# Patient Record
Sex: Female | Born: 1985 | Race: Black or African American | Hispanic: No | Marital: Single | Smoking: Former smoker
Health system: Southern US, Community
[De-identification: ages and names within clinical notes are randomized; demographics above are authoritative.]

## PROBLEM LIST (undated history)

## (undated) ENCOUNTER — Inpatient Hospital Stay: Payer: Self-pay

## (undated) DIAGNOSIS — F329 Major depressive disorder, single episode, unspecified: Secondary | ICD-10-CM

## (undated) DIAGNOSIS — F32A Depression, unspecified: Secondary | ICD-10-CM

## (undated) DIAGNOSIS — O169 Unspecified maternal hypertension, unspecified trimester: Secondary | ICD-10-CM

## (undated) DIAGNOSIS — J45909 Unspecified asthma, uncomplicated: Secondary | ICD-10-CM

## (undated) HISTORY — PX: NO PAST SURGERIES: SHX2092

## (undated) HISTORY — PX: OTHER SURGICAL HISTORY: SHX169

---
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
## 2007-08-01 ENCOUNTER — Emergency Department: Payer: Self-pay | Admitting: Emergency Medicine

## 2008-09-04 ENCOUNTER — Emergency Department: Payer: Self-pay | Admitting: Emergency Medicine

## 2009-10-09 LAB — OB RESULTS CONSOLE VARICELLA ZOSTER ANTIBODY, IGG: Varicella: IMMUNE

## 2009-10-09 LAB — OB RESULTS CONSOLE RUBELLA ANTIBODY, IGM: Rubella: IMMUNE

## 2009-10-09 LAB — SICKLE CELL SCREEN: Sickle Cell Screen: NORMAL

## 2009-10-27 ENCOUNTER — Encounter: Payer: Self-pay | Admitting: Obstetrics and Gynecology

## 2009-11-24 ENCOUNTER — Encounter: Payer: Self-pay | Admitting: Maternal and Fetal Medicine

## 2010-02-02 ENCOUNTER — Encounter: Payer: Self-pay | Admitting: Maternal and Fetal Medicine

## 2010-04-02 ENCOUNTER — Observation Stay: Payer: Self-pay

## 2010-04-20 ENCOUNTER — Inpatient Hospital Stay: Payer: Self-pay

## 2010-11-11 ENCOUNTER — Emergency Department: Payer: Self-pay | Admitting: *Deleted

## 2010-11-11 ENCOUNTER — Emergency Department: Payer: Self-pay | Admitting: Emergency Medicine

## 2010-12-08 ENCOUNTER — Emergency Department: Payer: Self-pay | Admitting: Emergency Medicine

## 2011-09-17 ENCOUNTER — Emergency Department: Payer: Self-pay | Admitting: Unknown Physician Specialty

## 2011-09-17 LAB — PREGNANCY, URINE: Pregnancy Test, Urine: NEGATIVE m[IU]/mL

## 2011-09-17 LAB — COMPREHENSIVE METABOLIC PANEL
Albumin: 4.1 g/dL (ref 3.4–5.0)
Alkaline Phosphatase: 78 U/L (ref 50–136)
Anion Gap: 7 (ref 7–16)
BUN: 15 mg/dL (ref 7–18)
Calcium, Total: 8.8 mg/dL (ref 8.5–10.1)
Glucose: 78 mg/dL (ref 65–99)
SGOT(AST): 25 U/L (ref 15–37)
SGPT (ALT): 22 U/L
Total Protein: 8.4 g/dL — ABNORMAL HIGH (ref 6.4–8.2)

## 2011-09-17 LAB — CBC
HCT: 41.7 % (ref 35.0–47.0)
HGB: 13.6 g/dL (ref 12.0–16.0)
RBC: 5.04 10*6/uL (ref 3.80–5.20)
WBC: 8.3 10*3/uL (ref 3.6–11.0)

## 2011-09-17 LAB — URINALYSIS, COMPLETE
Bacteria: NONE SEEN
Bilirubin,UR: NEGATIVE
Blood: NEGATIVE
Glucose,UR: NEGATIVE mg/dL (ref 0–75)
Ketone: NEGATIVE
Leukocyte Esterase: NEGATIVE
Nitrite: NEGATIVE
Specific Gravity: 1.008 (ref 1.003–1.030)

## 2011-09-17 LAB — ETHANOL
Ethanol %: <0.003 % (ref 0.000–0.080)
Ethanol: <3 mg/dL

## 2011-09-27 ENCOUNTER — Emergency Department: Payer: Self-pay | Admitting: Emergency Medicine

## 2011-12-31 ENCOUNTER — Emergency Department: Payer: Self-pay | Admitting: Emergency Medicine

## 2011-12-31 LAB — URINALYSIS, COMPLETE
Bacteria: NONE SEEN
Glucose,UR: NEGATIVE mg/dL (ref 0–75)
Ketone: NEGATIVE
Specific Gravity: 1.012 (ref 1.003–1.030)
Squamous Epithelial: <1
WBC UR: 1 /HPF (ref 0–5)

## 2011-12-31 LAB — COMPREHENSIVE METABOLIC PANEL
Albumin: 4.1 g/dL (ref 3.4–5.0)
Alkaline Phosphatase: 73 U/L (ref 50–136)
Anion Gap: 7 (ref 7–16)
BUN: 9 mg/dL (ref 7–18)
Chloride: 110 mmol/L — ABNORMAL HIGH (ref 98–107)
Creatinine: 0.75 mg/dL (ref 0.60–1.30)
Glucose: 84 mg/dL (ref 65–99)
Osmolality: 281 (ref 275–301)
SGOT(AST): 21 U/L (ref 15–37)
SGPT (ALT): 19 U/L (ref 12–78)
Sodium: 142 mmol/L (ref 136–145)
Total Protein: 7.6 g/dL (ref 6.4–8.2)

## 2011-12-31 LAB — CBC
HCT: 40.7 % (ref 35.0–47.0)
HGB: 13.2 g/dL (ref 12.0–16.0)
MCH: 26.6 pg (ref 26.0–34.0)
MCHC: 32.5 g/dL (ref 32.0–36.0)
MCV: 82 fL (ref 80–100)
RDW: 14.4 % (ref 11.5–14.5)
WBC: 6.2 10*3/uL (ref 3.6–11.0)

## 2012-09-08 ENCOUNTER — Observation Stay: Payer: Self-pay | Admitting: Obstetrics and Gynecology

## 2012-09-08 LAB — PIH PROFILE
Anion Gap: 8 (ref 7–16)
BUN: 3 mg/dL — ABNORMAL LOW (ref 7–18)
Calcium, Total: 8.4 mg/dL — ABNORMAL LOW (ref 8.5–10.1)
Chloride: 108 mmol/L — ABNORMAL HIGH (ref 98–107)
Co2: 24 mmol/L (ref 21–32)
EGFR (Non-African Amer.): >60
Glucose: 93 mg/dL (ref 65–99)
HCT: 31.7 % — ABNORMAL LOW (ref 35.0–47.0)
HGB: 10.3 g/dL — ABNORMAL LOW (ref 12.0–16.0)
MCH: 26.1 pg (ref 26.0–34.0)
MCHC: 32.4 g/dL (ref 32.0–36.0)
MCV: 80 fL (ref 80–100)
Platelet: 167 10*3/uL (ref 150–440)
RBC: 3.95 10*6/uL (ref 3.80–5.20)
RDW: 14.3 % (ref 11.5–14.5)
Sodium: 140 mmol/L (ref 136–145)
WBC: 16.1 10*3/uL — ABNORMAL HIGH (ref 3.6–11.0)

## 2012-09-08 LAB — PROTEIN / CREATININE RATIO, URINE
Protein, Random Urine: 11 mg/dL (ref 0–12)
Protein/Creat. Ratio: 157 mg/gCREAT (ref 0–200)

## 2012-09-11 ENCOUNTER — Inpatient Hospital Stay: Payer: Self-pay | Admitting: Obstetrics and Gynecology

## 2012-09-11 LAB — CBC WITH DIFFERENTIAL/PLATELET
Basophil #: 0.1 10*3/uL (ref 0.0–0.1)
Basophil %: 0.5 %
Eosinophil #: 0.1 10*3/uL (ref 0.0–0.7)
Eosinophil %: 0.4 %
HCT: 32.6 % — ABNORMAL LOW (ref 35.0–47.0)
HGB: 10.7 g/dL — ABNORMAL LOW (ref 12.0–16.0)
Lymphocyte #: 2.3 10*3/uL (ref 1.0–3.6)
Lymphocyte %: 15.1 %
MCH: 26.2 pg (ref 26.0–34.0)
MCHC: 32.7 g/dL (ref 32.0–36.0)
MCV: 80 fL (ref 80–100)
Monocyte #: 1.2 x10 3/mm — ABNORMAL HIGH (ref 0.2–0.9)
Monocyte %: 7.9 %
Neutrophil #: 11.5 10*3/uL — ABNORMAL HIGH (ref 1.4–6.5)
Neutrophil %: 76.1 %
Platelet: 163 10*3/uL (ref 150–440)
RBC: 4.06 10*6/uL (ref 3.80–5.20)
RDW: 14 % (ref 11.5–14.5)
WBC: 15.1 10*3/uL — ABNORMAL HIGH (ref 3.6–11.0)

## 2012-09-11 LAB — COMPREHENSIVE METABOLIC PANEL
Albumin: 2.7 g/dL — ABNORMAL LOW (ref 3.4–5.0)
Anion Gap: 8 (ref 7–16)
Bilirubin,Total: 0.2 mg/dL (ref 0.2–1.0)
Chloride: 108 mmol/L — ABNORMAL HIGH (ref 98–107)
Creatinine: 0.56 mg/dL — ABNORMAL LOW (ref 0.60–1.30)
Glucose: 79 mg/dL (ref 65–99)
Osmolality: 274 (ref 275–301)
Total Protein: 6.5 g/dL (ref 6.4–8.2)

## 2012-09-12 DIAGNOSIS — O139 Gestational [pregnancy-induced] hypertension without significant proteinuria, unspecified trimester: Secondary | ICD-10-CM

## 2012-09-13 LAB — HEMATOCRIT: HCT: 26.8 % — ABNORMAL LOW (ref 35.0–47.0)

## 2013-09-07 ENCOUNTER — Emergency Department: Payer: Self-pay | Admitting: Emergency Medicine

## 2014-02-15 NOTE — L&D Delivery Note (Signed)
Deliver Note   Date of Delivery:   10/16/2014 Primary OB:   [facility] Gestational Age/EDD: [redacted]w[redacted]d by 11/02/2014, by Ultrasound  Antepartum complications:  OB History    Gravida Para Term Preterm AB TAB SAB Ectopic Multiple Living   0 0 0 0 0 0 2      Delivered By:   Vena Austria MD  Delivery Type:   TSVD Anesthesia:     epidural  Intrapartum complications:  GBS:    Negative Laceration:    None Episiotomy:    none Placenta:    Spontaneous Estimated Blood Loss:  Baby:     Liveborn female  APGAR (1 MIN):  8 APGAR (5 MINS):  9 weight pending   Deliver Details   IOL for worsening CHTN and borderline/declining AFI of 6cm.   At 22:59 female was delivered via TSVD (Presentation: OA ).  APGAR: 8, 9; weight pending.   Placenta status: spontaneous, intact.  Cord: 3 vessel without complications: Marland Kitchen    Mom to postpartum.  Baby to Couplet care / Skin to Skin.

## 2014-03-26 LAB — HM PAP SMEAR: HM Pap smear: NEGATIVE

## 2014-03-26 LAB — OB RESULTS CONSOLE RPR: RPR: NONREACTIVE

## 2014-03-26 LAB — OB RESULTS CONSOLE VARICELLA ZOSTER ANTIBODY, IGG: Varicella: IMMUNE

## 2014-03-26 LAB — OB RESULTS CONSOLE ABO/RH: RH Type: POSITIVE

## 2014-03-26 LAB — OB RESULTS CONSOLE RUBELLA ANTIBODY, IGM: RUBELLA: IMMUNE

## 2014-03-26 LAB — OB RESULTS CONSOLE GC/CHLAMYDIA
Chlamydia: NEGATIVE
GC PROBE AMP, GENITAL: NEGATIVE

## 2014-03-26 LAB — OB RESULTS CONSOLE HIV ANTIBODY (ROUTINE TESTING): HIV: NONREACTIVE

## 2014-03-26 LAB — OB RESULTS CONSOLE HEPATITIS B SURFACE ANTIGEN: Hepatitis B Surface Ag: NEGATIVE

## 2014-03-27 ENCOUNTER — Ambulatory Visit: Payer: Self-pay | Admitting: Family Medicine

## 2014-04-12 DIAGNOSIS — Z6831 Body mass index (BMI) 31.0-31.9, adult: Secondary | ICD-10-CM | POA: Insufficient documentation

## 2014-04-12 DIAGNOSIS — E669 Obesity, unspecified: Secondary | ICD-10-CM | POA: Insufficient documentation

## 2014-04-22 ENCOUNTER — Encounter: Payer: Self-pay | Admitting: Obstetrics and Gynecology

## 2014-04-30 ENCOUNTER — Emergency Department: Payer: Self-pay | Admitting: Emergency Medicine

## 2014-05-27 ENCOUNTER — Encounter
Admit: 2014-05-27 | Disposition: A | Discharge status: Home / Self Care | Payer: Self-pay | Attending: Maternal & Fetal Medicine | Admitting: Maternal & Fetal Medicine

## 2014-06-16 NOTE — Consult Note (Signed)
Referral Information:  Reason for Referral pregnant with h/o chronic hypertension, asthma, smoker, anxiety, elevated BMI, h/o pp depression   Referring Physician [facility]   Prenatal Hx 29 yo G3 P2002 [facility] in a committed 10 year relationship pregnant with their third baby now   Past Obstetrical Hx 2012 - spontaneous vaginal delivery 7 lbs - postpartum anxiety and depression required ER visit and meds  2014 spontaneous vaginal delivery 7 lbs induced for hypertension required daily medicaiton for about 3 months then stopped , no postpartum depression   Home Medications: Medication Instructions Status  albuterol CFC free 90 mcg/inh inhalation aerosol  inhaled , As Needed - for Wheezing Active  Pulmicort Flexhaler 90 mcg/inh inhalation powder 1 puff(s) inhaled 2 times a day Active  multivitamin, prenatal 1   once a day Active  Zantac 150 150 mg oral tablet 1  orally once a day (at bedtime) Active   Allergies:   No Known Allergies:   Vital Signs/Notes:  Nursing Vital Signs: **Vital Signs.:   07-Mar-16 09:01  Vital Signs Type Routine  Temperature Temperature (F) 98.4  Celsius 36.8  Pulse Pulse 109  Respirations Respirations 18  Systolic BP Systolic BP 129  Diastolic BP (mmHg) Diastolic BP (mmHg) 76  Mean BP 93  Pulse Ox % Pulse Ox % 99  Pulse Ox Activity Level  At rest  Oxygen Delivery Room Air/ 21 %   Perinatal Consult:  LMP 14-Jan-2014   Past Medical History cont'd 1) Hypertension first occurred during late in her second pregnancy required pp meds (unsure which one took once daily) noted BP bounces around 2) GERD bad in pregnancy - using Zantac now with relief unsure if it aggravates her asthma 3) Asthma since she was little - using inhaler since she was 8 , worse in pregnancy , QVAR has helped her , rarely using albuterol , got flu shot- unsure about pneumovax  4) Anxiety - got bad after last delivery - unable to sleep , frightened to leave her house - she recognized she couldn't  go on like tha and went to ER - Started Klonopin and PAxil switched to Xanax since too sleepy and was eventually able to stop 5 ) Smoker  3/day only buys packs no cartons , partner smokes , considering quitting  6) obesity elevated BMI 35, watching her intake   PSurg Hx negative   FHx metabolic disorders either side   Occupation Mother works at [employer] [address]   Occupation Father together x 10 years , he smokes   Soc Hx single, tobacco, in committed relaitonship   Review Of Systems:  Subjective c/o stuffy nose and dry mouth - worse while pregnant, headaches - occ related to sinus issues   SOB/DOE No    Tolerating Diet Yes    Medications/Allergies Reviewed Medications/Allergies reviewed     Additional Lab/Radiology Notes brief u/s confirmed FHR   Impression/Recommendations:  Impression IUP at 13 5/7 1- Chronic HTN off meds now started after gestational HTN last delivery 2-Asthma better on QVAR, occ albuterol, c/o nasal congestion unable to clear nose occ related to HA , allergic component ,  3-GERD  better on Zantac , unsure if reflux aggravates her asthma 4 -Smoker - light - encouraged her to quit  5 significant h/o postpartum depression and anxiety after first child, managed with second - pt reports recognizing when "things are getting  bad' and knows when she shoudl get help  6 obesity - BMI 35 - pt limiting intake  Recommendations 1-recommended daily baby aspirin 81 mg for her h/o htn to prevent preeclampsia - if aggravates asthma or GERd then d/c  2 - Continue QVAR and Albuterol . I suggested seeing ENT given nasal sxs and HAs to r/o polyp - I suggested use of a Netti pot and flonase if ENT eval negative  3- continue Zantac  4 - pp f/u for depression /anxiety given significant episode after first delivery 5 limit weight gain to 10-15 pounds  6 monthly u/s in third trimester , induction at 39 weeks for h/o HTN , no meds now normotensive - if persistent  elevation above 140/90 and remote form term consider initiation of medicaiton - theoretically some individuals may have asthma exacerbation with labetalol - consider use of procardia if occurs   Plan:  Genetic Counseling no   Prenatal Diagnosis Options Level II US, ordered in 5 weeks   Ultrasound at what gestational ages Monthly > 28 weeks   Antepartum Testing Weekly, Starting at 3034 to 36 weeks   Delivery Mode Vaginal   Delivery at what gestational age [redacted] weeks    Total Time Spent with Patient 30 minutes   >50% of visit spent in couseling/coordination of care yes   Office Use Only 99242  Level 2 (30min) NEW office consult exp prob focused   Coding Description: MATERNAL CONDITIONS/HISTORY INDICATION(S).   Chronic HTN.   HTN - Chronic.   Tobacco use complicating pregnancy.  Electronic Signatures: Rondall AllegraLivingston, Raizy Auzenne Gresham (MD)  (Signed 07-Mar-16 12:16)  Authored: Referral, Home Medications, Allergies, Vital Signs/Notes, Consult, Exam, Lab/Radiology Notes, Impression, Plan, Billing, Coding Description   Last Updated: 07-Mar-16 12:16 by Rondall AllegraLivingston, Mckinnley Cottier Gresham (MD)

## 2014-06-16 NOTE — Consult Note (Signed)
Referral Information:  Reason for Referral 29 yo G3 P2002 at 4530w2d gestation with h/o chronic hypertension, asthma, smoker, anxiety, elevated BMI, h/o pp depression here for anatomy US today and follow-up regarding her BP and nasal congestion.  The patient continues to have unrelenting nasal congestion despite treatment.   Referring Physician [facility]   Past Obstetrical Hx 2012 - spontaneous vaginal delivery 7 lbs - postpartum anxiety and depression required ER visit and meds  2014 spontaneous vaginal delivery 7 lbs induced for hypertension required daily medicaiton for about 3 months then stopped , no postpartum depression   Home Medications: Medication Instructions Status  albuterol CFC free 90 mcg/inh inhalation aerosol  inhaled , As Needed - for Wheezing Active  Pulmicort Flexhaler 90 mcg/inh inhalation powder 1 puff(s) inhaled 2 times a day Active  multivitamin, prenatal 1   once a day Active  aspirin 81 mg oral tablet, chewable 1 tab(s) orally once a day Active   Allergies:   No Known Allergies:   Vital Signs/Notes:  Nursing Vital Signs: **Vital Signs.:   11-Apr-16 10:10  Vital Signs Type Routine  Temperature Temperature (F) 98.8  Celsius 37.1  Pulse Pulse 100  Respirations Respirations 18  Systolic BP Systolic BP 124  Diastolic BP (mmHg) Diastolic BP (mmHg) 70  Mean BP 88  Pulse Ox % Pulse Ox % 99  Pulse Ox Activity Level  At rest  Oxygen Delivery Room Air/ 21 %   Perinatal Consult:  LMP 14-Jan-2014   Past Medical History cont'd 1) Hypertension first occurred during late in her second pregnancy required pp meds (unsure which one took once daily) noted BP bounces around 2) GERD bad in pregnancy - using Zantac now with relief unsure if it aggravates her asthma 3) Asthma since she was little - using inhaler since she was 8 , worse in pregnancy , QVAR has helped her , rarely using albuterol , got flu shot- unsure about pneumovax  4) Anxiety - got bad after last delivery -  unable to sleep , frightened to leave her house - she recognized she couldn't go on like tha and went to ER - Started Klonopin and PAxil switched to Xanax since too sleepy and was eventually able to stop 5 ) Smoker  3/day only buys packs no cartons , partner smokes , considering quitting  6) obesity elevated BMI 35, watching her intake   PSurg Hx negative   FHx metabolic disorders either side   Occupation Mother Works at [employer] [address]   Occupation Father Together x 10 years, he smokes   Soc Hx single, tobacco, in committed relationship   Review Of Systems:  Subjective c/o stuffy nose and dry mouth - worse while pregnant, headaches - occ related to sinus issues   Fever/Chills No   Cough No   Abdominal Pain No   Diarrhea No   Constipation No   Nausea/Vomiting No   SOB/DOE No   Chest Pain No   Dysuria No   Tolerating Diet Yes   Medications/Allergies Reviewed Medications/Allergies reviewed   Exam:  Today's Weight 176lbs;BMI=33    Additional Lab/Radiology Notes US today - normal fetal anatomy, no previa, cx 4.0 cm   Impression/Recommendations:  Impression 29 yo G3 P2002 at 3530w2d gestation with:  1- Chronic HTN - BP stable off meds  2- Asthma stable, still c/o nasal congestion unable to clear nose  3-GERD  4 -Smoker - light  5- History of postpartum depression and anxiety 6- Obesity   Recommendations 1-  Chronic HTN - BP stable off meds  --  On ASA for prevention of preeclampsia --  For close monitoring of BPs --  Fetal surveillance as below --  Next [place] scheduled for growth at 28 weeks here at [facility] --  If the patient develops a persistent elevation above 140/90 and remote form term consider initiation of medication. Theoretically, some individuals may have asthma exacerbation with labetalol - consider use of Procardia. 2- Asthma stable, still c/o nasal congestion unable to clear nose  -- Dr. Leatha Gilding recommended the patient  be referred to ENT.  She can be seen at [facility] with referral from [facility] which we will secure today  3- GERD - continue Zantac  4 - Smoker - previously encouraged to quit 5- History of postpartum depression and anxiety - for postpartum referral and follow-up 6- Obesity - previously counseled to limit weight gain to 10-15 lbs; Fetal surveillance as outlined below.   Plan:  Genetic Counseling no   Prenatal Diagnosis Options Level II [place], today; see report   Ultrasound at what gestational ages Monthly > 28 weeks   Antepartum Testing Weekly, Starting at 37 to 65 weeks   Delivery Mode Vaginal   Delivery at what gestational age [redacted] weeks    Total Time Spent with Patient 30 minutes   >50% of visit spent in couseling/coordination of care yes   Office Use Only 99214  Office Visit Level 4 ( ) EST detailed office/outpt   Coding Description: MATERNAL CONDITIONS/HISTORY INDICATION(S).   Chronic HTN.   HTN - Chronic.   Tobacco use complicating pregnancy.  Electronic Signatures: Lady Deutscher (MD)  (Signed 11-Apr-16 11:36)  Authored: Referral, Home Medications, Allergies, Vital Signs/Notes, Consult, Exam, Lab/Radiology Notes, Impression, Plan, Billing, Coding Description   Last Updated: 11-Apr-16 11:36 by Lady Deutscher (MD)

## 2014-06-25 NOTE — H&P (Signed)
L&D Evaluation:  History Expanded:  HPI 29 yo G2P1001at 603w2d gestational age by 7 week ultrasound. She presents from [facility] today for elevated BPs 138/102 and 130/100.  She was also sent over from clinic on 7/25 to L&D for clinic BPs of 136/100 and 148/100.  On L&D 3 days ago her BPs were in the normal range and her preeclampsia labs were all normal.  She presents today with a 5-day history of headaches relieved by Tylenol.  She notes no visual disturbances and no RUQ pain. She notes positive fetal movement, denies leakage of fluid and vaginal bleeding. She notes occasional contractions, but is quite vague about the frequency of her contractions. Her pregnancy has been complicated by EtOH use, exercise-induced asthma.   Gravida 2   Term 1   PreTerm 0   Abortion 0   Living 1   Blood Type (Maternal) O positive   Group B Strep Results Maternal (Result >5wks must be treated as unknown) negative  08/31/12   Maternal HIV Negative   Maternal Syphilis Ab Nonreactive   Maternal Varicella Immune   Rubella Results (Maternal) immune   Maternal [facility]Dap Immune   [facility]   Patient's Medical History 1) Exercise induced asthma, 2) EtOH abuse   Patient's Surgical History none   Medications Pre Natal Vitamins   Allergies NKDA   Social History tobacco  EtOH   Family History Non-Contributory   ROS:  ROS All systems were reviewed.  HEENT, CNS, GI, GU, Respiratory, CV, Renal and Musculoskeletal systems were found to be normal., x10 reviewed unless noted in HPI   Exam:  Vital Signs BP 117-155/68-97, T98.41F (37C), P86, RR18   Urine Protein negative dipstick   General no apparent distress   Mental Status clear   Chest clear   Heart normal sinus rhythm   Abdomen gravid, non-tender   Estimated Fetal Weight appropriate for gestational age, 7.5lbs   Back no CVAT   Edema no edema   Reflexes 2+   Pelvic 3cm per [facility]   Mebranes Intact   FHT normal rate with no decels    FHT Description 135/mod var/+accels/no decels   Ucx none   Impression:  Impression Gestational hypertension at 8238 weeks gestation age   Plan:  Comments -admit for induction given gestational hypertension (see "Timing of Indicated late-Preterm and Early-Term Birth" Obstetrics and Gynecology, August 2011, Vol 118, No 2, part 1) - Fetal well being reassuring overall - repeat preeclampsia labs on admission   Electronic Signatures: Conard NovakJackson, Brogen Duell D (MD)  (Signed 28-Jul-14 17:56)  Authored: L&D Evaluation   Last Updated: 28-Jul-14 17:56 by Conard NovakJackson, Stana Bayon D (MD)

## 2014-09-09 ENCOUNTER — Ambulatory Visit
Admission: RE | Admit: 2014-09-09 | Discharge: 2014-09-09 | Disposition: A | Discharge status: Home / Self Care | Payer: Medicaid Other | Source: Ambulatory Visit | Attending: Obstetrics and Gynecology | Admitting: Obstetrics and Gynecology

## 2014-09-09 VITALS — BP 128/81 | HR 121 | Temp 98.4°F | Ht 62.0 in | Wt 195.0 lb

## 2014-09-09 DIAGNOSIS — O163 Unspecified maternal hypertension, third trimester: Secondary | ICD-10-CM | POA: Diagnosis not present

## 2014-09-09 DIAGNOSIS — Z36 Encounter for antenatal screening of mother: Secondary | ICD-10-CM | POA: Diagnosis present

## 2014-09-09 DIAGNOSIS — Z3A34 34 weeks gestation of pregnancy: Secondary | ICD-10-CM | POA: Insufficient documentation

## 2014-09-09 DIAGNOSIS — O99213 Obesity complicating pregnancy, third trimester: Secondary | ICD-10-CM | POA: Diagnosis present

## 2014-09-09 HISTORY — DX: Depression, unspecified: F32.A

## 2014-09-09 HISTORY — DX: Major depressive disorder, single episode, unspecified: F32.9

## 2014-09-09 HISTORY — DX: Unspecified asthma, uncomplicated: J45.909

## 2014-09-09 LAB — US OB FOLLOW UP

## 2014-09-23 ENCOUNTER — Encounter: Payer: Self-pay | Admitting: Certified Nurse Midwife

## 2014-09-23 ENCOUNTER — Observation Stay
Admission: RE | Admit: 2014-09-23 | Discharge: 2014-09-23 | Disposition: A | Discharge status: Home / Self Care | Payer: Medicaid Other | Attending: Obstetrics and Gynecology | Admitting: Obstetrics and Gynecology

## 2014-09-23 DIAGNOSIS — O10913 Unspecified pre-existing hypertension complicating pregnancy, third trimester: Principal | ICD-10-CM | POA: Insufficient documentation

## 2014-09-23 DIAGNOSIS — Z3A34 34 weeks gestation of pregnancy: Secondary | ICD-10-CM | POA: Diagnosis not present

## 2014-09-23 DIAGNOSIS — I10 Essential (primary) hypertension: Secondary | ICD-10-CM | POA: Diagnosis present

## 2014-09-23 HISTORY — DX: Unspecified maternal hypertension, unspecified trimester: O16.9

## 2014-09-23 NOTE — Progress Notes (Signed)
L&D Triage Note   29 year old G3 2002 with EDC=11/02/2014 by a 8wk4d ultrasound presents to L&D at 34 2/7 weeks for a NST due to chronic hypertension. First trimester blood pressures=120-134/90-96. Prenatal care at [facility] remarkable for history of GHTN with G2 , asthma, an early elevated hemoglobin A1C with a normal 3hr GTT x 2, a lapse in care from April to July due to transportation issues, smoking, anxiety, and obesity (35 starting BMI, has gained 21#). Also hx of pp anxiety and depression after G1. [place] for growth 09/09/2014 at [place] [facility] with EFWat 58th% and normal AFI. LABS: O POS/ VI/ MMR x2  ROS: intermittent left temporal/ frontal headaches sometimes relieved with Tylenol Denies CP, SOB, VB, visual changes, abdominal pain  Meds: QVAR 2 puffs qd, Zantac 150 mgm BID prn, PNV daily, Claritin 10 mgm prn, Tylenol prn  NKDA  Exam: BP 135/86 mmHg  Pulse 107  Temp(Src) 98.9 F (37.2 C) (Oral)  Resp 16  LMP 01/14/2014  General : No acute distress Abdomen: NT, soft Toco: occasional ctx FHR: 145- 150 with accelerations to 160s to 170s, moderate variability  A: IUP at 34 2/7 weeks with CHTN, not on meds Reactive NST  P: FU as scheduled at [facility] this week Repeat NST weekly here in L&D Repeat US for growth at [facility] in 2 weeks Needs IOL at 39 weeks if blood pressures, labs, and antepartum testing is reassuring  Ryker Pherigo, CNM

## 2014-09-23 NOTE — OB Triage Note (Signed)
Patient here for scheduled NST for elevated BP at [facility].

## 2014-09-30 ENCOUNTER — Observation Stay
Admission: RE | Admit: 2014-09-30 | Discharge: 2014-09-30 | Disposition: A | Discharge status: Home / Self Care | Payer: Medicaid Other | Attending: Advanced Practice Midwife | Admitting: Advanced Practice Midwife

## 2014-09-30 DIAGNOSIS — O26893 Other specified pregnancy related conditions, third trimester: Secondary | ICD-10-CM | POA: Diagnosis not present

## 2014-09-30 DIAGNOSIS — Z3A35 35 weeks gestation of pregnancy: Secondary | ICD-10-CM | POA: Diagnosis not present

## 2014-09-30 NOTE — Discharge Summary (Signed)
NST note:   29 yo G3 P2002 with EDD of 11/02/14, presents for NST for HTN  FHR: category 1 tracing, baseline 140 with moderate variability, + accels, no decels  Toco: no contractions noted  FHR strip was reviewed by myself after pt's discharge.   Continue routine testing.

## 2014-09-30 NOTE — OB Triage Note (Signed)
Tina Parrish here for weekly NST. No complaints since last visit. +FM.

## 2014-10-07 ENCOUNTER — Ambulatory Visit
Admission: RE | Admit: 2014-10-07 | Discharge: 2014-10-07 | Disposition: A | Discharge status: Home / Self Care | Payer: Medicaid Other | Source: Ambulatory Visit | Attending: Maternal & Fetal Medicine | Admitting: Maternal & Fetal Medicine

## 2014-10-07 ENCOUNTER — Observation Stay
Admission: RE | Admit: 2014-10-07 | Discharge: 2014-10-07 | Disposition: A | Discharge status: Home / Self Care | Payer: Medicaid Other | Attending: Obstetrics & Gynecology | Admitting: Obstetrics & Gynecology

## 2014-10-07 DIAGNOSIS — Z3A36 36 weeks gestation of pregnancy: Secondary | ICD-10-CM | POA: Diagnosis not present

## 2014-10-07 DIAGNOSIS — O163 Unspecified maternal hypertension, third trimester: Secondary | ICD-10-CM | POA: Diagnosis not present

## 2014-10-07 NOTE — Progress Notes (Signed)
             Expand All Collapse All   L&D Triage Note   29 year old G3 2002 with EDC=11/02/2014 by a 8wk4d ultrasound presents to L&D at 36 2/7 weeks for a NST due to chronic hypertension. First trimester blood pressures=120-134/90-96. Prenatal care at [facility] remarkable for history of GHTN with G2 , asthma, an early elevated hemoglobin A1C with a normal 3hr GTT x 2, a lapse in care from April to July due to transportation issues, smoking, anxiety, and obesity (35 starting BMI, has gained 21#). Also hx of pp anxiety and depression after G1. [place] for growth 09/09/2014 at [place] [facility] with EFWat 58th% and normal AFI. Has another [facility] scan scheduled [facility].  LABS: O POS/ VI/ MMR x2  ROS: intermittent left temporal/ frontal headaches sometimes relieved with Tylenol Denies CP, SOB, VB, visual changes, abdominal pain  Meds: QVAR 2 puffs qd, Zantac 150 mgm BID prn, PNV daily, Claritin 10 mgm prn, Tylenol prn  NKDA  Exam: BP 127/78 mmHg  Pulse 98  LMP 01/14/2014 General : No acute distress Abdomen: NT, soft Toco: occasional ctx FHR: 135 with accelerations to 150s to 180s, moderate variability, no decelerations Toco: some uterine irritability  A: IUP at 36 2/7 weeks with CHTN, not on meds Reactive NST  P: FU as scheduled at [facility] this week Repeat NST weekly here in L&D Repeat US for growth at [facility] today Needs IOL at 39 weeks if blood pressures, labs, and antepartum testing is reassuring  Ciani Rutten, CNM

## 2014-10-07 NOTE — Progress Notes (Deleted)
L&D Progress Note  S: Feels flushed from the magnesium sulfate. Rates contraction pain 8/10 Desires pain medication  O:BP 127/78 mmHg  Pulse 98  LMP 01/14/2014  Smiling and talking through some contractions, breathing thru others FHR 140s with accelerations, moderate variability Toco: contractions q 4-6 min apart Cervix: unchanged [place]: difficulty getting EFW due to low station of fetal head. EFW3#15 oz (1775 gms)  A: IUP at 33 weeks with preterm labor No cervical change since admission  P: Continue magnesium sulfate I&O Fentanyl for pain  Karina Lenderman, CNM

## 2014-10-07 NOTE — OB Triage Note (Signed)
REcvd for scheduled NST.  EFM applied and teaching done concerning the NST procedure.  Verbalized and demonstrated understanding.

## 2014-10-11 ENCOUNTER — Observation Stay
Admission: EM | Admit: 2014-10-11 | Discharge: 2014-10-11 | Disposition: A | Discharge status: Home / Self Care | Payer: Medicaid Other | Attending: Obstetrics & Gynecology | Admitting: Obstetrics & Gynecology

## 2014-10-11 DIAGNOSIS — O99333 Smoking (tobacco) complicating pregnancy, third trimester: Secondary | ICD-10-CM | POA: Insufficient documentation

## 2014-10-11 DIAGNOSIS — O1493 Unspecified pre-eclampsia, third trimester: Secondary | ICD-10-CM | POA: Diagnosis present

## 2014-10-11 DIAGNOSIS — Z3A37 37 weeks gestation of pregnancy: Secondary | ICD-10-CM | POA: Insufficient documentation

## 2014-10-11 LAB — COMPREHENSIVE METABOLIC PANEL
ALBUMIN: 3 g/dL — AB (ref 3.5–5.0)
ALK PHOS: 110 U/L (ref 38–126)
ALT: 15 U/L (ref 14–54)
AST: 27 U/L (ref 15–41)
Anion gap: 10 (ref 5–15)
BUN: <5 mg/dL — ABNORMAL LOW (ref 6–20)
CHLORIDE: 109 mmol/L (ref 101–111)
CO2: 20 mmol/L — AB (ref 22–32)
Calcium: 8.9 mg/dL (ref 8.9–10.3)
Creatinine, Ser: 0.56 mg/dL (ref 0.44–1.00)
GFR calc non Af Amer: >60 mL/min (ref >60)
GLUCOSE: 92 mg/dL (ref 65–99)
POTASSIUM: 3.5 mmol/L (ref 3.5–5.1)
SODIUM: 139 mmol/L (ref 135–145)
Total Bilirubin: 0.4 mg/dL (ref 0.3–1.2)
Total Protein: 6.5 g/dL (ref 6.5–8.1)

## 2014-10-11 LAB — CBC
HCT: 32.8 % — ABNORMAL LOW (ref 35.0–47.0)
HEMOGLOBIN: 10.5 g/dL — AB (ref 12.0–16.0)
MCH: 25.4 pg — ABNORMAL LOW (ref 26.0–34.0)
MCHC: 31.9 g/dL — AB (ref 32.0–36.0)
MCV: 79.5 fL — ABNORMAL LOW (ref 80.0–100.0)
PLATELETS: 175 10*3/uL (ref 150–440)
RBC: 4.13 MIL/uL (ref 3.80–5.20)
RDW: 15.1 % — AB (ref 11.5–14.5)
WBC: 14.1 10*3/uL — ABNORMAL HIGH (ref 3.6–11.0)

## 2014-10-11 LAB — PROTEIN / CREATININE RATIO, URINE
Creatinine, Urine: 64 mg/dL
PROTEIN CREATININE RATIO: 0.11 mg/mg{creat} (ref 0.00–0.15)
Total Protein, Urine: 7 mg/dL

## 2014-10-11 NOTE — H&P (Signed)
Obstetric History and Physical  Tina Parrish is a 29 y.o. [id number] with Estimated Date of Delivery: 11/02/14 per 8 wk [place] who presents at [redacted]w[redacted]d presenting for elevated BP at [facility] today. [facility] Growth [place] 8[place]22: EFW 6# 12 oz (70%) AFI 7.2 cm.  Patient states she has been having no contractions, no vaginal bleeding, intact membranes, with active fetal movement.  Reports headache off and on for several weeks and occasional spots in her vision x 1-2 weeks.   Prenatal Course Source of Care: [facility] with onset of care at 10 weeks Pregnancy complications or risks: CHTN, asthma, tobacco use and lapse in care x 3 months d/t transportation difficulty.   She plans to bottle feed She desires Nexplanon for postpartum contraception.   Prenatal labs and studies: ABO, Rh: O+  Antibody: neg Rubella: MMR x 2 Varicella: Immune RPR:  NR HBsAg:  neg HIV: NR GC/CT: neg/neg GBS: neg 1 hr Glucola: 3 hour WNL      Prenatal Transfer Tool   Past Medical History  Diagnosis Date  . Asthma   . Chronic hypertension - per elevated diastolic BP prior to 20 weeks   . Depression     Postpartum  . Hypertension affecting pregnancy     Past Surgical History  Procedure Laterality Date  . No past surgeries      OB History  Gravida Para Term Preterm AB SAB TAB Ectopic Multiple Living  '3 2 2 ' 0 0 0 0 0 0 2    # Outcome Date GA Lbr Len/2nd Weight Sex Delivery Anes PTL Lv  3 Current           2 Term 09/12/12 32w3d7 lb 8 oz (3.402 kg) F Vag-Spont  N Y     Complications: Gestational hypertension  1 Term 04/21/10 408w0d lb 7.9 oz (3.4 kg) M Vag-Spont  N Y      Social History   Social History  . Marital Status: Single    Spouse Name: N/A  . Number of Children: N/A  . Years of Education: N/A   Social History Main Topics  . Smoking status: Light Tobacco Smoker -- 0.25 packs/day  . Smokeless tobacco: Never Used  . Alcohol Use: No  . Drug Use: No  .  Sexual Activity: Yes   Other Topics Concern  . Not on file   Social History Narrative    No family history on file.  Prescriptions prior to admission  Medication Sig Dispense Refill Last Dose  . albuterol (PROVENTIL HFA;VENTOLIN HFA) 108 (90 BASE) MCG/ACT inhaler Inhale 1-2 puffs into the lungs every 6 (six) hours as needed for wheezing or shortness of breath.   Taking  . aspirin 81 MG tablet Take 81 mg by mouth daily.   Taking  . budesonide (PULMICORT) 180 MCG/ACT inhaler Inhale 1 puff into the lungs 2 (two) times daily.   Taking  . Prenatal Vit-Fe Fumarate-FA (PRENATAL MULTIVITAMIN) TABS tablet Take 1 tablet by mouth daily at 12 noon.   Taking  Zantac 150 mg BID Claritin prn   No Known Allergies  Review of Systems: Negative except for what is mentioned in HPI.  Physical Exam: BP 130/85, 134/95, 136/93, 136/84, 129/91, 136/86 mmHg  Pulse 104  LMP 01/14/2014 GENERAL: Well-developed, well-nourished female in no acute distress.  LUNGS: Clear to auscultation bilaterally.  HEART: Regular rate and rhythm. ABDOMEN: Soft, nontender, nondistended, gravid. EXTREMITIES: Nontender, no  edema Cervical Exam: deferred, per pt she was closed on Wednesday at office visit FHT: Category: 1 Baseline rate 140 bpm   Variability moderate  Accelerations present   Decelerations none Contractions: rare   Pertinent Labs/Studies:   Results for orders placed or performed during the hospital encounter of 10/11/14 (from the past 24 hour(s))  CBC     Status: Abnormal   Collection Time: 10/11/14  2:31 PM  Result Value Ref Range   WBC 14.1 (H) 3.6 - 11.0 K/uL   RBC 4.13 3.80 - 5.20 MIL/uL   Hemoglobin 10.5 (L) 12.0 - 16.0 g/dL   HCT 32.8 (L) 35.0 - 47.0 %   MCV 79.5 (L) 80.0 - 100.0 fL   MCH 25.4 (L) 26.0 - 34.0 pg   MCHC 31.9 (L) 32.0 - 36.0 g/dL   RDW 15.1 (H) 11.5 - 14.5 %   Platelets 175 150 - 440 K/uL  Comprehensive metabolic panel     Status: Abnormal   Collection Time: 10/11/14  2:31 PM   Result Value Ref Range   Sodium 139 135 - 145 mmol/L   Potassium 3.5 3.5 - 5.1 mmol/L   Chloride 109 101 - 111 mmol/L   CO2 20 (L) 22 - 32 mmol/L   Glucose, Bld 92 65 - 99 mg/dL   BUN <5 (L) 6 - 20 mg/dL   Creatinine, Ser 0.56 0.44 - 1.00 mg/dL   Calcium 8.9 8.9 - 10.3 mg/dL   Total Protein 6.5 6.5 - 8.1 g/dL   Albumin 3.0 (L) 3.5 - 5.0 g/dL   AST 27 15 - 41 U/L   ALT 15 14 - 54 U/L   Alkaline Phosphatase 110 38 - 126 U/L   Total Bilirubin 0.4 0.3 - 1.2 mg/dL   GFR calc non Af Amer >60 >60 mL/min   GFR calc Af Amer >60 >60 mL/min   Anion gap 10 5 - 15   PC Ratio pending  Assessment : IUP at 79w6dwith CHTN vs pre-eclampsia  Plan: Awaiting PC Ratio, if WNL will have pt return for NST/AFI on 8/22. IOL at 38 weeks per ACOG guidelines on management of CHTN.

## 2014-10-11 NOTE — Discharge Summary (Signed)
PC Ratio 110. Pt to be discharged home.  Return for NST/AFI in [facility] on 8[facility]29 Return to [facility] on 8/30 IOL at 8 pm on 9/3 at 38 wks, for CHTN, no meds

## 2014-10-14 ENCOUNTER — Encounter: Payer: Self-pay | Admitting: *Deleted

## 2014-10-14 ENCOUNTER — Inpatient Hospital Stay
Admission: RE | Admit: 2014-10-14 | Discharge: 2014-10-18 | DRG: 774 | Disposition: A | Discharge status: Home / Self Care | Payer: Medicaid Other | Source: Ambulatory Visit | Attending: Obstetrics and Gynecology | Admitting: Obstetrics and Gynecology

## 2014-10-14 ENCOUNTER — Inpatient Hospital Stay: Payer: Medicaid Other

## 2014-10-14 DIAGNOSIS — R030 Elevated blood-pressure reading, without diagnosis of hypertension: Secondary | ICD-10-CM

## 2014-10-14 DIAGNOSIS — I10 Essential (primary) hypertension: Secondary | ICD-10-CM | POA: Diagnosis present

## 2014-10-14 DIAGNOSIS — O99214 Obesity complicating childbirth: Secondary | ICD-10-CM | POA: Diagnosis present

## 2014-10-14 DIAGNOSIS — Z79899 Other long term (current) drug therapy: Secondary | ICD-10-CM

## 2014-10-14 DIAGNOSIS — Z6837 Body mass index (BMI) 37.0-37.9, adult: Secondary | ICD-10-CM

## 2014-10-14 DIAGNOSIS — IMO0001 Reserved for inherently not codable concepts without codable children: Secondary | ICD-10-CM

## 2014-10-14 DIAGNOSIS — O9952 Diseases of the respiratory system complicating childbirth: Secondary | ICD-10-CM | POA: Diagnosis present

## 2014-10-14 DIAGNOSIS — O10919 Unspecified pre-existing hypertension complicating pregnancy, unspecified trimester: Secondary | ICD-10-CM | POA: Diagnosis present

## 2014-10-14 DIAGNOSIS — J45909 Unspecified asthma, uncomplicated: Secondary | ICD-10-CM | POA: Diagnosis present

## 2014-10-14 DIAGNOSIS — O1092 Unspecified pre-existing hypertension complicating childbirth: Principal | ICD-10-CM | POA: Diagnosis present

## 2014-10-14 DIAGNOSIS — O0933 Supervision of pregnancy with insufficient antenatal care, third trimester: Secondary | ICD-10-CM | POA: Diagnosis not present

## 2014-10-14 DIAGNOSIS — F17210 Nicotine dependence, cigarettes, uncomplicated: Secondary | ICD-10-CM | POA: Diagnosis present

## 2014-10-14 DIAGNOSIS — Z3A37 37 weeks gestation of pregnancy: Secondary | ICD-10-CM | POA: Diagnosis present

## 2014-10-14 DIAGNOSIS — Z7982 Long term (current) use of aspirin: Secondary | ICD-10-CM

## 2014-10-14 DIAGNOSIS — O99334 Smoking (tobacco) complicating childbirth: Secondary | ICD-10-CM | POA: Diagnosis present

## 2014-10-14 DIAGNOSIS — Z7951 Long term (current) use of inhaled steroids: Secondary | ICD-10-CM | POA: Diagnosis not present

## 2014-10-14 LAB — COMPREHENSIVE METABOLIC PANEL
ALBUMIN: 3 g/dL — AB (ref 3.5–5.0)
ALK PHOS: 122 U/L (ref 38–126)
ALT: 14 U/L (ref 14–54)
ANION GAP: 6 (ref 5–15)
AST: 27 U/L (ref 15–41)
BUN: <5 mg/dL — ABNORMAL LOW (ref 6–20)
CALCIUM: 9.1 mg/dL (ref 8.9–10.3)
CO2: 22 mmol/L (ref 22–32)
Chloride: 108 mmol/L (ref 101–111)
Creatinine, Ser: 0.55 mg/dL (ref 0.44–1.00)
GFR calc non Af Amer: >60 mL/min (ref >60)
Glucose, Bld: 67 mg/dL (ref 65–99)
POTASSIUM: 3.6 mmol/L (ref 3.5–5.1)
SODIUM: 136 mmol/L (ref 135–145)
TOTAL PROTEIN: 6.3 g/dL — AB (ref 6.5–8.1)
Total Bilirubin: 0.3 mg/dL (ref 0.3–1.2)

## 2014-10-14 LAB — CBC
HCT: 34.2 % — ABNORMAL LOW (ref 35.0–47.0)
HEMOGLOBIN: 10.7 g/dL — AB (ref 12.0–16.0)
MCH: 25 pg — AB (ref 26.0–34.0)
MCHC: 31.4 g/dL — AB (ref 32.0–36.0)
MCV: 79.5 fL — ABNORMAL LOW (ref 80.0–100.0)
Platelets: 186 10*3/uL (ref 150–440)
RBC: 4.3 MIL/uL (ref 3.80–5.20)
RDW: 14.9 % — AB (ref 11.5–14.5)
WBC: 12.6 10*3/uL — ABNORMAL HIGH (ref 3.6–11.0)

## 2014-10-14 LAB — TYPE AND SCREEN
ABO/RH(D): O POS
ANTIBODY SCREEN: NEGATIVE

## 2014-10-14 LAB — URIC ACID: URIC ACID, SERUM: 5.8 mg/dL (ref 2.3–6.6)

## 2014-10-14 MED ORDER — MISOPROSTOL 25 MCG QUARTER TABLET
25.0000 ug | ORAL_TABLET | ORAL | Status: DC
  Administered 2014-10-14 – 2014-10-15 (×5): 25 ug via ORAL
  Filled 2014-10-14 (×5): qty 1

## 2014-10-14 MED ORDER — ACETAMINOPHEN 325 MG PO TABS
650.0000 mg | ORAL_TABLET | Freq: Four times a day (QID) | ORAL | Status: DC | PRN
  Administered 2014-10-14: 650 mg via ORAL

## 2014-10-14 MED ORDER — CALCIUM CARBONATE ANTACID 500 MG PO CHEW
2.0000 | CHEWABLE_TABLET | Freq: Four times a day (QID) | ORAL | Status: DC | PRN
  Administered 2014-10-14 – 2014-10-16 (×2): 400 mg via ORAL
  Filled 2014-10-14 (×2): qty 2

## 2014-10-14 MED ORDER — OXYTOCIN BOLUS FROM INFUSION: 500.0000 mL | INTRAVENOUS | Status: DC

## 2014-10-14 MED ORDER — TERBUTALINE SULFATE 1 MG/ML IJ SOLN: 0.2500 mg | Freq: Once | INTRAMUSCULAR | Status: DC | PRN

## 2014-10-14 MED ORDER — ACETAMINOPHEN 325 MG PO TABS
ORAL_TABLET | ORAL | Status: AC
  Administered 2014-10-14: 650 mg via ORAL
  Filled 2014-10-14: qty 2

## 2014-10-14 MED ORDER — ZOLPIDEM TARTRATE 5 MG PO TABS
5.0000 mg | ORAL_TABLET | Freq: Every evening | ORAL | Status: DC | PRN
  Administered 2014-10-14 – 2014-10-15 (×2): 5 mg via ORAL
  Filled 2014-10-14 (×2): qty 1

## 2014-10-14 MED ORDER — LACTATED RINGERS IV SOLN
INTRAVENOUS | Status: DC
  Administered 2014-10-15 – 2014-10-16 (×4): via INTRAVENOUS

## 2014-10-14 MED ORDER — LACTATED RINGERS IV SOLN: 500.0000 mL | INTRAVENOUS | Status: DC | PRN

## 2014-10-14 MED ORDER — OXYTOCIN 40 UNITS IN LACTATED RINGERS INFUSION - SIMPLE MED: 62.5000 mL/h | INTRAVENOUS | Status: DC

## 2014-10-14 MED ORDER — DEXTROSE 5 % IV SOLN
1.0000 g | Freq: Once | INTRAVENOUS | Status: AC
  Administered 2014-10-14: 1 g via INTRAVENOUS
  Filled 2014-10-14: qty 10

## 2014-10-14 MED ORDER — FAMOTIDINE 20 MG PO TABS
20.0000 mg | ORAL_TABLET | Freq: Every day | ORAL | Status: DC
  Administered 2014-10-16: 20 mg via ORAL
  Filled 2014-10-14: qty 1

## 2014-10-14 NOTE — H&P (Signed)
Obstetric History and Physical  Tina Parrish is a 29 y.o. T6O0600 with Estimated Date of Delivery: 11/02/14 per 8 wk [place] who presents at [redacted]w[redacted]d presenting for scheduled NST and AFI  For CHTN, no meds. Growth [place] 8/22: EFW 6# 12 oz (70%) AFI 7.2 cm.  Patient states she has been having no contractions, no vaginal bleeding, intact membranes, with active fetal movement.  Reports headache off and on for several weeks and occasional spots in her vision x 1-2 weeks.   Prenatal Course Source of Care: [facility] with onset of care at 10 weeks Pregnancy complications or risks: CHTN, asthma, tobacco use and lapse in care x 3 months d/t transportation difficulty.   She plans to bottle feed She desires Nexplanon for postpartum contraception.   Prenatal labs and studies: ABO, Rh: O+  Antibody: neg Rubella: MMR x 2 Varicella: Immune RPR:  NR HBsAg:  neg HIV: NR GC/CT: neg/neg GBS: neg 1 hr Glucola: 3 hour WNL      Prenatal Transfer Tool   Past Medical History  Diagnosis Date  . Asthma   . Chronic hypertension - per elevated diastolic BP prior to 20 weeks   . Depression     Postpartum  . Hypertension affecting pregnancy     Past Surgical History  Procedure Laterality Date  . No past surgeries      OB History  Gravida Para Term Preterm AB SAB TAB Ectopic Multiple Living  _0 0 0 0 0 0 0 2    # Outcome Date GA Lbr Len/2nd Weight Sex Delivery Anes PTL Lv  3 Current           2 Term 09/12/12 [id number] lb 8 oz (3.402 kg) F Vag-Spont  N Y     Complications: Gestational hypertension  1 Term 04/21/10 4071w0d lb 7.9 oz (3.4 kg) M Vag-Spont  N Y      Social History   Social History  . Marital Status: Single    Spouse Name: N/A  . Number of Children: N/A  . Years of Education: N/A   Social History Main Topics  . Smoking status: Light Tobacco Smoker -- 0.25 packs/day  . Smokeless tobacco: Never Used  . Alcohol Use: No  . Drug Use: No  . Sexual Activity: Yes   Other Topics  Concern  . None   Social History Narrative    History reviewed. No pertinent family history.  Prescriptions prior to admission  Medication Sig Dispense Refill Last Dose  . albuterol (PROVENTIL HFA;VENTOLIN HFA) 108 (90 BASE) MCG/ACT inhaler Inhale 1-2 puffs into the lungs every 6 (six) hours as needed for wheezing or shortness of breath.   Taking  . aspirin 81 MG tablet Take 81 mg by mouth daily.   Taking  . budesonide (PULMICORT) 180 MCG/ACT inhaler Inhale 1 puff into the lungs 2 (two) times daily.   Taking  . Prenatal Vit-Fe Fumarate-FA (PRENATAL MULTIVITAMIN) TABS tablet Take 1 tablet by mouth daily at 12 noon.   Taking  Zantac 150 mg BID Claritin prn   No Known Allergies  Review of Systems: Negative except for what is mentioned in HPI.  Physical Exam: BP 144/100, 144/97, 145/92, 136/97, 145/88, 131/89, 129/90, 139/88, 139/78 mmHg  Pulse 97  LMP 01/14/2014 GENERAL: Well-developed, well-nourished female in no acute distress.  LUNGS: Clear to auscultation bilaterally.  HEART: Regular rate and rhythm. ABDOMEN: Soft, nontender, nondistended, gravid. EXTREMITIES: Nontender, no edema Cervical Exam: 1/50/-2 FHT:  Category: 1 Baseline rate 140 bpm   Variability moderate  Accelerations present   Decelerations none Contractions: rare  AFI:  6.1  Assessment : IUP at [redacted]w[redacted]d category 1 fetal tracing  Plan: Spoke with Dr JJeneen Rinkswith [facility]ast[facility]who reviewed [place]ages from last week and this week. Given drop in AFI and slightly more elevated BPs, she recommends proceeding with IOL today. Pt in agreement with plan of management.   Will admit for IOL With Cytotec

## 2014-10-14 NOTE — OB Triage Note (Signed)
Scheduled NST for elevated blood pressure with an AFI.

## 2014-10-15 ENCOUNTER — Encounter: Payer: Self-pay | Admitting: Obstetrics and Gynecology

## 2014-10-15 LAB — URINE DRUG SCREEN, QUALITATIVE (ARMC ONLY)
AMPHETAMINES, UR SCREEN: NOT DETECTED
Barbiturates, Ur Screen: NOT DETECTED
Benzodiazepine, Ur Scrn: NOT DETECTED
COCAINE METABOLITE, UR ~~LOC~~: NOT DETECTED
Cannabinoid 50 Ng, Ur ~~LOC~~: NOT DETECTED
MDMA (Ecstasy)Ur Screen: NOT DETECTED
METHADONE SCREEN, URINE: NOT DETECTED
OPIATE, UR SCREEN: NOT DETECTED
Phencyclidine (PCP) Ur S: NOT DETECTED
Tricyclic, Ur Screen: NOT DETECTED

## 2014-10-15 LAB — PROTEIN / CREATININE RATIO, URINE
Creatinine, Urine: 101 mg/dL
Protein Creatinine Ratio: 0.08 mg/mg{Cre} (ref 0.00–0.15)
Total Protein, Urine: 8 mg/dL

## 2014-10-15 LAB — ABO/RH: ABO/RH(D): O POS

## 2014-10-15 LAB — RPR: RPR: NONREACTIVE

## 2014-10-15 MED ORDER — SODIUM CHLORIDE 0.9 % IJ SOLN
INTRAMUSCULAR | Status: AC
  Filled 2014-10-15: qty 50

## 2014-10-15 NOTE — Progress Notes (Signed)
L&D Note  10/15/2014 - 7:53 AM  28 y.o. Z6X0960 [redacted]w[redacted]d   Ms. Tina Parrish is admitted for IOL for CHTN/GHTN   Subjective:  Rested some overnight, feeling some contractions  Objective:   Filed Vitals:   10/15/14 0140 10/15/14 0540 10/15/14 0543 10/15/14 0710  BP: 130/90 136/99 119/88 117/74  Pulse: 89 98 93 84  Temp:    98.2 F (36.8 C)  TempSrc:    Oral  Resp:        Current Vital Signs 24h Vital Sign Ranges  T 98.2 F (36.8 C) Temp  Avg: 98.2 F (36.8 C)  Min: 98.1 F (36.7 C)  Max: 98.2 F (36.8 C)  BP 117/74 mmHg BP  Min: 106/59  Max: 162/138  HR 84 Pulse  Avg: 94.5  Min: 84  Max: 110  RR 18 Resp  Avg: 18  Min: 18  Max: 18  SaO2     No Data Recorded       24 Hour I/O Current Shift I/O  Time Ins Outs        FHR: category 1 tracing Toco: 7-8 min SVE: 1.5/60/-2 to -3   Assessment :  IUP at [redacted]w[redacted]d, IOL for CHTN/GHTN     Plan:  Next dose of cytotec due about 9:40, may consider foley bulb +/- cytotec   Marta Antu, [place]

## 2014-10-15 NOTE — Progress Notes (Signed)
L&D Note  S: Feeling some contractions. Rates pain of contraction 5/10. Received last Cytotec at 0540   0: 124/65/ 133/75/139/94 General: breathing thru some contractions FHR 130s with accelerations to 150s to 160s, moderate variability, no decelerations Toco: Contractions q2 to 9 minutes apart Cervix: 1.5/40%/-3  A: IUP at 37wk3 days undergoing induction for CHTN  Mild blood pressure elevations  Cat 1 FHR  Little cervical change  P: Will allow to eat light breakfast  25 mcg Cytotec after breakfast  Meia Emley, CNM

## 2014-10-15 NOTE — Progress Notes (Signed)
L&D Note  10/15/2014 - 4:02 PM  28 y.o. Z6X0960 [redacted]w[redacted]d (dated by 8wk u/s). Pregnancy complicated by cHTN, asthma, tobacco abuse, insufficient PNC, normal 3hr glucose testing, h/o PP depression, BMI 37  Ms. Tina Parrish is admitted for decreasing AFIs   Subjective:  No s/s of pre-eclampsia and feels occasional UCs, not painful   Objective:   Filed Vitals:   10/15/14 1104 10/15/14 1305 10/15/14 1412 10/15/14 1522  BP: 129/83 143/78 141/89 135/80  Pulse: 98 101 93 91  Temp: 98.3 F (36.8 C)  98.3 F (36.8 C)   TempSrc: Oral     Resp:        Current Vital Signs 24h Vital Sign Ranges  T 98.3 F (36.8 C) Temp  Avg: 98.2 F (36.8 C)  Min: 98.1 F (36.7 C)  Max: 98.3 F (36.8 C)  BP 135/80 mmHg BP  Min: 106/59  Max: 162/138  HR 91 Pulse  Avg: 91.6  Min: 81  Max: 101  RR 18 Resp  Avg: 18  Min: 18  Max: 18  SaO2    98/RA No Data Recorded       24 Hour I/O Current Shift I/O  Time Ins Outs       FHR: 135 baseline, +accels, no decels, mod variability Toco: irregular, rare UCs Gen: NAD SVE: 1/20/high SSE: normal. Foley bulb placed and inflated with 40mL of NS, via sterile technique   Recent Labs Lab 10/11/14 1431 10/14/14 1614  WBC 14.1* 12.6*  HGB 10.5* 10.7*  HCT 32.8* 34.2*  PLT 175 186    Recent Labs Lab 10/11/14 1431 10/14/14 1614  NA 139 136  K 3.5 3.6  CL 109 108  CO2 20* 22  BUN <5* <5*  CREATININE 0.56 0.55  CALCIUM 8.9 9.1  PROT 6.5 6.3*  BILITOT 0.4 0.3  ALKPHOS 110 122  ALT 15 14  AST 27 27  GLUCOSE 92 67    Medications Current Facility-Administered Medications  Medication Dose Route Frequency Provider Last Rate Last Dose  . acetaminophen (TYLENOL) tablet 650 mg  650 mg Oral Q6H PRN Marta Antu, CNM   650 mg at 10/14/14 1158  . calcium carbonate (TUMS - dosed in mg elemental calcium) chewable tablet 400 mg of elemental calcium  2 tablet Oral QID PRN Marta Antu, CNM   400 mg of elemental calcium at 10/14/14 2058  . famotidine  (PEPCID) tablet 20 mg  20 mg Oral Daily Marta Antu, CNM      . lactated ringers infusion 500-1,000 mL  500-1,000 mL Intravenous PRN Marta Antu, CNM      . lactated ringers infusion   Intravenous Continuous Marta Antu, CNM 125 mL/hr at 10/15/14 1430    . misoprostol (CYTOTEC) tablet 25 mcg  25 mcg Oral 6 times per day Marta Antu, CNM   25 mcg at 10/15/14 1102  . oxytocin (PITOCIN) IV BOLUS FROM BAG  500 mL Intravenous Continuous Marta Antu, CNM      . oxytocin (PITOCIN) IV infusion 40 units in LR 1000 mL  62.5 mL/hr Intravenous Continuous Marta Antu, CNM      . sodium chloride 0.9 % injection           . terbutaline (BRETHINE) injection 0.25 mg  0.25 mg Subcutaneous Once PRN Marta Antu, CNM      . zolpidem (AMBIEN) tablet 5 mg  5 mg Oral QHS PRN Marta Antu, CNM   5 mg at 10/14/14 2222    Assessment & Plan:  Pt stable *IUP: category I tracing with accels *IOL: will tape FB to leg and tug on it every few hours; remove it in 12-24hrs and PRN. -s/p 5 doses of PO miso -leopolds: 3300gm, cephalic, pelvis feels adequate *cHTN: s/p negative lab work up on admission *h/o PP depression: recommend 1wk delivery follow up *s/p normal 3hr glucose testing: no e/o macrosomia or elevated AC on u/s. Pelvis tested to 3400gm *Scant PNC: UDS neg *Asthma: no needs; was on qvar and prn albuterol at some point during pregnancy *GBS: neg *Analgesia: no needs  [place] Bing, Montez Hageman MD [facility] Pager [phone]

## 2014-10-16 ENCOUNTER — Inpatient Hospital Stay: Payer: Medicaid Other | Admitting: Anesthesiology

## 2014-10-16 LAB — CBC
HCT: 33.7 % — ABNORMAL LOW (ref 35.0–47.0)
HEMOGLOBIN: 10.6 g/dL — AB (ref 12.0–16.0)
MCH: 24.5 pg — AB (ref 26.0–34.0)
MCHC: 31.4 g/dL — ABNORMAL LOW (ref 32.0–36.0)
MCV: 78.2 fL — ABNORMAL LOW (ref 80.0–100.0)
PLATELETS: 159 10*3/uL (ref 150–440)
RBC: 4.32 MIL/uL (ref 3.80–5.20)
RDW: 14.7 % — ABNORMAL HIGH (ref 11.5–14.5)
WBC: 13.3 10*3/uL — AB (ref 3.6–11.0)

## 2014-10-16 MED ORDER — OXYTOCIN 40 UNITS IN LACTATED RINGERS INFUSION - SIMPLE MED
1.0000 m[IU]/min | INTRAVENOUS | Status: DC
  Administered 2014-10-16: 1 m[IU]/min via INTRAVENOUS
  Filled 2014-10-16: qty 1000

## 2014-10-16 MED ORDER — LIDOCAINE HCL (PF) 1 % IJ SOLN
INTRAMUSCULAR | Status: DC | PRN
  Administered 2014-10-16: 2 mL

## 2014-10-16 MED ORDER — STERILE WATER FOR INJECTION IJ SOLN
INTRAMUSCULAR | Status: AC
Start: 2014-10-16 — End: 2014-10-17
  Filled 2014-10-16: qty 50

## 2014-10-16 MED ORDER — AMMONIA AROMATIC IN INHA
RESPIRATORY_TRACT | Status: AC
  Filled 2014-10-16: qty 10

## 2014-10-16 MED ORDER — TERBUTALINE SULFATE 1 MG/ML IJ SOLN: 0.2500 mg | Freq: Once | INTRAMUSCULAR | Status: DC | PRN

## 2014-10-16 MED ORDER — OXYTOCIN 10 UNIT/ML IJ SOLN
INTRAMUSCULAR | Status: AC
  Filled 2014-10-16: qty 2

## 2014-10-16 MED ORDER — BUPIVACAINE HCL (PF) 0.25 % IJ SOLN
INTRAMUSCULAR | Status: DC | PRN
  Administered 2014-10-16: 5 mL

## 2014-10-16 MED ORDER — MISOPROSTOL 200 MCG PO TABS
ORAL_TABLET | ORAL | Status: AC
  Filled 2014-10-16: qty 4

## 2014-10-16 MED ORDER — LIDOCAINE HCL (PF) 1 % IJ SOLN
INTRAMUSCULAR | Status: AC
  Filled 2014-10-16: qty 30

## 2014-10-16 MED ORDER — FENTANYL 2.5 MCG/ML W/ROPIVACAINE 0.2% IN NS 100 ML EPIDURAL INFUSION (ARMC-ANES)
EPIDURAL | Status: AC
  Administered 2014-10-16: 9 mL/h via EPIDURAL
  Filled 2014-10-16: qty 100

## 2014-10-16 NOTE — Anesthesia Procedure Notes (Signed)
Epidural Patient location during procedure: OB Start time: 10/16/2014 10:05 PM End time: 10/16/2014 10:23 PM  Staffing Anesthesiologist: Elijio Miles F Performed by: anesthesiologist   Preanesthetic Checklist Completed: patient identified, site marked, surgical consent, pre-op evaluation, timeout performed, IV checked, risks and benefits discussed and monitors and equipment checked  Epidural Patient position: sitting Prep: Betadine Patient monitoring: heart rate and blood pressure Approach: midline Location: L3-L4 Injection technique: LOR air and LOR saline  Needle:  Needle type: Tuohy  Needle gauge: 18 G Needle length: 9 cm Needle insertion depth: 5 cm Catheter type: closed end flexible Catheter size: 20 Guage Catheter at skin depth: 9 cm Test dose: negative and 2% lidocaine with Epi 1:200 K  Assessment Sensory level: T8

## 2014-10-16 NOTE — Progress Notes (Signed)
Subjective:  Doing well, some increasing pain with contractions  Objective:   Filed Vitals:   10/16/14 1539 10/16/14 1635 10/16/14 1808 10/16/14 1836  BP: 148/89 145/86 147/107 143/86  Pulse: 80 66 85 86  Temp:  97.7 F (36.5 C) 98 F (36.7 C) 98.3 F (36.8 C)  TempSrc:  Oral Oral Oral  Resp:  General: NAD Abdomen: Gravid, non-tender Cervical Exam:  Dilation: 3 Effacement (%): 50 Cervical Position: Posterior Station: -3 Presentation: Vertex Exam by:: Kierrah Kilbride AROM clear  FHT: 130, moderate, +accels, no decels Toco: q62min  No results found for this or any previous visit (from the past 24 hour(s)).  Assessment:   29 y.o. [id number] [redacted]w[redacted]d IOL for worsening CHTN and borderline low AFI  Plan:   1) Labor - continue active management - con't pitocin - AROM clear  2) Fetus - cat I tracing  3) CHTN - BP remain mild range

## 2014-10-16 NOTE — Progress Notes (Signed)
Subjective:  Doing well.  Not feeling much in the way contractions.  Foley came out this morning but still 1cm  Objective:   Filed Vitals:   10/15/14 2212 10/16/14 0708 10/16/14 0934 10/16/14 1010  BP: 117/82 139/84 150/84 133/88  Pulse: 73 76 80 73  Temp:  97.9 F (36.6 C) 98.3 F (36.8 C)   TempSrc:  Oral Oral   Resp:  18      General:  NAD Abdomen: gravid, non-tender Cervical Exam:  Dilation: 1 Effacement (%): Thick Cervical Position: Posterior Station: -2 Presentation: Vertex Exam by:: gck  FHT: 130, moderate, +accels, no decels Toco:  No results found for this or any previous visit (from the past 24 hour(s)).  Assessment:   29 y.o. Z6X0960 [redacted]w[redacted]d with worsening CHTN and low normal AFI, recommended IOL by MFM  Plan:   1) Labor - pitocin, will re-evaluate for placement of foley  2) Fetus - category I tracing  3) CHTN - BP's remain mild range

## 2014-10-16 NOTE — Anesthesia Preprocedure Evaluation (Signed)
Anesthesia Evaluation  Patient identified by MRN, date of birth, ID band Patient awake    Reviewed: Allergy & Precautions, NPO status , Patient's Chart, lab work & pertinent test results  Airway Mallampati: III       Dental no notable dental hx.    Pulmonary asthma , Current Smoker,    Pulmonary exam normal       Cardiovascular hypertension, Normal cardiovascular exam    Neuro/Psych Depression    GI/Hepatic negative GI ROS, Neg liver ROS,   Endo/Other  negative endocrine ROSMorbid obesity  Renal/GU negative Renal ROS     Musculoskeletal   Abdominal (+) + obese,   Peds  Hematology   Anesthesia Other Findings   Reproductive/Obstetrics                             Anesthesia Physical Anesthesia Plan  ASA: III  Anesthesia Plan: Epidural   Post-op Pain Management:    Induction:   Airway Management Planned: Natural Airway  Additional Equipment:   Intra-op Plan:   Post-operative Plan:   Informed Consent: I have reviewed the patients History and Physical, chart, labs and discussed the procedure including the risks, benefits and alternatives for the proposed anesthesia with the patient or authorized representative who has indicated his/her understanding and acceptance.     Plan Discussed with:   Anesthesia Plan Comments:         Anesthesia Quick Evaluation

## 2014-10-16 NOTE — Progress Notes (Signed)
Subjective:  Increasing intensity to contractions  Objective:  Vitals: Blood pressure 144/96, pulse 84, temperature 98.3 F (36.8 C), temperature source Oral, resp. rate 18, last menstrual period 01/14/2014. General: NAD Abdomen: gravid, soft, non-tender Cervical Exam:  Dilation: 1.5 Effacement (%): 50 Cervical Position: Posterior Station: -3 Presentation: Vertex Exam by:: Kazi Montoro Foley bulb placed  FHT: 140, moderate, +accels, no decels Toco: q54min  No results found for this or any previous visit (from the past 24 hour(s)).  Assessment:   29 y.o. Z6X0960 [redacted]w[redacted]d IOL worsening CHTN and AFI of 6c,  Plan:   1) Labor  - continue pitocin - foley placed  2) Fetus - cat I tracing

## 2014-10-16 NOTE — Discharge Summary (Signed)
Obstetric Discharge Summary Reason for Admission: induction of labor Prenatal Procedures: NST Intrapartum Procedures: spontaneous vaginal delivery Postpartum Procedures: none Complications-Operative and Postpartum: none HEMOGLOBIN  Date Value Ref Range Status  10/17/2014 9.8* 12.0 - 16.0 g/dL Final   HGB  Date Value Ref Range Status  09/11/2012 10.7* 12.0-16.0 g/dL Final   HCT  Date Value Ref Range Status  10/17/2014 30.3* 35.0 - 47.0 % Final  09/13/2012 26.8* 35.0-47.0 % Final    Physical Exam:  BP 136/88 mmHg  Pulse 92  Temp(Src) 98 F (36.7 C) (Oral)  Resp 18  Ht  (1.575 m)  Wt 204 lb (92.534 kg)  BMI 37.30 kg/m2  SpO2 99%  LMP 01/14/2014  Breastfeeding? Unknown  General: alert and no distress Lochia: appropriate Uterine Fundus: firm DVT Evaluation: No evidence of DVT seen on physical exam.  Discharge Diagnoses: Term Pregnancy-delivered  Discharge Information: Date: 10/18/2014 Diet: routine Medications:    Medication List    STOP taking these medications        aspirin 81 MG tablet      TAKE these medications        albuterol 108 (90 BASE) MCG/ACT inhaler  Commonly known as:  PROVENTIL HFA;VENTOLIN HFA  Inhale 1-2 puffs into the lungs every 6 (six) hours as needed for wheezing or shortness of breath.     budesonide 180 MCG/ACT inhaler  Commonly known as:  PULMICORT  Inhale 1 puff into the lungs 2 (two) times daily.     ibuprofen 600 MG tablet  Commonly known as:  ADVIL,MOTRIN  Take 1 tablet (600 mg total) by mouth every 6 (six) hours.     loratadine 10 MG tablet  Commonly known as:  CLARITIN  Take 10 mg by mouth daily.     prenatal multivitamin Tabs tablet  Take 1 tablet by mouth daily at 12 noon.     ranitidine 150 MG tablet  Commonly known as:  ZANTAC  Take 150 mg by mouth 2 (two) times daily.        Condition: stable Discharge to: home Follow-up Information    Follow up with [facility] In 2 weeks.   Why:  bp check   Contact information:   [address] [place] [address] [phone]       Newborn Data: Live born female  APGAR: 8, [birth year]   Home with mother.  Conard Novak, MD 10/18/2014, 9:56 AM

## 2014-10-17 ENCOUNTER — Encounter: Payer: Self-pay | Admitting: *Deleted

## 2014-10-17 LAB — CBC
HEMATOCRIT: 30.3 % — AB (ref 35.0–47.0)
Hemoglobin: 9.8 g/dL — ABNORMAL LOW (ref 12.0–16.0)
MCH: 25.3 pg — ABNORMAL LOW (ref 26.0–34.0)
MCHC: 32.2 g/dL (ref 32.0–36.0)
MCV: 78.7 fL — ABNORMAL LOW (ref 80.0–100.0)
PLATELETS: 141 10*3/uL — AB (ref 150–440)
RBC: 3.85 MIL/uL (ref 3.80–5.20)
RDW: 14.8 % — AB (ref 11.5–14.5)
WBC: 16.3 10*3/uL — ABNORMAL HIGH (ref 3.6–11.0)

## 2014-10-17 MED ORDER — DIPHENHYDRAMINE HCL 50 MG/ML IJ SOLN: 12.5000 mg | INTRAMUSCULAR | Status: DC | PRN

## 2014-10-17 MED ORDER — OXYCODONE-ACETAMINOPHEN 5-325 MG PO TABS: 2.0000 | ORAL_TABLET | ORAL | Status: DC | PRN

## 2014-10-17 MED ORDER — OXYTOCIN 40 UNITS IN LACTATED RINGERS INFUSION - SIMPLE MED
62.5000 mL/h | INTRAVENOUS | Status: DC
  Administered 2014-10-17: 62.5 mL/h via INTRAVENOUS
  Filled 2014-10-17: qty 1000

## 2014-10-17 MED ORDER — ACETAMINOPHEN 325 MG PO TABS: 650.0000 mg | ORAL_TABLET | ORAL | Status: DC | PRN

## 2014-10-17 MED ORDER — PHENYLEPHRINE 40 MCG/ML (10ML) SYRINGE FOR IV PUSH (FOR BLOOD PRESSURE SUPPORT): 80.0000 ug | PREFILLED_SYRINGE | INTRAVENOUS | Status: DC | PRN

## 2014-10-17 MED ORDER — ONDANSETRON HCL 4 MG PO TABS: 4.0000 mg | ORAL_TABLET | ORAL | Status: DC | PRN

## 2014-10-17 MED ORDER — SIMETHICONE 80 MG PO CHEW: 80.0000 mg | CHEWABLE_TABLET | ORAL | Status: DC | PRN

## 2014-10-17 MED ORDER — EPHEDRINE 5 MG/ML INJ: 10.0000 mg | INTRAVENOUS | Status: DC | PRN

## 2014-10-17 MED ORDER — PRENATAL MULTIVITAMIN CH
1.0000 | ORAL_TABLET | Freq: Every day | ORAL | Status: DC
  Administered 2014-10-17: 1 via ORAL
  Filled 2014-10-17: qty 1

## 2014-10-17 MED ORDER — ALBUTEROL SULFATE (2.5 MG/3ML) 0.083% IN NEBU: 3.0000 mL | INHALATION_SOLUTION | Freq: Four times a day (QID) | RESPIRATORY_TRACT | Status: DC | PRN

## 2014-10-17 MED ORDER — LANOLIN HYDROUS EX OINT: TOPICAL_OINTMENT | CUTANEOUS | Status: DC | PRN

## 2014-10-17 MED ORDER — BENZOCAINE-MENTHOL 20-0.5 % EX AERO: 1.0000 "application " | INHALATION_SPRAY | CUTANEOUS | Status: DC | PRN

## 2014-10-17 MED ORDER — BUDESONIDE 0.5 MG/2ML IN SUSP
0.5000 mg | Freq: Two times a day (BID) | RESPIRATORY_TRACT | Status: DC
  Filled 2014-10-17 (×2): qty 2

## 2014-10-17 MED ORDER — DIBUCAINE 1 % RE OINT: 1.0000 "application " | TOPICAL_OINTMENT | RECTAL | Status: DC | PRN

## 2014-10-17 MED ORDER — MEDROXYPROGESTERONE ACETATE 150 MG/ML IM SUSP
150.0000 mg | Freq: Once | INTRAMUSCULAR | Status: AC
  Administered 2014-10-18: 150 mg via INTRAMUSCULAR
  Filled 2014-10-17: qty 1

## 2014-10-17 MED ORDER — OXYCODONE-ACETAMINOPHEN 5-325 MG PO TABS
1.0000 | ORAL_TABLET | ORAL | Status: DC | PRN
  Administered 2014-10-17 (×2): 1 via ORAL
  Filled 2014-10-17 (×2): qty 1

## 2014-10-17 MED ORDER — SENNOSIDES-DOCUSATE SODIUM 8.6-50 MG PO TABS
2.0000 | ORAL_TABLET | ORAL | Status: DC
  Administered 2014-10-17: 2 via ORAL
  Filled 2014-10-17: qty 2

## 2014-10-17 MED ORDER — DIPHENHYDRAMINE HCL 25 MG PO CAPS: 25.0000 mg | ORAL_CAPSULE | Freq: Four times a day (QID) | ORAL | Status: DC | PRN

## 2014-10-17 MED ORDER — WITCH HAZEL-GLYCERIN EX PADS: 1.0000 "application " | MEDICATED_PAD | CUTANEOUS | Status: DC | PRN

## 2014-10-17 MED ORDER — IBUPROFEN 600 MG PO TABS
600.0000 mg | ORAL_TABLET | Freq: Four times a day (QID) | ORAL | Status: DC
  Administered 2014-10-17 – 2014-10-18 (×4): 600 mg via ORAL
  Filled 2014-10-17 (×4): qty 1

## 2014-10-17 MED ORDER — FENTANYL 2.5 MCG/ML W/ROPIVACAINE 0.2% IN NS 100 ML EPIDURAL INFUSION (ARMC-ANES): 9.0000 mL/h | EPIDURAL | Status: DC

## 2014-10-17 MED ORDER — ONDANSETRON HCL 4 MG/2ML IJ SOLN: 4.0000 mg | INTRAMUSCULAR | Status: DC | PRN

## 2014-10-17 NOTE — Anesthesia Postprocedure Evaluation (Signed)
  Anesthesia Post-op Note  Patient: Tina Parrish  Procedure(s) Performed:  Anesthesia type:Epidural  Patient location: Floor  Post pain: Pain level controlled  Post assessment: Post-op Vital signs reviewed, Patient's Cardiovascular Status Stable, Respiratory Function Stable, Patent Airway and No signs of Nausea or vomiting  Post vital signs: Reviewed and stable  Last Vitals:  Filed Vitals:   10/17/14 0205  BP: 119/81  Pulse: 97  Temp: 36.9 C  Resp: 20    Level of consciousness: awake, alert  and patient cooperative  Complications: No apparent anesthesia complications

## 2014-10-17 NOTE — Anesthesia Post-op Follow-up Note (Cosign Needed)
  Anesthesia Pain Follow-up Note  Patient: Tina Parrish  Day #: 1  Date of Follow-up: 10/17/2014 Time: 7:23 AM  Last Vitals:  Filed Vitals:   10/17/14 0205  BP: 119/81  Pulse: 97  Temp: 36.9 C  Resp: 20    Level of Consciousness: alert  Pain: mild   Side Effects:None  Catheter Site Exam: not evaluated   Plan: D/C from anesthesia care  Clydene Pugh

## 2014-10-17 NOTE — Progress Notes (Signed)
Admit Date: 10/14/2014 Today's Date: 10/17/2014  Post Partum Day 1/2  Subjective:  no complaints, up ad lib and tolerating PO  Objective: Temp:  [97.7 F (36.5 C)-98.6 F (37 C)] 97.7 F (36.5 C) (09/01 0738) Pulse Rate:  [65-97] 82 (09/01 0738) Resp:  [18-20] 20 (09/01 0738) BP: (119-158)/(62-107) 135/88 mmHg (09/01 0738) SpO2:  [98 %-100 %] 99 % (09/01 0738) Weight:  [92.534 kg (204 lb)] 92.534 kg (204 lb) (09/01 0205)  Physical Exam:  General: alert and cooperative Lochia: appropriate Uterine Fundus: firm Incision: none DVT Evaluation: No evidence of DVT seen on physical exam.   Recent Labs  10/16/14 2127 10/17/14 0553  HGB 10.6* 9.8*  HCT 33.7* 30.3*    Assessment/Plan: Plan for discharge tomorrow, Contraception --- plans Depo and Infant doing well   LOS: 3 days   Gordon Vandunk [facility] 10/17/2014, 9:05 AM

## 2014-10-18 MED ORDER — IBUPROFEN 600 MG PO TABS: 600.0000 mg | ORAL_TABLET | Freq: Four times a day (QID) | ORAL | Status: DC

## 2015-08-31 ENCOUNTER — Emergency Department
Admission: EM | Admit: 2015-08-31 | Discharge: 2015-08-31 | Disposition: A | Discharge status: Home / Self Care | Payer: Medicaid Other | Attending: Emergency Medicine | Admitting: Emergency Medicine

## 2015-08-31 ENCOUNTER — Encounter: Payer: Self-pay | Admitting: Emergency Medicine

## 2015-08-31 ENCOUNTER — Emergency Department: Payer: Medicaid Other

## 2015-08-31 DIAGNOSIS — Y999 Unspecified external cause status: Secondary | ICD-10-CM | POA: Diagnosis not present

## 2015-08-31 DIAGNOSIS — Y9389 Activity, other specified: Secondary | ICD-10-CM | POA: Insufficient documentation

## 2015-08-31 DIAGNOSIS — F17200 Nicotine dependence, unspecified, uncomplicated: Secondary | ICD-10-CM | POA: Insufficient documentation

## 2015-08-31 DIAGNOSIS — S60041A Contusion of right ring finger without damage to nail, initial encounter: Secondary | ICD-10-CM | POA: Diagnosis not present

## 2015-08-31 DIAGNOSIS — Z79899 Other long term (current) drug therapy: Secondary | ICD-10-CM | POA: Insufficient documentation

## 2015-08-31 DIAGNOSIS — Y929 Unspecified place or not applicable: Secondary | ICD-10-CM | POA: Insufficient documentation

## 2015-08-31 DIAGNOSIS — W208XXA Other cause of strike by thrown, projected or falling object, initial encounter: Secondary | ICD-10-CM | POA: Diagnosis not present

## 2015-08-31 DIAGNOSIS — S60051A Contusion of right little finger without damage to nail, initial encounter: Secondary | ICD-10-CM | POA: Insufficient documentation

## 2015-08-31 DIAGNOSIS — J45909 Unspecified asthma, uncomplicated: Secondary | ICD-10-CM | POA: Insufficient documentation

## 2015-08-31 DIAGNOSIS — S60221A Contusion of right hand, initial encounter: Secondary | ICD-10-CM | POA: Diagnosis not present

## 2015-08-31 DIAGNOSIS — F329 Major depressive disorder, single episode, unspecified: Secondary | ICD-10-CM | POA: Diagnosis not present

## 2015-08-31 DIAGNOSIS — M79641 Pain in right hand: Secondary | ICD-10-CM | POA: Diagnosis present

## 2015-08-31 DIAGNOSIS — S6000XA Contusion of unspecified finger without damage to nail, initial encounter: Secondary | ICD-10-CM

## 2015-08-31 MED ORDER — IBUPROFEN 600 MG PO TABS
600.0000 mg | ORAL_TABLET | Freq: Once | ORAL | Status: AC
  Administered 2015-08-31: 600 mg via ORAL
  Filled 2015-08-31: qty 1

## 2015-08-31 MED ORDER — IBUPROFEN 600 MG PO TABS: 600.0000 mg | ORAL_TABLET | Freq: Three times a day (TID) | ORAL | Status: DC | PRN

## 2015-08-31 NOTE — Discharge Instructions (Signed)
Contusion A contusion is a deep bruise. Contusions happen when an injury causes bleeding under the skin. Symptoms of bruising include pain, swelling, and discolored skin. The skin may turn blue, purple, or yellow. HOME CARE   Rest the injured area.  If told, put ice on the injured area.  Put ice in a plastic bag.  Place a towel between your skin and the bag.  Leave the ice on for 20 minutes, 2-3 times per day.  If told, put light pressure (compression) on the injured area using an elastic bandage. Make sure the bandage is not too tight. Remove it and put it back on as told by your doctor.  If possible, raise (elevate) the injured area above the level of your heart while you are sitting or lying down.  Take over-the-counter and prescription medicines only as told by your doctor. GET HELP IF:  Your symptoms do not get better after several days of treatment.  Your symptoms get worse.  You have trouble moving the injured area. GET HELP RIGHT AWAY IF:   You have very bad pain.  You have a loss of feeling (numbness) in a hand or foot.  Your hand or foot turns pale or cold.   This information is not intended to replace advice given to you by your health care provider. Make sure you discuss any questions you have with your health care provider.   Document Released: 07/21/2007 Document Revised: 10/23/2014 Document Reviewed: 06/19/2014 Elsevier Interactive Patient Education 2016 [employer]  Cryotherapy Cryotherapy is when you put ice on your injury. Ice helps lessen pain and puffiness (swelling) after an injury. Ice works the best when you start using it in the first 24 to 48 hours after an injury. HOME CARE  Put a dry or damp towel between the ice pack and your skin.  You may press gently on the ice pack.  Leave the ice on for no more than 10 to 20 minutes at a time.  Check your skin after 5 minutes to make sure your skin is okay.  Rest at least 20 minutes between ice  pack uses.  Stop using ice when your skin loses feeling (numbness).  Do not use ice on someone who cannot tell you when it hurts. This includes small children and people with memory problems (dementia). GET HELP RIGHT AWAY IF:  You have white spots on your skin.  Your skin turns blue or pale.  Your skin feels waxy or hard.  Your puffiness gets worse. MAKE SURE YOU:   Understand these instructions.  Will watch your condition.  Will get help right away if you are not doing well or get worse.   This information is not intended to replace advice given to you by your health care provider. Make sure you discuss any questions you have with your health care provider.   Document Released: 07/21/2007 Document Revised: 04/26/2011 Document Reviewed: 09/24/2010 Elsevier Interactive Patient Education 2016 [employer]   Begin taking ibuprofen 600 mg 3 times a day with food. Continue ice to your hand as needed for swelling and pain. [facility] if any continued problems.

## 2015-08-31 NOTE — ED Provider Notes (Signed)
[facility] Emergency Department Provider Note  ____________________________________________  Time seen: Approximately 9:09 AM  I have reviewed the triage vital signs and the nursing notes.   HISTORY  Chief Complaint Hand Injury   HPI Tina Parrish is a 30 y.o. female is here complaining of right hand pain. Patient states she dropped a couch on her right hand while moving furniture this morning. Patient complains of pain also in the fourth and fifth fingers. She is not taking any over-the-counter medication prior to arrival in the emergency room. She denies any previous injury to her hand. Currently she rates her pain is 7 out of 10.     Past Medical History  Diagnosis Date  . Asthma   . Depression     Postpartum  . Hypertension affecting pregnancy     chronic    Patient Active Problem List   Diagnosis Date Noted  . Chronic hypertension with exacerbation during pregnancy 10/14/2014  . Indication for care in labor and delivery, antepartum 10/07/2014  . Labor and delivery, indication for care 09/23/2014    Past Surgical History  Procedure Laterality Date  . No past surgeries      Current Outpatient Rx  Name  Route  Sig  Dispense  Refill  . albuterol (PROVENTIL HFA;VENTOLIN HFA) 108 (90 BASE) MCG/ACT inhaler   Inhalation   Inhale 1-2 puffs into the lungs every 6 (six) hours as needed for wheezing or shortness of breath.         . budesonide (PULMICORT) 180 MCG/ACT inhaler   Inhalation   Inhale 1 puff into the lungs 2 (two) times daily.         Marland Kitchen. ibuprofen (ADVIL,MOTRIN) 600 MG tablet   Oral   Take 1 tablet (600 mg total) by mouth every 8 (eight) hours as needed.   30 tablet   0   . loratadine (CLARITIN) 10 MG tablet   Oral   Take 10 mg by mouth daily.         . Prenatal Vit-Fe Fumarate-FA (PRENATAL MULTIVITAMIN) TABS tablet   Oral   Take 1 tablet by mouth daily at 12 noon.         . ranitidine (ZANTAC) 150 MG tablet   Oral   Take 150 mg by mouth 2 (two) times daily.           Allergies Review of patient's allergies indicates no known allergies.  No family history on file.  Social History Social History  Substance Use Topics  . Smoking status: Light Tobacco Smoker -- 0.25 packs/day  . Smokeless tobacco: Never Used  . Alcohol Use: Yes     Comment: occas.    Review of Systems Constitutional: No fever/chills Eyes: No visual changes. Cardiovascular: Denies chest pain. Respiratory: Denies shortness of breath. Gastrointestinal: .  No nausea, no vomiting.  Musculoskeletal:Positive for right hand pain. Skin: Positive for soft tissue swelling. Neurological: Negative for headaches, focal weakness or numbness.  10-point ROS otherwise negative.  ____________________________________________   PHYSICAL EXAM:  VITAL SIGNS: ED Triage Vitals  Enc Vitals Group     BP 08/31/15 0852 134/93 mmHg     Pulse Rate 08/31/15 0852 112     Resp 08/31/15 0852 20     Temp 08/31/15 0852 98.4 F (36.9 C)     Temp Source 08/31/15 0852 Oral     SpO2 08/31/15 0852 99 %     Weight 08/31/15 0852 179 lb (81.194 kg)  Height 08/31/15 0852  (1.549 m)     Head Cir --      Peak Flow --      Pain Score 08/31/15 0854 7     Pain Loc --      Pain Edu? --      Excl. in GC? --     Constitutional: Alert and oriented. Well appearing and in no acute distress. Eyes: Conjunctivae are normal. PERRL. EOMI. Head: Atraumatic. Nose: No congestion/rhinnorhea. Neck: No stridor.   Cardiovascular: Normal rate, regular rhythm. Grossly normal heart sounds.  Good peripheral circulation. Respiratory: Normal respiratory effort.  No retractions. Lungs CTAB. Musculoskeletal: Examination of the right hand there is no gross deformity. There is tenderness on palpation of the dorsal aspect of the fourth and fifth metacarpal area. Minimal soft tissue swelling is present. Patient is able to make a fist and fully extend fingers.  Pulses present motor sensory function intact. Neurologic:  Normal speech and language. No gross focal neurologic deficits are appreciated. No gait instability. Skin:  Skin is warm, dry and intact. No rash noted. Psychiatric: Mood and affect are normal. Speech and behavior are normal.  ____________________________________________   LABS (all labs ordered are listed, but only abnormal results are displayed)  Labs Reviewed - No data to display  RADIOLOGY  Right hand x-ray per radiologist shows no evidence of fracture dislocation. I, Tommi Rumps, personally viewed and evaluated these images (plain radiographs) as part of my medical decision making, as well as reviewing the written report by the radiologist. ____________________________________________   PROCEDURES  Procedure(s) performed: None  Procedures  Critical Care performed: No  ____________________________________________   INITIAL IMPRESSION / ASSESSMENT AND PLAN / ED COURSE  Pertinent labs & imaging results that were available during my care of the patient were reviewed by me and considered in my medical decision making (see chart for details).  Patient was given ibuprofen while in the emergency room and told to continue 3 times a day with food. She is also encouraged to use ice to her hand as needed for swelling and follow-up with [facility] clinic if any continued problems. ____________________________________________   FINAL CLINICAL IMPRESSION(S) / ED DIAGNOSES  Final diagnoses:  Contusion of right hand including fingers, initial encounter      NEW MEDICATIONS STARTED DURING THIS VISIT:  Discharge Medication List as of 08/31/2015 10:04 AM       Note:  This document was prepared using Dragon voice recognition software and may include unintentional dictation errors.    Tommi Rumps, PA-C 08/31/15 1022  Nita Sickle, MD 08/31/15 2059

## 2015-08-31 NOTE — ED Notes (Signed)
See triage note  States she dropped a couch on to right hand .Tina Parrish. Min swelling noted to lateral right hand and 4th and 5th fingers

## 2015-08-31 NOTE — ED Notes (Signed)
States she was moving furniture and couch fell on right hand. Pain and swelling to right hand.

## 2016-12-26 ENCOUNTER — Emergency Department
Admission: EM | Admit: 2016-12-26 | Discharge: 2016-12-26 | Disposition: A | Discharge status: Home / Self Care | Payer: Medicaid Other | Attending: Emergency Medicine | Admitting: Emergency Medicine

## 2016-12-26 ENCOUNTER — Other Ambulatory Visit: Payer: Self-pay

## 2016-12-26 ENCOUNTER — Encounter: Payer: Self-pay | Admitting: Emergency Medicine

## 2016-12-26 DIAGNOSIS — N938 Other specified abnormal uterine and vaginal bleeding: Secondary | ICD-10-CM | POA: Diagnosis present

## 2016-12-26 DIAGNOSIS — R1084 Generalized abdominal pain: Secondary | ICD-10-CM | POA: Insufficient documentation

## 2016-12-26 DIAGNOSIS — F17200 Nicotine dependence, unspecified, uncomplicated: Secondary | ICD-10-CM | POA: Insufficient documentation

## 2016-12-26 DIAGNOSIS — Z79899 Other long term (current) drug therapy: Secondary | ICD-10-CM | POA: Insufficient documentation

## 2016-12-26 DIAGNOSIS — N946 Dysmenorrhea, unspecified: Secondary | ICD-10-CM | POA: Insufficient documentation

## 2016-12-26 DIAGNOSIS — N9489 Other specified conditions associated with female genital organs and menstrual cycle: Secondary | ICD-10-CM | POA: Diagnosis not present

## 2016-12-26 DIAGNOSIS — J45909 Unspecified asthma, uncomplicated: Secondary | ICD-10-CM | POA: Insufficient documentation

## 2016-12-26 LAB — HCG, QUANTITATIVE, PREGNANCY

## 2016-12-26 LAB — ABO/RH: ABO/RH(D): O POS

## 2016-12-26 NOTE — ED Provider Notes (Signed)
[facility] Emergency Department Provider Note  ____________________________________________  Time seen: Approximately 2:19 PM  I have reviewed the triage vital signs and the nursing notes.   HISTORY  Chief Complaint Vaginal Bleeding; Back Pain; and Threatened Miscarriage   HPI Tina Parrish is a 31 y.o. female who presents for evaluation of vaginal bleeding. Patient reports that her periods are very irregular. Her last one was 2 months ago. On Friday she took a home pregnancy test which was positive. Yesterday she started to have vaginal bleeding which she says is similar to her periods. She is been having mild intermittent lower abdominal cramping radiating to her back which is similar to her menstrual cramps. No vaginal discharge, no dysuria or hematuria, no chest pain or shortness of breath, no fever or chills. Currently she has no abdominal pain.  Past Medical History:  Diagnosis Date  . Asthma   . Depression    Postpartum  . Hypertension affecting pregnancy    chronic    Patient Active Problem List   Diagnosis Date Noted  . Chronic hypertension with exacerbation during pregnancy 10/14/2014  . Indication for care in labor and delivery, antepartum 10/07/2014  . Labor and delivery, indication for care 09/23/2014    Past Surgical History:  Procedure Laterality Date  . NO PAST SURGERIES      Prior to Admission medications   Medication Sig Start Date End Date Taking? Authorizing Provider  albuterol (PROVENTIL HFA;VENTOLIN HFA) 108 (90 BASE) MCG/ACT inhaler Inhale 1-2 puffs into the lungs every 6 (six) hours as needed for wheezing or shortness of breath.    [provider]  budesonide (PULMICORT) 180 MCG/ACT inhaler Inhale 1 puff into the lungs 2 (two) times daily.    [provider]  ibuprofen (ADVIL,MOTRIN) 600 MG tablet Take 1 tablet (600 mg total) by mouth every 8 (eight) hours as needed. 08/31/15   Tommi RumpsSummers, Rhonda L, PA-C    loratadine (CLARITIN) 10 MG tablet Take 10 mg by mouth daily.    [provider]  Prenatal Vit-Fe Fumarate-FA (PRENATAL MULTIVITAMIN) TABS tablet Take 1 tablet by mouth daily at 12 noon.    [provider]  ranitidine (ZANTAC) 150 MG tablet Take 150 mg by mouth 2 (two) times daily.    [provider]    Allergies Patient has no known allergies.  History reviewed. No pertinent family history.  Social History Social History   Tobacco Use  . Smoking status: Light Tobacco Smoker    Packs/day: 0.25  . Smokeless tobacco: Never Used  Substance Use Topics  . Alcohol use: Yes    Comment: occas.  . Drug use: No    Review of Systems  Constitutional: Negative for fever. Eyes: Negative for visual changes. ENT: Negative for sore throat. Neck: No neck pain  Cardiovascular: Negative for chest pain. Respiratory: Negative for shortness of breath. Gastrointestinal: + lower abdominal cramping. No vomiting or diarrhea. Genitourinary: Negative for dysuria. + vaginal bleeding Musculoskeletal: Negative for back pain. Skin: Negative for rash. Neurological: Negative for headaches, weakness or numbness. Psych: No SI or HI  ____________________________________________   PHYSICAL EXAM:  VITAL SIGNS: ED Triage Vitals [12/26/16 1237]  Enc Vitals Group     BP 130/88     Pulse Rate (!) 102     Resp 19     Temp 98.3 F (36.8 C)     Temp Source Oral     SpO2 100 %     Weight 165 lb (74.8  kg)     Height 5\' 1"  (1.549 m)     Head Circumference      Peak Flow      Pain Score      Pain Loc      Pain Edu?      Excl. in GC?     Constitutional: Alert and oriented. Well appearing and in no apparent distress. HEENT:      Head: Normocephalic and atraumatic.         Eyes: Conjunctivae are normal. Sclera is non-icteric.       Mouth/Throat: Mucous membranes are moist.       Neck: Supple with no signs of meningismus. Cardiovascular: Regular rate and rhythm. No murmurs,  gallops, or rubs. 2+ symmetrical distal pulses are present in all extremities. No JVD. Respiratory: Normal respiratory effort. Lungs are clear to auscultation bilaterally. No wheezes, crackles, or rhonchi.  Gastrointestinal: Soft, non tender, and non distended with positive bowel sounds. No rebound or guarding. Genitourinary: No CVA tenderness. Musculoskeletal: Nontender with normal range of motion in all extremities. No edema, cyanosis, or erythema of extremities. Neurologic: Normal speech and language. Face is symmetric. Moving all extremities. No gross focal neurologic deficits are appreciated. Skin: Skin is warm, dry and intact. No rash noted. Psychiatric: Mood and affect are normal. Speech and behavior are normal.  ____________________________________________   LABS (all labs ordered are listed, but only abnormal results are displayed)  Labs Reviewed  HCG, QUANTITATIVE, PREGNANCY  POC URINE PREG, ED  ABO/RH   ____________________________________________  EKG  none  ____________________________________________  RADIOLOGY  none  ____________________________________________   PROCEDURES  Procedure(s) performed: None Procedures Critical Care performed:  None ____________________________________________   INITIAL IMPRESSION / ASSESSMENT AND PLAN / ED COURSE  31 y.o. female who presents for evaluation of vaginal bleeding after a positive home pregnancy test taken 2 days ago. Patient is well-appearing, in no distress, her abdomen is soft with no tenderness throughout. Upreg and Beta Quant here are both negative for pregnancy. Patient's last menstrual period was 2 months ago which is normal for her since she has a history of irregular menstrual periods. Explained to her this is most likely her period. No indication for labs or imaging at this time. Will dc home with f/u with PCP and supportive care.      As part of my medical decision making, I reviewed the following data  within the electronic MEDICAL RECORD NUMBER Nursing notes reviewed and incorporated, Labs reviewed , Notes from prior ED visits and [place] Controlled Substance Database    Pertinent labs & imaging results that were available during my care of the patient were reviewed by me and considered in my medical decision making (see chart for details).    ____________________________________________   FINAL CLINICAL IMPRESSION(S) / ED DIAGNOSES  Final diagnoses:  Menstrual cramps      NEW MEDICATIONS STARTED DURING THIS VISIT:  This SmartLink is deprecated. Use AVSMEDLIST instead to display the medication list for a patient.   Note:  This document was prepared using Dragon voice recognition software and may include unintentional dictation errors.    Nita SickleVeronese, [place], MD 12/26/16 [id number]

## 2016-12-26 NOTE — ED Notes (Addendum)
Pt reports vaginal bleeding and back pain. Pt also states she is pregnant but is unsure how far along she is.   EDP at bedside.

## 2016-12-26 NOTE — ED Triage Notes (Signed)
Pt presents to ED c/o vaginal bleeding and back pain . Pt found on Friday that she was pregnant then last night noticed vaginal bleeding with small blood clots. She noticed more blood when pt wipes and urinates. Also with s/s back pain

## 2018-06-14 DIAGNOSIS — A5139 Other secondary syphilis of skin: Secondary | ICD-10-CM | POA: Insufficient documentation

## 2018-06-14 DIAGNOSIS — F1410 Cocaine abuse, uncomplicated: Secondary | ICD-10-CM | POA: Insufficient documentation

## 2018-06-14 DIAGNOSIS — A5149 Other secondary syphilitic conditions: Secondary | ICD-10-CM | POA: Insufficient documentation

## 2018-06-14 DIAGNOSIS — F1411 Cocaine abuse, in remission: Secondary | ICD-10-CM | POA: Insufficient documentation

## 2018-06-14 LAB — HM HIV SCREENING LAB: HM HIV Screening: NEGATIVE

## 2018-06-14 LAB — HM HEPATITIS C SCREENING LAB: HM Hepatitis Screen: NEGATIVE

## 2018-08-15 ENCOUNTER — Emergency Department: Payer: Medicaid Other

## 2018-08-15 ENCOUNTER — Other Ambulatory Visit: Payer: Self-pay

## 2018-08-15 ENCOUNTER — Emergency Department
Admission: EM | Admit: 2018-08-15 | Discharge: 2018-08-15 | Disposition: A | Discharge status: Home / Self Care | Payer: Medicaid Other | Attending: Emergency Medicine | Admitting: Emergency Medicine

## 2018-08-15 DIAGNOSIS — R531 Weakness: Secondary | ICD-10-CM | POA: Insufficient documentation

## 2018-08-15 DIAGNOSIS — J45909 Unspecified asthma, uncomplicated: Secondary | ICD-10-CM | POA: Diagnosis not present

## 2018-08-15 DIAGNOSIS — F17210 Nicotine dependence, cigarettes, uncomplicated: Secondary | ICD-10-CM | POA: Diagnosis not present

## 2018-08-15 DIAGNOSIS — R0602 Shortness of breath: Secondary | ICD-10-CM | POA: Diagnosis not present

## 2018-08-15 DIAGNOSIS — Z20828 Contact with and (suspected) exposure to other viral communicable diseases: Secondary | ICD-10-CM | POA: Diagnosis not present

## 2018-08-15 DIAGNOSIS — F1410 Cocaine abuse, uncomplicated: Secondary | ICD-10-CM | POA: Diagnosis not present

## 2018-08-15 DIAGNOSIS — R200 Anesthesia of skin: Secondary | ICD-10-CM | POA: Insufficient documentation

## 2018-08-15 DIAGNOSIS — I739 Peripheral vascular disease, unspecified: Secondary | ICD-10-CM | POA: Diagnosis not present

## 2018-08-15 DIAGNOSIS — I10 Essential (primary) hypertension: Secondary | ICD-10-CM | POA: Insufficient documentation

## 2018-08-15 DIAGNOSIS — R202 Paresthesia of skin: Secondary | ICD-10-CM

## 2018-08-15 LAB — CBC
HCT: 38.7 % (ref 36.0–46.0)
Hemoglobin: 12.5 g/dL (ref 12.0–15.0)
MCH: 25.7 pg — ABNORMAL LOW (ref 26.0–34.0)
MCHC: 32.3 g/dL (ref 30.0–36.0)
MCV: 79.5 fL — ABNORMAL LOW (ref 80.0–100.0)
Platelets: 242 10*3/uL (ref 150–400)
RBC: 4.87 MIL/uL (ref 3.87–5.11)
RDW: 15.9 % — ABNORMAL HIGH (ref 11.5–15.5)
WBC: 11.6 10*3/uL — ABNORMAL HIGH (ref 4.0–10.5)
nRBC: 0 % (ref 0.0–0.2)

## 2018-08-15 LAB — URINALYSIS, ROUTINE W REFLEX MICROSCOPIC
Bilirubin Urine: NEGATIVE
Glucose, UA: NEGATIVE mg/dL
Hgb urine dipstick: NEGATIVE
Ketones, ur: NEGATIVE mg/dL
Leukocytes,Ua: NEGATIVE
Nitrite: NEGATIVE
Protein, ur: NEGATIVE mg/dL
Specific Gravity, Urine: 1.001 — ABNORMAL LOW (ref 1.005–1.030)
pH: 6 (ref 5.0–8.0)

## 2018-08-15 LAB — COMPREHENSIVE METABOLIC PANEL
ALT: 13 U/L (ref 0–44)
AST: 15 U/L (ref 15–41)
Albumin: 4.5 g/dL (ref 3.5–5.0)
Alkaline Phosphatase: 61 U/L (ref 38–126)
Anion gap: 9 (ref 5–15)
BUN: 8 mg/dL (ref 6–20)
CO2: 24 mmol/L (ref 22–32)
Calcium: 9 mg/dL (ref 8.9–10.3)
Chloride: 104 mmol/L (ref 98–111)
Creatinine, Ser: 0.76 mg/dL (ref 0.44–1.00)
GFR calc Af Amer: >60 mL/min (ref >60)
GFR calc non Af Amer: >60 mL/min (ref >60)
Glucose, Bld: 102 mg/dL — ABNORMAL HIGH (ref 70–99)
Potassium: 3.8 mmol/L (ref 3.5–5.1)
Sodium: 137 mmol/L (ref 135–145)
Total Bilirubin: 0.6 mg/dL (ref 0.3–1.2)
Total Protein: 8.3 g/dL — ABNORMAL HIGH (ref 6.5–8.1)

## 2018-08-15 LAB — PROTIME-INR
INR: 1 (ref 0.8–1.2)
Prothrombin Time: 13.4 seconds (ref 11.4–15.2)

## 2018-08-15 LAB — GLUCOSE, CAPILLARY: Glucose-Capillary: 84 mg/dL (ref 70–99)

## 2018-08-15 LAB — DIFFERENTIAL
Abs Immature Granulocytes: 0.03 10*3/uL (ref 0.00–0.07)
Basophils Absolute: 0.1 10*3/uL (ref 0.0–0.1)
Basophils Relative: 1 %
Eosinophils Absolute: 0 10*3/uL (ref 0.0–0.5)
Eosinophils Relative: 0 %
Immature Granulocytes: 0 %
Lymphocytes Relative: 25 %
Lymphs Abs: 2.9 10*3/uL (ref 0.7–4.0)
Monocytes Absolute: 0.6 10*3/uL (ref 0.1–1.0)
Monocytes Relative: 5 %
Neutro Abs: 7.9 10*3/uL — ABNORMAL HIGH (ref 1.7–7.7)
Neutrophils Relative %: 69 %

## 2018-08-15 LAB — URINE DRUG SCREEN, QUALITATIVE (ARMC ONLY)
Amphetamines, Ur Screen: NOT DETECTED
Barbiturates, Ur Screen: NOT DETECTED
Benzodiazepine, Ur Scrn: NOT DETECTED
Cannabinoid 50 Ng, Ur ~~LOC~~: NOT DETECTED
Cocaine Metabolite,Ur ~~LOC~~: POSITIVE — AB
MDMA (Ecstasy)Ur Screen: NOT DETECTED
Methadone Scn, Ur: NOT DETECTED
Opiate, Ur Screen: NOT DETECTED
Phencyclidine (PCP) Ur S: NOT DETECTED
Tricyclic, Ur Screen: NOT DETECTED

## 2018-08-15 LAB — HCG, QUANTITATIVE, PREGNANCY: hCG, Beta Chain, Quant, S: <1 m[IU]/mL (ref <5)

## 2018-08-15 LAB — ETHANOL: Alcohol, Ethyl (B): <10 mg/dL (ref <10)

## 2018-08-15 LAB — SARS CORONAVIRUS 2 BY RT PCR (HOSPITAL ORDER, PERFORMED IN ~~LOC~~ HOSPITAL LAB): SARS Coronavirus 2: NEGATIVE

## 2018-08-15 LAB — APTT: aPTT: 28 seconds (ref 24–36)

## 2018-08-15 MED ORDER — ASPIRIN 81 MG PO CHEW
324.0000 mg | CHEWABLE_TABLET | Freq: Once | ORAL | Status: AC
  Administered 2018-08-15: 324 mg via ORAL
  Filled 2018-08-15: qty 4

## 2018-08-15 MED ORDER — SODIUM CHLORIDE 0.9 % IV BOLUS
500.0000 mL | Freq: Once | INTRAVENOUS | Status: AC
  Administered 2018-08-15: 10:00:00 500 mL via INTRAVENOUS

## 2018-08-15 NOTE — ED Notes (Signed)
Report given to Laura RN

## 2018-08-15 NOTE — ED Notes (Signed)
Patient transported to MRI 

## 2018-08-15 NOTE — Progress Notes (Signed)
[facility] Tele[facility]urology Consult Services   Date of Service:   08/15/2018 07:52:00  Impression:     .  Rule Out Acute Ischemic Stroke     .  Small Vessel Infarct     .  Left Hemispheric Infarct     .  complex migraine     .  panic attack     .  demyelinating disease  Comments/Sign-Out: Patient with hx of cocaine use, tobacco, asthma who presented after asthma attack, panic attack and subsequent right leg numbness. She is able to ambulate on exam without much difficulty, NIHSS 1 for sensory deficit. Discussed tpa however given mild and nondisabling symptoms patient herself declined therapy. I did explain to her that she is at higher risk for CVA given drug use and smoking. Will obtain MRI brain/MRA head and neck. Asthma and anxiety treatment at discretion of the ED primary team. Patient should follow up with neurology if abnormal MRI scan. Advised to seek psychiatry fu for management of anxiety as outpatient.  Mechanism of Stroke:  cocaine use  Metrics: Last Known Well: 08/15/2018 07:00:00 [facility] Notification Time: 08/15/2018 07:52:00 Arrival Time: 08/15/2018 07:28:00 Symptom Onset During ED Stay: 08/15/2018 08:12:03 Stamp Time: 08/15/2018 07:52:00 Telephone Response Time: 08/15/2018 07:55:00 Time First Login Attempt: 08/15/2018 07:55:00 Video Start Time: 08/15/2018 08:09:47  Symptoms: right LE numbness NIHSS Start Assessment Time: 08/15/2018 08:11:30 Patient is not a candidate for tPA. Patient was not deemed candidate for tPA thrombolytics because of discussed risks and benefits, patient declined. Video End Time: 08/15/2018 08:37:00  CT head showed no acute hemorrhage or acute core infarct.  Clinical Presentation is not Suggestive of Large Vessel Occlusive Disease  ED Physician notified of diagnostic impression and management plan on 08/15/2018 08:30:19  Our recommendations are outlined below.  Recommendations:        .  Neuro Checks     .   DVT Prophylaxis     .  IV Fluids, Normal Saline     .  Head of Bed 30 Degrees     .  Euglycemia and Avoid Hyperthermia (PRN Acetaminophen)     .  Initiate Aspirin  Routine Consultation with Inhouse Neurology for Follow up Care if abnormal MRI brain imaging. (see above)  Sign Out:     .  Discussed with Emergency Department Provider    ------------------------------------------------------------------------------  History of Present Illness: Patient is a 33 year old Female.  Patient was brought by EMS for symptoms of right LE numbness  Patient with hx of anxiety and asthma who presents with right leg numbness after an intense panic attack. She had asthma attack at home and was wheezing and trouble breathing. She could not fall asleep and began having a panic attack. She describes it as the following: she had hot/cold sensation, headache and had numbness on her shin of the right leg. She is having dyspnea and headache. Her HA is localized to the left temporal region 6/10 with nausea and lightheadedness. She does report leg weakness on the right but was ambulating to the restroom in the ED. +cocaine use prior to arrival, smoke.  Last seen normal was within 4.5 hours. There is no history of hemorrhagic complications or intracranial hemorrhage. There is no history of Recent Anticoagulants. There is no history of recent major surgery. There is no history of recent stroke.  Examination: BP(134/93), Blood Glucose(84) 1A: Level of Consciousness - Alert; keenly responsive + 0 1B: Ask Month and Age - Both Questions Right + 0 1C:  Blink Eyes & Squeeze Hands - Performs Both Tasks + 0 2: Test Horizontal Extraocular Movements - Normal + 0 3: Test Visual Fields - No Visual Loss + 0 4: Test Facial Palsy (Use Grimace if Obtunded) - Normal symmetry + 0 5A: Test Left Arm Motor Drift - No Drift for 10 Seconds + 0 5B: Test Right Arm Motor Drift - No Drift for 10 Seconds + 0 6A: Test Left Leg Motor  Drift - No Drift for 5 Seconds + 0 6B: Test Right Leg Motor Drift - No Drift for 5 Seconds + 0 7: Test Limb Ataxia (FNF/Heel-Shin) - No Ataxia + 0 8: Test Sensation - Mild-Moderate Loss: Less Sharp/More Dull + 1 9: Test Language/Aphasia - Normal; No aphasia + 0 10: Test Dysarthria - Normal + 0 11: Test Extinction/Inattention - No abnormality + 0  NIHSS Score: 1  Patient/Family was informed the Neurology Consult would happen via TeleHealth consult by way of interactive audio and video telecommunications and consented to receiving care in this manner.  Due to the immediate potential for life-threatening deterioration due to underlying acute neurologic illness, I spent 35 minutes providing critical care. This time includes time for face to face visit via telemedicine, review of medical records, imaging studies and discussion of findings with providers, the patient and/or family.   Dr Jacqulynn Cadet Dmya Long   [facility] [phone]   Case [id number]

## 2018-08-15 NOTE — ED Notes (Signed)
Patient back from MRI.

## 2018-08-15 NOTE — ED Notes (Signed)
Pt reports that she is having hot/cold flashes with ha and leg numbness at this time - she states that she "partied last night"

## 2018-08-15 NOTE — ED Notes (Signed)
Pt was taken to CT and was able to move bilat ext without difficulty - she transferred herself on and off the table  Pt reported to this nurse and Kathlee Nations RN that she started having the numbness after having a panic attack this am approx 30-40 minutes ago  Pt reports that she did not go to bed last night and has been under a lot of stress

## 2018-08-15 NOTE — ED Provider Notes (Signed)
[facility] Emergency Department Provider Note   ____________________________________________   First MD Initiated Contact with Patient 08/15/18 [phone]     (approximate)  I have reviewed the triage vital signs and the nursing notes.   HISTORY  Chief Complaint Tachycardia and Numbness    HPI Tina Parrish is a 33 y.o. female reports this morning she suddenly started to feel very anxious, and then began to feel like her right leg was tingly and numb and a little heavy.  She also started to feel little tingling in her left arm as well.  No chest pain.  No shortness of breath.  Does report recent dry cough and nasal congestion.  She does not know of any exposure to coronavirus   No history of stroke.  Denies pregnancy.  No pain or distress, reports it feels like a cool feeling in her left hand and right leg  Past Medical History:  Diagnosis Date   Asthma    Depression    Postpartum   Hypertension affecting pregnancy    chronic    Patient Active Problem List   Diagnosis Date Noted   Chronic hypertension with exacerbation during pregnancy 10/14/2014   Indication for care in labor and delivery, antepartum 10/07/2014   Labor and delivery, indication for care 09/23/2014    Past Surgical History:  Procedure Laterality Date   NO PAST SURGERIES      Prior to Admission medications   Not on File    Allergies Patient has no known allergies.  No family history on file.  Social History Social History   Tobacco Use   Smoking status: Heavy Tobacco Smoker    Packs/day: 0.50    Types: Cigarettes   Smokeless tobacco: Never Used  Substance Use Topics   Alcohol use: Yes    Comment: occas.   Drug use: No    Review of Systems Constitutional: No fever/chills Eyes: No visual changes. ENT: No sore throat. Cardiovascular: Denies chest pain. Respiratory: Denies shortness of breath.  Recent dry cough. Gastrointestinal: No abdominal  pain.   Genitourinary: Negative for dysuria. Musculoskeletal: Negative for back pain.  No neck pain. Skin: Negative for rash. Neurological: Negative for headaches.    ____________________________________________   PHYSICAL EXAM:  VITAL SIGNS: ED Triage Vitals  Enc Vitals Group     BP 08/15/18 0733 (!) 134/93     Pulse Rate 08/15/18 0733 (!) 117     Resp 08/15/18 0733 17     Temp 08/15/18 0733 98 F (36.7 C)     Temp Source 08/15/18 0733 Oral     SpO2 08/15/18 0733 100 %     Weight 08/15/18 0731 140 lb (63.5 kg)     Height 08/15/18 0731  (1.651 m)     Head Circumference --      Peak Flow --      Pain Score 08/15/18 0731 0     Pain Loc --      Pain Edu? --      Excl. in GC? --     Constitutional: Alert and oriented. Well appearing and in no acute distress.  She is pleasant.  She does appear to slightly anxious. Eyes: Conjunctivae are normal. Head: Atraumatic. Nose: No congestion/rhinnorhea. Mouth/Throat: Mucous membranes are moist. Neck: No stridor.  Cardiovascular: Minimally tachycardic rate, regular rhythm. Grossly normal heart sounds.  Good peripheral circulation. Respiratory: Normal respiratory effort.  No retractions. Lungs CTAB. Gastrointestinal: Soft and nontender. No distention. Musculoskeletal:   RIGHT  Right upper extremity demonstrates normal strength, good use of all muscles. No edema bruising or contusions of the right shoulder/upper arm, right elbow, right forearm / hand. Full range of motion of the right right upper extremity without pain. No evidence of trauma. Strong radial pulse. Intact median/ulnar/radial neuro-muscular exam.  LEFT Left upper extremity demonstrates normal strength, good use of all muscles. No edema bruising or contusions of the left shoulder/upper arm, left elbow, left forearm / hand. Full range of motion of the left  upper extremity without pain. No evidence of trauma. Strong radial pulse. Intact median/ulnar/radial neuro-muscular  exam.  Lower Extremities  No edema. Normal DP/PT pulses bilateral with good cap refill.  Normal neuro-motor function lower extremities bilateral.  RIGHT Right lower extremity demonstrates limitation in strength with about 4-5 strength in the right lower extremity and some very slight drift. No edema bruising or contusions of the right hip, right knee, right ankle. Full range of motion of the right lower extremity without pain. No pain on axial loading. No evidence of trauma.  LEFT Left lower extremity demonstrates normal strength, good use of all muscles. No edema bruising or contusions of the hip,  knee, ankle. Full range of motion of the left lower extremity without pain. No pain on axial loading. No evidence of trauma.  There are no noted sensory deficits.  Neurologic:  Normal speech and language. No gross focal neurologic deficits are appreciated except NIH score 1.  Skin:  Skin is warm, dry and intact. No rash noted. Psychiatric: Mood and affect are normal. Speech and behavior are normal.  ____________________________________________   LABS (all labs ordered are listed, but only abnormal results are displayed)  Labs Reviewed  CBC - Abnormal; Notable for the following components:      Result Value   WBC 11.6 (*)    MCV 79.5 (*)    MCH 25.7 (*)    RDW 15.9 (*)    All other components within normal limits  DIFFERENTIAL - Abnormal; Notable for the following components:   Neutro Abs 7.9 (*)    All other components within normal limits  COMPREHENSIVE METABOLIC PANEL - Abnormal; Notable for the following components:   Glucose, Bld 102 (*)    Total Protein 8.3 (*)    All other components within normal limits  URINE DRUG SCREEN, QUALITATIVE ([facility] ONLY) - Abnormal; Notable for the following components:   Cocaine Metabolite,Ur [place] POSITIVE (*)    All other components within normal limits  URINALYSIS, ROUTINE W REFLEX MICROSCOPIC - Abnormal; Notable for the following components:    Color, Urine STRAW (*)    APPearance CLEAR (*)    Specific Gravity, Urine 1.001 (*)    All other components within normal limits  SARS CORONAVIRUS 2 (HOSPITAL ORDER, PERFORMED IN [facility])  HCG, QUANTITATIVE, PREGNANCY  ETHANOL  PROTIME-INR  APTT  GLUCOSE, CAPILLARY   ____________________________________________  EKG  Reviewed entered by me at 7:40 AM Heart rate 120 QRS 90 QTc 460 Sinus tachycardia without ischemia ____________________________________________  RADIOLOGY  Mr Angio Head Wo Contrast  Result Date: 08/15/2018 CLINICAL DATA:  Numbness and tingling.  Paresthesia right leg EXAM: MRI HEAD WITHOUT CONTRAST MRA HEAD WITHOUT CONTRAST TECHNIQUE: Multiplanar, multiecho pulse sequences of the brain and surrounding structures were obtained without intravenous contrast. Angiographic images of the head were obtained using MRA technique without contrast. COMPARISON:  CT head 08/15/2018 FINDINGS: MRI HEAD FINDINGS Brain: No acute infarction, hemorrhage, hydrocephalus, extra-axial collection or mass lesion. Normal white  matter. Negative for demyelinating disease. Vascular: Normal arterial flow voids Skull and upper cervical spine: Negative Sinuses/Orbits: Negative Other: None MRA HEAD FINDINGS Both vertebral arteries widely patent to the basilar. Right PICA patent. Left PICA not patent but likely supplied by a prominent left AICA. Basilar widely patent. Superior cerebellar and posterior cerebral arteries patent bilaterally. Right posterior communicating artery patent. Internal carotid artery patent bilaterally without stenosis. Anterior and middle cerebral arteries patent bilaterally. Early bifurcation of left MCA. Negative for cerebral aneurysm. IMPRESSION: Negative MRI head Negative MRA head Electronically Signed   By: Franchot Gallo M.D.   On: 08/15/2018 11:09   Mr Brain Wo Contrast  Result Date: 08/15/2018 CLINICAL DATA:  Numbness and tingling.  Paresthesia right leg  EXAM: MRI HEAD WITHOUT CONTRAST MRA HEAD WITHOUT CONTRAST TECHNIQUE: Multiplanar, multiecho pulse sequences of the brain and surrounding structures were obtained without intravenous contrast. Angiographic images of the head were obtained using MRA technique without contrast. COMPARISON:  CT head 08/15/2018 FINDINGS: MRI HEAD FINDINGS Brain: No acute infarction, hemorrhage, hydrocephalus, extra-axial collection or mass lesion. Normal white matter. Negative for demyelinating disease. Vascular: Normal arterial flow voids Skull and upper cervical spine: Negative Sinuses/Orbits: Negative Other: None MRA HEAD FINDINGS Both vertebral arteries widely patent to the basilar. Right PICA patent. Left PICA not patent but likely supplied by a prominent left AICA. Basilar widely patent. Superior cerebellar and posterior cerebral arteries patent bilaterally. Right posterior communicating artery patent. Internal carotid artery patent bilaterally without stenosis. Anterior and middle cerebral arteries patent bilaterally. Early bifurcation of left MCA. Negative for cerebral aneurysm. IMPRESSION: Negative MRI head Negative MRA head Electronically Signed   By: Franchot Gallo M.D.   On: 08/15/2018 11:09   Dg Chest Portable 1 View  Result Date: 08/15/2018 CLINICAL DATA:  Shortness of breath. EXAM: PORTABLE CHEST 1 VIEW COMPARISON:  None. FINDINGS: The heart size and mediastinal contours are within normal limits. Both lungs are clear. No pneumothorax or pleural effusion is noted. The visualized skeletal structures are unremarkable. IMPRESSION: No active disease. Electronically Signed   By: Marijo Conception M.D.   On: 08/15/2018 09:29   Ct Head Code Stroke Wo Contrast  Result Date: 08/15/2018 CLINICAL DATA:  Code stroke. Right leg weakness since waking up this morning EXAM: CT HEAD WITHOUT CONTRAST TECHNIQUE: Contiguous axial images were obtained from the base of the skull through the vertex without intravenous contrast. COMPARISON:   None. FINDINGS: Brain: No evidence of acute infarction, hemorrhage, hydrocephalus, extra-axial collection or mass lesion/mass effect. Vascular: No hyperdense vessel or unexpected calcification. Skull: Normal. Negative for fracture or focal lesion. Sinuses/Orbits: Negative Other: These results were called by telephone at the time of interpretation on 08/15/2018 at 7:53 am to Dr. Delman Kitten , who verbally acknowledged these results. ASPECTS [facility] Stroke Program Early CT Score) - Ganglionic level infarction (caudate, lentiform nuclei, internal capsule, insula, M1-M3 cortex): 7 - Supraganglionic infarction (M4-M6 cortex): 3 Total score (0-10 with 10 being normal): 10 IMPRESSION: Negative head CT. Electronically Signed   By: Monte Fantasia M.D.   On: 08/15/2018 07:54    ____________________________________________   PROCEDURES  Procedure(s) performed: None  Procedures  Critical Care performed: Yes, see critical care note(s)  CRITICAL CARE Performed by: Delman Kitten   Total critical care time: 30 minutes  Critical care time was exclusive of separately billable procedures and treating other patients.  Critical care was necessary to treat or prevent imminent or life-threatening deterioration.  Critical care was time spent personally by me  on the following activities: development of treatment plan with patient and/or surrogate as well as nursing, discussions with consultants, evaluation of patient's response to treatment, examination of patient, obtaining history from patient or surrogate, ordering and performing treatments and interventions, ordering and review of laboratory studies, ordering and review of radiographic studies, pulse oximetry and re-evaluation of patient's condition.  Stroke activation was delayed initially as a wish to obtain pulses including checking Dopplers lower extremities bilateral to assure there was no ischemia as cause for right lower leg weakness and numbness and "cool  feeling".  Patient had strong pulses in the lower extremities bilateral by Doppler with normal capillary refill.  Code stroke activated thereafter though somewhat unusual as the patient also began to note she is having some left-sided arm tingling as well. ____________________________________________   INITIAL IMPRESSION / ASSESSMENT AND PLAN / ED COURSE  Pertinent labs & imaging results that were available during my care of the patient were reviewed by me and considered in my medical decision making (see chart for details).     Clinical Course as of Aug 15 1554  Tue Aug 15, 2018  [id number] Discussed with tele-neurologist, I will give patient aspirin for now.  Feel it is low likelihood of stroke, tele-neurology does recommend MRI MRA of the brain only.  Also of note, the patient did notes cocaine use immediately prior to symptoms starting this morning.  This may correlate potentially vasospasm.  She has strong bilateral dorsalis pedis and posterior tibial pulses and there is no evidence of ischemia as etiology.  Tele-neurologist recommendations MRI/MRA is negative likely the patient be able to be discharged from their standpoint   [MQ]  1006 Awaiting MRI   [MQ]    Clinical Course User Index [MQ] Sharyn CreamerQuale, Diella Gillingham, MD   ----------------------------------------- 9:18 AM on 08/15/2018 -----------------------------------------  Await MRI.  Will order fluids for slight tachycardia.  With the patient now discussing admitting to cocaine use just prior, certainly this elevates the risk for vasospasm as possible etiology may also explain why she felt anxious, presents somewhat tachycardic.  ----------------------------------------- 1:30 PM on 08/15/2018 ----------------------------------------- Patient resting with no ongoing pain or discomfort.  Numbness and tingling has improved.  She is feeling much better.  Discussed risks of cocaine use and that this is likely related to her symptoms today, patient  understanding of this.  Advised to stop  ----------------------------------------- 3:56 PM on 08/15/2018 -----------------------------------------   Patient asymptomatic, normal vital signs.  Calm and appropriate no ongoing symptoms, no ongoing weakness and she is been able to get up and ambulate with normal gait.  Appropriate for discharge.  Tina Parrish was evaluated in Emergency Department on 08/15/2018 for the symptoms described in the history of present illness. She was evaluated in the context of the global COVID-19 pandemic, which necessitated consideration that the patient might be at risk for infection with the SARS-CoV-2 virus that causes COVID-19. Institutional protocols and algorithms that pertain to the evaluation of patients at risk for COVID-19 are in a state of rapid change based on information released by regulatory bodies including the CDC and federal and state organizations. These policies and algorithms were followed during the patient's care in the ED.  COVID neg  ____________________________________________   FINAL CLINICAL IMPRESSION(S) / ED DIAGNOSES  Final diagnoses:  Cocaine abuse (HCC)  Paresthesia  Vasospasm (HCC)        Note:  This document was prepared using Dragon voice recognition software and may include unintentional dictation errors  Sharyn CreamerQuale, Adessa Primiano, MD 08/15/18 [phone]

## 2018-08-15 NOTE — ED Notes (Signed)
Pt given phone to clear by MRI - advised pt to call when finished with call - Pt rang bell - she had disconnected monitoring equipment, crawled out bottom of bed, ambulated to toilet without assistance, and was able to hold cup and obtain urine sample - pt then climbed back in bed, reconcected all monitoring equipment and then called for nurse to get sample

## 2018-08-15 NOTE — ED Notes (Addendum)
Pt request to ambulate to toilet to void - pt assisted to walk to toilet - she reports that her knee will not bend but is able to ambulate - pt did not give urine sample stating that she could not wait for me to obtain a cup from the drawer for sample Pt is noted to have a dry cough and a lot of sneezing/blwing her nose - pt denies exposure to covid and denies being sick despite sxs

## 2018-08-15 NOTE — ED Notes (Signed)
Patient's mother called to check on patient. Patient unwilling to speak with mother at this time. Informed mother that patient was stable but that I could not give any further information without patient's consent. Mother verbalized understanding

## 2018-08-15 NOTE — ED Notes (Signed)
teleneuro nurse came back on and reported difficulty with neuro logging in and that they will be with [place] as soon as possible

## 2018-08-15 NOTE — ED Notes (Signed)
teleneuro on line

## 2018-08-15 NOTE — Progress Notes (Signed)
   08/15/18 0800  Clinical Encounter Type  Visited With Patient not available;Health care provider  Visit Type Code  Referral From Nurse   Chaplain received a Code Stroke page for the patient. Upon arrival the medical team was assessing the patient. Chaplain maintained pastoral presence outside of the patient's room.

## 2018-08-15 NOTE — ED Triage Notes (Signed)
Pt arrived via ems for report of waking up with wheezing and SHOB - pt has a hx of asthma and anxiety - pt noted to be tachycardic - she is c/o numbness to right lower ext - Dr Jacqualine Code at bedside

## 2018-08-15 NOTE — Discharge Instructions (Addendum)
Stop cocaine use. Cocaine could lead you to having a stroke or heart attack.  Please start taking an baby aspirin (81 mg) by mouth daily.

## 2018-08-15 NOTE — ED Notes (Signed)
Dr Jacqualine Code to room to speak with neurologist

## 2018-08-15 NOTE — ED Notes (Signed)
Pt noted to have thick folded amount of money in hand when nurse walked into room to start IV infusion - pt immediately hid the money under the cover - offered the pt to have security lock up the money and pt refused

## 2018-08-15 NOTE — ED Notes (Addendum)
Neuro exam finished and pt offered alteplase and declined During exam pt now c/o left sided numbness NOT right sided

## 2018-10-30 DIAGNOSIS — E669 Obesity, unspecified: Secondary | ICD-10-CM

## 2018-10-30 DIAGNOSIS — A5139 Other secondary syphilis of skin: Secondary | ICD-10-CM

## 2018-10-30 DIAGNOSIS — F1410 Cocaine abuse, uncomplicated: Secondary | ICD-10-CM

## 2018-10-31 ENCOUNTER — Ambulatory Visit: Payer: Self-pay

## 2018-12-08 ENCOUNTER — Encounter: Payer: Self-pay | Admitting: Advanced Practice Midwife

## 2018-12-08 ENCOUNTER — Other Ambulatory Visit: Payer: Self-pay

## 2018-12-08 ENCOUNTER — Ambulatory Visit: Payer: Medicaid Other | Admitting: Advanced Practice Midwife

## 2018-12-08 DIAGNOSIS — Z113 Encounter for screening for infections with a predominantly sexual mode of transmission: Secondary | ICD-10-CM | POA: Diagnosis not present

## 2018-12-08 DIAGNOSIS — Z3009 Encounter for other general counseling and advice on contraception: Secondary | ICD-10-CM

## 2018-12-08 DIAGNOSIS — Z87891 Personal history of nicotine dependence: Secondary | ICD-10-CM | POA: Insufficient documentation

## 2018-12-08 DIAGNOSIS — F17200 Nicotine dependence, unspecified, uncomplicated: Secondary | ICD-10-CM | POA: Insufficient documentation

## 2018-12-08 DIAGNOSIS — F1210 Cannabis abuse, uncomplicated: Secondary | ICD-10-CM | POA: Insufficient documentation

## 2018-12-08 DIAGNOSIS — F1291 Cannabis use, unspecified, in remission: Secondary | ICD-10-CM | POA: Insufficient documentation

## 2018-12-08 LAB — WET PREP FOR TRICH, YEAST, CLUE
Trichomonas Exam: NEGATIVE
Yeast Exam: NEGATIVE

## 2018-12-08 MED ORDER — METRONIDAZOLE 500 MG PO TABS: 500.0000 mg | ORAL_TABLET | Freq: Two times a day (BID) | ORAL | 0 refills | Status: AC

## 2018-12-08 NOTE — Progress Notes (Signed)
In for STD screening Debera Lat, RN Wet prep reviewed-+BV treated per standing order Debera Lat, RN

## 2018-12-08 NOTE — Progress Notes (Signed)
    STI clinic/screening visit  Subjective:  Tina Parrish is a 33 y.o.G3P3 smoker female being seen today for an STI screening visit. The patient reports they do have symptoms.  Patient has the following medical conditions:   Patient Active Problem List   Diagnosis Date Noted  . 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 Cocaine abuse ([place]) 06/14/2018  . Chronic hypertension with exacerbation during pregnancy 10/14/2014  . Obesity, unspecified 04/12/2014     Chief Complaint  Patient presents with  . SEXUALLY TRANSMITTED DISEASE    HPI  Patient reports intermittent milky d/c.  Last cocaine yesterday (snort).  Last MJ few days ago.  Last ETOH yesterday 3 c. Liquor q weekend.  Last sex 10/20/18 without condom.  LMP 11/21/18.  See flowsheet for further details and programmatic requirements.    The following portions of the patient's history were reviewed and updated as appropriate: allergies, current medications, past medical history, past social history, past surgical history and problem list.  Objective:  There were no vitals filed for this visit.  Physical Exam Constitutional:      Appearance: Normal appearance. She is normal weight.  HENT:     Head: Normocephalic and atraumatic.     Mouth/Throat:     Mouth: Mucous membranes are moist.  Eyes:     Conjunctiva/sclera: Conjunctivae normal.  Neck:     Musculoskeletal: Normal range of motion and neck supple.  Pulmonary:     Effort: Pulmonary effort is normal.     Breath sounds: Normal breath sounds.  Abdominal:     Palpations: Abdomen is soft.     Comments: Soft without tenderness  Genitourinary:    General: Normal vulva.     Exam position: Lithotomy position.     Labia:        Right: No lesion.        Left: No lesion.      Vagina: Vaginal discharge (white creamy, ph<4.5) present.     Cervix: No cervical motion tenderness, friability or erythema.     Uterus: Normal.      Adnexa: Right adnexa  normal and left adnexa normal.     Rectum: Normal.     Comments: Pap done; last pap 05/2014 Lymphadenopathy:     Lower Body: No right inguinal adenopathy. No left inguinal adenopathy.  Skin:    General: Skin is warm and dry.       Assessment and Plan:  Tina Parrish is a 33 y.o. female presenting to the [facility] for STI screening  1. Screening examination for venereal disease Treat wet mount per standing orders Immunization nurse consult Counseled to stop smoking Last pap 05/2014--done today - [facility], [facility] - Gonococcus culture - Syphilis Serology, [place] [facility] - Chlamydia/Gonorrhea [place] [facility] - HIV  LAB - IGP, [facility] HPV - WET PREP FOR [place]     Return if symptoms worsen or fail to improve.  No future appointments.  Herbie Saxon, CNM

## 2018-12-13 LAB — GONOCOCCUS CULTURE

## 2018-12-15 LAB — IGP, APTIMA HPV
HPV Aptima: POSITIVE — AB
PAP Smear Comment: 0

## 2018-12-18 ENCOUNTER — Encounter: Payer: Self-pay | Admitting: Advanced Practice Midwife

## 2018-12-18 DIAGNOSIS — R87619 Unspecified abnormal cytological findings in specimens from cervix uteri: Secondary | ICD-10-CM | POA: Insufficient documentation

## 2019-01-01 ENCOUNTER — Telehealth: Payer: Self-pay | Admitting: General Practice

## 2019-01-01 NOTE — Telephone Encounter (Signed)
calling to see if tr were in  did let patient know to continue to set up myychart account

## 2019-01-01 NOTE — Telephone Encounter (Signed)
TC with patient. Verified ID via password. Discussed HIV/RPR/GC and Chlamydia results.  Patient stated she received pap card; discussed +HPV and need for repeat pap next year. Aileen Fass, RN

## 2019-01-18 NOTE — Addendum Note (Signed)
Addended by: Donnal Moat on: 01/18/2019 11:26 AM   Modules accepted: Orders

## 2019-06-05 ENCOUNTER — Other Ambulatory Visit: Payer: Self-pay

## 2019-06-05 ENCOUNTER — Encounter: Payer: Self-pay | Admitting: Emergency Medicine

## 2019-06-05 ENCOUNTER — Emergency Department: Payer: Medicaid Other

## 2019-06-05 DIAGNOSIS — S2232XA Fracture of one rib, left side, initial encounter for closed fracture: Secondary | ICD-10-CM | POA: Diagnosis not present

## 2019-06-05 DIAGNOSIS — Y92410 Unspecified street and highway as the place of occurrence of the external cause: Secondary | ICD-10-CM | POA: Diagnosis not present

## 2019-06-05 DIAGNOSIS — Y999 Unspecified external cause status: Secondary | ICD-10-CM | POA: Insufficient documentation

## 2019-06-05 DIAGNOSIS — F17210 Nicotine dependence, cigarettes, uncomplicated: Secondary | ICD-10-CM | POA: Insufficient documentation

## 2019-06-05 DIAGNOSIS — S299XXA Unspecified injury of thorax, initial encounter: Secondary | ICD-10-CM | POA: Diagnosis present

## 2019-06-05 DIAGNOSIS — J45909 Unspecified asthma, uncomplicated: Secondary | ICD-10-CM | POA: Insufficient documentation

## 2019-06-05 DIAGNOSIS — Y9351 Activity, roller skating (inline) and skateboarding: Secondary | ICD-10-CM | POA: Diagnosis not present

## 2019-06-05 DIAGNOSIS — S20212A Contusion of left front wall of thorax, initial encounter: Secondary | ICD-10-CM | POA: Insufficient documentation

## 2019-06-05 NOTE — ED Triage Notes (Signed)
Patient ambulatory to triage with steady gait, without difficulty or distress noted, mask in place; pt reports several days fell off hoverboard hitting left lateral ribcage on curb; c/o pain with inspiration & movement

## 2019-06-06 ENCOUNTER — Emergency Department
Admission: EM | Admit: 2019-06-06 | Discharge: 2019-06-06 | Disposition: A | Discharge status: Home / Self Care | Payer: Medicaid Other | Attending: Emergency Medicine | Admitting: Emergency Medicine

## 2019-06-06 DIAGNOSIS — S2232XA Fracture of one rib, left side, initial encounter for closed fracture: Secondary | ICD-10-CM

## 2019-06-06 DIAGNOSIS — S20212A Contusion of left front wall of thorax, initial encounter: Secondary | ICD-10-CM

## 2019-06-06 MED ORDER — IBUPROFEN 600 MG PO TABS
600.0000 mg | ORAL_TABLET | Freq: Once | ORAL | Status: AC
  Administered 2019-06-06: 600 mg via ORAL
  Filled 2019-06-06: qty 1

## 2019-06-06 MED ORDER — LIDOCAINE 5 % EX PTCH
1.0000 | MEDICATED_PATCH | CUTANEOUS | Status: DC
  Administered 2019-06-06: 1 via TRANSDERMAL
  Filled 2019-06-06: qty 1

## 2019-06-06 MED ORDER — LIDOCAINE 5 % EX PTCH: 1.0000 | MEDICATED_PATCH | Freq: Two times a day (BID) | CUTANEOUS | 0 refills | Status: DC

## 2019-06-06 MED ORDER — OXYCODONE-ACETAMINOPHEN 5-325 MG PO TABS: 2.0000 | ORAL_TABLET | Freq: Three times a day (TID) | ORAL | 0 refills | Status: DC | PRN

## 2019-06-06 MED ORDER — OXYCODONE-ACETAMINOPHEN 5-325 MG PO TABS
2.0000 | ORAL_TABLET | Freq: Once | ORAL | Status: AC
  Administered 2019-06-06: 2 via ORAL
  Filled 2019-06-06: qty 2

## 2019-06-06 NOTE — ED Provider Notes (Signed)
[facility] Emergency Department Provider Note  ____________________________________________   First MD Initiated Contact with Patient 06/06/19 0040     (approximate)  I have reviewed the triage vital signs and the nursing notes.   HISTORY  Chief Complaint Rib Injury    HPI Tina Parrish is a 34 y.o. female with medical history as listed below who presents for evaluation of left posterior side and rib pain over the last couple of days.  She had a fall off of a hover board a few days ago and has had persistent pain since then.  Is severely sore to the touch and with deep inspiration and any sort of movement.  Coughing also hurts very severely.  She is not having difficulty breathing.  She did not strike her head, did not lose consciousness, and has no neck pain.  No injuries to her arms or legs.  Ambulatory without difficulty other than the pain and her left side.  Nothing in particular makes it better or worse than it was an acute in onset after her fall.         Past Medical History:  Diagnosis Date  . Asthma   . Depression    Postpartum  . Hypertension affecting pregnancy    chronic    Patient Active Problem List   Diagnosis Date Noted  . Abnormal Pap smear of cervix 12/08/18 neg HPV + 12/18/2018  . Marijuana abuse 12/08/2018  . Smoker 1-7 cpd 12/08/2018  . 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 Cocaine abuse (HCC) last use 12/07/18 (snort) 06/14/2018  . Chronic hypertension with exacerbation during pregnancy 10/14/2014  . Obesity, unspecified 04/12/2014    Past Surgical History:  Procedure Laterality Date  . NO PAST SURGERIES      Prior to Admission medications   Medication Sig Start Date End Date Taking? Authorizing Provider  lidocaine (LIDODERM) 5 % Place 1 patch onto the skin every 12 (twelve) hours. Remove & Discard patch within 12 hours or as directed by MD.  Wynelle Fanny the patch off for 12 hours before applying  a new one. 06/06/19 06/05/20  Loleta Rose, MD  oxyCODONE-acetaminophen (PERCOCET) 5-325 MG tablet Take 2 tablets by mouth every 8 (eight) hours as needed for severe pain. 06/06/19   Loleta Rose, MD    Allergies Patient has no known allergies.  Family History  Problem Relation Age of Onset  . Hypertension Mother   . Diabetes Mother   . Hypertension Brother   . Asthma Brother   . Asthma Son   . Diabetes Maternal Grandmother   . Hypertension Maternal Grandmother   . Diabetes Maternal Grandfather     Social History Social History   Tobacco Use  . Smoking status: Heavy Tobacco Smoker    Packs/day: 0.50    Types: Cigarettes  . Smokeless tobacco: Never Used  Substance Use Topics  . Alcohol use: Yes    Alcohol/week: 3.0 standard drinks    Types: 3 Shots of liquor per week    Comment: q weekend  . Drug use: Yes    Types: Cocaine, Marijuana    Comment: last cocaine 12/07/18; last MJ 12/06/18    Review of Systems Constitutional: No fever/chills Cardiovascular: Denies chest pain. Respiratory: Denies shortness of breath.  Pain with deep inspiration. Musculoskeletal: Left side pain after a fall. Integumentary: Negative for rash. Neurological: Negative for headaches, focal weakness or numbness.   ____________________________________________   PHYSICAL EXAM:  VITAL SIGNS: ED Triage Vitals  Enc Vitals Group     BP 06/05/19 2102 125/86     Pulse Rate 06/05/19 2102 (!) 104     Resp 06/05/19 2102 18     Temp 06/05/19 2102 98.1 F (36.7 C)     Temp Source 06/05/19 2102 Oral     SpO2 06/05/19 2102 100 %     Weight 06/05/19 2102 69.4 kg (153 lb)     Height 06/05/19 2102 1.549 m (5\' 1" )     Head Circumference --      Peak Flow --      Pain Score 06/05/19 2126 8     Pain Loc --      Pain Edu? --      Excl. in GC? --     Constitutional: Alert and oriented.  Comfortable at rest. Eyes: Conjunctivae are normal.  Head: Atraumatic. Nose: No  congestion/rhinnorhea. Mouth/Throat: Patient is wearing a mask. Neck: No stridor.  No meningeal signs.  No tenderness to palpation of the cervical spine. Cardiovascular: Mild tachycardia, regular rhythm. Good peripheral circulation. Grossly normal heart sounds. Respiratory: Normal respiratory effort.  No retractions.  Pain with deep inspiration. Gastrointestinal: Soft and nontender. No distention.  Musculoskeletal: Highly tender to palpation all throughout the left lateral and posterior ribs without any 1 specific area of focal pain.  No hematomas or evidence of contusions externally.  No abrasions or lacerations on the skin. Neurologic:  Normal speech and language. No gross focal neurologic deficits are appreciated.  Skin:  Skin is warm, dry and intact. Psychiatric: Mood and affect are normal. Speech and behavior are normal.  ____________________________________________   LABS (all labs ordered are listed, but only abnormal results are displayed)  Labs Reviewed - No data to display ____________________________________________  EKG  None - EKG not ordered by ED physician ____________________________________________  RADIOLOGY I, 2127, personally viewed and evaluated these images (plain radiographs) as part of my medical decision making, as well as reviewing the written report by the radiologist.  ED MD interpretation: Probable left 11th rib fracture, no pneumothorax or effusion.  Official radiology report(s): DG Ribs Unilateral W/Chest Left  Result Date: 06/05/2019 CLINICAL DATA:  It rib cage pain with inspiration EXAM: LEFT RIBS AND CHEST - 3+ VIEW COMPARISON:  None. FINDINGS: Possible left eleventh anterolateral rib fracture. There is no evidence of pneumothorax or pleural effusion. Both lungs are clear. Heart size and mediastinal contours are within normal limits. IMPRESSION: 1. Negative for pneumothorax or pleural effusion 2. Possible left eleventh rib fracture.  Electronically Signed   By: 06/07/2019 M.D.   On: 06/05/2019 21:50    ____________________________________________   PROCEDURES   Procedure(s) performed (including Critical Care):  Procedures   ____________________________________________   INITIAL IMPRESSION / MDM / ASSESSMENT AND PLAN / ED COURSE  As part of my medical decision making, I reviewed the following data within the electronic MEDICAL RECORD NUMBER Nursing notes reviewed and incorporated, Old chart reviewed, Radiograph reviewed , Notes from prior ED visits and [place] Controlled Substance Database   Differential diagnosis includes, but is not limited to, rib contusion, rib fracture, pneumothorax, less likely PE or pneumonia.  Patient has easily reproducible musculoskeletal pain and a probable rib fracture on x-ray.  I had my usual and customary rib contusion/fracture discussion with her but there is no evidence of an emergent medical condition that would require additional testing at this time.  I am providing an incentive spirometer, Lidoderm patch, Percocet x2 tablets, and prescriptions as listed below.  I am providing information so that she can follow-up as an outpatient and gave my usual and customary return precautions.   ____________________________________________  FINAL CLINICAL IMPRESSION(S) / ED DIAGNOSES  Final diagnoses:  Closed fracture of one rib of left side, initial encounter  Contusion of rib on left side, initial encounter     MEDICATIONS GIVEN DURING THIS VISIT:  Medications  lidocaine (LIDODERM) 5 % 1 patch (1 patch Transdermal Patch Applied 06/06/19 0058)  oxyCODONE-acetaminophen (PERCOCET/ROXICET) 5-325 MG per tablet 2 tablet (2 tablets Oral Given 06/06/19 0058)  ibuprofen (ADVIL) tablet 600 mg (600 mg Oral Given 06/06/19 0059)     ED Discharge Orders         Ordered    oxyCODONE-acetaminophen (PERCOCET) 5-325 MG tablet  Every 8 hours PRN     06/06/19 0055    lidocaine (LIDODERM) 5 %  Every  12 hours     06/06/19 0055          *Please note:  Tina Parrish was evaluated in Emergency Department on 06/06/2019 for the symptoms described in the history of present illness. She was evaluated in the context of the global COVID-19 pandemic, which necessitated consideration that the patient might be at risk for infection with the SARS-CoV-2 virus that causes COVID-19. Institutional protocols and algorithms that pertain to the evaluation of patients at risk for COVID-19 are in a state of rapid change based on information released by regulatory bodies including the CDC and federal and state organizations. These policies and algorithms were followed during the patient's care in the ED.  Some ED evaluations and interventions may be delayed as a result of limited staffing during the pandemic.*  Note:  This document was prepared using Dragon voice recognition software and may include unintentional dictation errors.   Hinda Kehr, MD 06/06/19 [phone]

## 2019-06-06 NOTE — ED Notes (Signed)
ED Provider at bedside. 

## 2019-06-06 NOTE — ED Notes (Signed)
Patient given incentive spirometer and return demonstrated technique

## 2019-06-06 NOTE — Discharge Instructions (Signed)
Your workup today showed that you have a fracture to one or more ribs.  Unfortunately this type of injury hurts but there is no way to fix it immediately; it must heal over time.  Be sure to take plenty of deep breaths so that you get rid of the "bad air" in your lungs.  If you are given a device called an incentive spirometer, please use it as recommended. ° °Unless you have been told by your doctor not to do so, we recommend you take ibuprofen 600 mg 3 times daily with meals for no more than 5 days.  You can also take Tylenol 1000 mg every 6 hours for pain. ° °Take Percocet as prescribed for severe pain. Do not drink alcohol, drive or participate in any other potentially dangerous activities while taking this medication as it may make you sleepy. Do not take this medication with any other sedating medications, either prescription or over-the-counter. If you were prescribed Percocet or Vicodin, do not take these with acetaminophen (Tylenol) as it is already contained within these medications. °  °This medication is an opiate (or narcotic) pain medication and can be habit forming.  Use it as little as possible to achieve adequate pain control.  Do not use or use it with extreme caution if you have a history of opiate abuse or dependence.  If you are on a pain contract with your primary care doctor or a pain specialist, be sure to let them know you were prescribed this medication today from the [facility].  This medication is intended for your use only - do not give any to anyone else and keep it in a secure place where nobody else, especially children, have access to it.  It will also cause or worsen constipation, so you may want to consider taking an over-the-counter stool softener while you are taking this medication. ° °Follow-up at the clinics or with the doctors described in this paperwork.  Return to the emergency department if he develop new or worsening symptoms that concern  you. °

## 2019-10-11 ENCOUNTER — Ambulatory Visit: Payer: Medicaid Other

## 2019-10-11 ENCOUNTER — Other Ambulatory Visit: Payer: Self-pay

## 2019-10-11 ENCOUNTER — Encounter: Payer: Self-pay | Admitting: Family Medicine

## 2019-10-11 VITALS — BP 121/82 | Ht 62.0 in | Wt 161.4 lb

## 2019-10-11 DIAGNOSIS — Z3201 Encounter for pregnancy test, result positive: Secondary | ICD-10-CM

## 2019-10-11 DIAGNOSIS — Z113 Encounter for screening for infections with a predominantly sexual mode of transmission: Secondary | ICD-10-CM

## 2019-10-11 LAB — PREGNANCY, URINE: Preg Test, Ur: POSITIVE — AB

## 2019-10-11 MED ORDER — PRENATAL VITAMIN 27-0.8 MG PO TABS: 1.0000 | ORAL_TABLET | Freq: Every day | ORAL | 0 refills | Status: DC

## 2019-10-11 NOTE — Progress Notes (Signed)
Patient here for STD and PT. Unable to be seen by [facility], referred to maternity clinic as she states she is already pregnant. Pregnancy test positive, confirmation given, PNV given, patient scheduled for new OB appointment. STD screening rescheduled due to time limit in clinic. Marland KitchenBurt Knack, RN

## 2019-10-15 ENCOUNTER — Telehealth: Payer: Self-pay

## 2019-10-15 NOTE — Telephone Encounter (Signed)
Phone call to pt at [phone]. Mailbox full, unable able to leave voicemail.  [facility] RN would like to set up a phone call time to go over things for upcoming New OB visit on 10/19/19, plan to get most of the required information covered prior to visit and in chart.

## 2019-10-16 ENCOUNTER — Ambulatory Visit: Payer: Medicaid Other | Admitting: Physician Assistant

## 2019-10-16 ENCOUNTER — Other Ambulatory Visit: Payer: Self-pay

## 2019-10-16 DIAGNOSIS — Z113 Encounter for screening for infections with a predominantly sexual mode of transmission: Secondary | ICD-10-CM

## 2019-10-16 DIAGNOSIS — Z202 Contact with and (suspected) exposure to infections with a predominantly sexual mode of transmission: Secondary | ICD-10-CM

## 2019-10-16 LAB — OB RESULTS CONSOLE GC/CHLAMYDIA
Chlamydia: POSITIVE
Gonorrhea: POSITIVE

## 2019-10-16 LAB — WET PREP FOR TRICH, YEAST, CLUE
Trichomonas Exam: NEGATIVE
Yeast Exam: NEGATIVE

## 2019-10-16 MED ORDER — PENICILLIN G BENZATHINE 1200000 UNIT/2ML IM SUSP
2.4000 10*6.[IU] | Freq: Once | INTRAMUSCULAR | Status: AC
  Administered 2019-10-16: 2.4 10*6.[IU] via INTRAMUSCULAR

## 2019-10-16 NOTE — Telephone Encounter (Signed)
Phone call to pt at [phone]. Mailbox is still full, unable able to leave voicemail.  [facility] RN would like to set up a phone call time to go over things for upcoming New OB visit on 10/19/19, plan to get most of the required information covered prior to visit and in chart

## 2019-10-16 NOTE — Progress Notes (Signed)
Patient into clinic stating that she was here last week and had pregnancy test done and she is 2 months pregnant.  States that one of her partners had told her that he had other partner/s and one of them "had something" but she did not know what that was when she was here.  Reports that she found out that she has been exposed to Syphilis and would like treatment today. Reports that she has had Syphilis before and was treated at the time.  Per chart review, patient had secondary Syphilis in 2020, with Reactive RPR 1:128 on 06/14/2018 when she was treated with Bicillin 2.4 mu IM and at titer recheck on 12/08/2018 her tier was 1:4  Reports that she has had some slight itching but she thinks that is due to recent change in her soap.   States that she has a NOB appointment on Friday, so would like to have her GC/Chlamydia and wet mount done today but wait on blood work until her appointment on Friday.  Will treat her as a contact to Syphilis today with Bicillin 2.4 mu IM.  Counseled patient to have any other partners come for screening and possible treatment.  No sex for 14 days.  Counseled that she will be monitored during pregnancy for Syphilis titers.

## 2019-10-17 NOTE — Telephone Encounter (Signed)
Phone call to pt.  Received message that subscriber that was called is not available or has travelled outside of coverage area. Unable to leave message.

## 2019-10-18 NOTE — Telephone Encounter (Signed)
Phone call to pt at [phone]. No message, no answer, no voicemail pick up; phone just continued to ring for over 2 minutes then received busy signal. Unable to leave message.

## 2019-10-18 NOTE — Progress Notes (Signed)
Wet mount reviewed with provider and no treatment needed for wet mount per Sadie Haber, PA verbal order. Pt received Bicillin 2.51mu IM and pt tolerated well. Counseled pt per provider and pt states understanding. Provider orders completed.

## 2019-10-19 NOTE — Telephone Encounter (Signed)
Client has [facility] IP appt scheduled for this afternoon. Jossie Ng, RN

## 2019-10-25 ENCOUNTER — Telehealth: Payer: Self-pay | Admitting: Family Medicine

## 2019-10-25 NOTE — Telephone Encounter (Signed)
Call to patient to discuss positive TR.  Patient verified by password.  Patient informed of + CT/ GC result.  Patient schedueld for NOB &  treatment appointment on 10/26/19. Patient verbalizes understanding at this time and has no further questions.  Wendi Snipes, RN

## 2019-10-26 ENCOUNTER — Ambulatory Visit: Payer: Medicaid Other

## 2019-10-29 ENCOUNTER — Ambulatory Visit: Payer: Medicaid Other | Admitting: Family Medicine

## 2019-10-29 ENCOUNTER — Other Ambulatory Visit: Payer: Self-pay

## 2019-10-29 VITALS — BP 111/80 | HR 87 | Temp 98.9°F | Ht 61.5 in | Wt 160.2 lb

## 2019-10-29 DIAGNOSIS — A5139 Other secondary syphilis of skin: Secondary | ICD-10-CM

## 2019-10-29 DIAGNOSIS — Z8679 Personal history of other diseases of the circulatory system: Secondary | ICD-10-CM

## 2019-10-29 DIAGNOSIS — A549 Gonococcal infection, unspecified: Secondary | ICD-10-CM | POA: Diagnosis not present

## 2019-10-29 DIAGNOSIS — J45909 Unspecified asthma, uncomplicated: Secondary | ICD-10-CM

## 2019-10-29 DIAGNOSIS — K219 Gastro-esophageal reflux disease without esophagitis: Secondary | ICD-10-CM | POA: Insufficient documentation

## 2019-10-29 DIAGNOSIS — O0990 Supervision of high risk pregnancy, unspecified, unspecified trimester: Secondary | ICD-10-CM | POA: Diagnosis not present

## 2019-10-29 DIAGNOSIS — F1411 Cocaine abuse, in remission: Secondary | ICD-10-CM

## 2019-10-29 HISTORY — DX: Gonococcal infection, unspecified: A54.9

## 2019-10-29 LAB — URINALYSIS
Bilirubin, UA: NEGATIVE
Glucose, UA: NEGATIVE
Ketones, UA: NEGATIVE
Nitrite, UA: NEGATIVE
Protein,UA: NEGATIVE
RBC, UA: NEGATIVE
Specific Gravity, UA: 1.02 (ref 1.005–1.030)
Urobilinogen, Ur: 0.2 mg/dL (ref 0.2–1.0)
pH, UA: 7 (ref 5.0–7.5)

## 2019-10-29 LAB — WET PREP FOR TRICH, YEAST, CLUE
Trichomonas Exam: NEGATIVE
Yeast Exam: NEGATIVE

## 2019-10-29 LAB — HEMOGLOBIN, FINGERSTICK: Hemoglobin: 11.4 g/dL (ref 11.1–15.9)

## 2019-10-29 MED ORDER — CEFTRIAXONE SODIUM 500 MG IJ SOLR
500.0000 mg | Freq: Once | INTRAMUSCULAR | Status: AC
  Administered 2019-10-29: 500 mg via INTRAMUSCULAR

## 2019-10-29 MED ORDER — ALBUTEROL SULFATE HFA 108 (90 BASE) MCG/ACT IN AERS: 2.0000 | INHALATION_SPRAY | Freq: Four times a day (QID) | RESPIRATORY_TRACT | 2 refills | Status: DC | PRN

## 2019-10-29 MED ORDER — AZITHROMYCIN 500 MG PO TABS
1000.0000 mg | ORAL_TABLET | Freq: Once | ORAL | Status: AC
  Administered 2019-10-29: 1000 mg via ORAL

## 2019-10-29 MED ORDER — FLOVENT HFA 110 MCG/ACT IN AERO: 1.0000 | INHALATION_SPRAY | Freq: Two times a day (BID) | RESPIRATORY_TRACT | 6 refills | Status: DC

## 2019-10-29 MED ORDER — ASPIRIN EC 81 MG PO TBEC: 81.0000 mg | DELAYED_RELEASE_TABLET | Freq: Every day | ORAL | 4 refills | Status: DC

## 2019-10-29 MED ORDER — PROMETHAZINE HCL 25 MG PO TABS: ORAL_TABLET | ORAL | 0 refills | Status: DC

## 2019-10-29 NOTE — Progress Notes (Signed)
Pt chart abstracted from phone interview last week. Pt states she needs an inhaler. C/O extremely bad acid reflux. Denies visit to ER or any other Dr offices since being told she was pregnant. Needs tx for GC and chlamydia.

## 2019-10-29 NOTE — Progress Notes (Signed)
Both requests for U/S faxed to [facility] with confirmation received. Peak flows of 250, 250, 270 shown to provider; device sent home with pt. Wet mount, Hgb, and Urine dip reviewed by provider; no tx today. Pt treated for GC and Chlamydia per provider orders. Pt to confirm with sexual partner that he was evaluated/treated properly for syphilis. Provider orders completed.

## 2019-10-29 NOTE — Progress Notes (Signed)
[facility] HEALTH DEPT [facility] [address] [place] [phone]  INITIAL PRENATAL VISIT NOTE  Subjective:  Tina Parrish is a 34 y.o. G4P3003 at [redacted]w[redacted]d being seen today to start prenatal care at the [facility].  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  Pt presents for initial OB visit. She states she is feeling content about this pregnancy. She lives with her mother and 3 children ages 12, 43 and 46. She does not currently work outside the home. Her partner will be involved in this pregnancy, works as a Scientist, forensic. Physically she is having a lot of nausea, vomiting daily/every other day. She has hx of asthma, requests inhaler as she has been SOB w/increased movement lately. Denies chest pain. Her last pap was 11/2018 = NIL, HPV+. She quit tobacco about 1 month ago, plan is to remain quit for pregnancy. She has hx of cocaine and marijuana use, last use for both ~1 yr ago, goal is to not use substances during pregnancy. Her prior pregnancies were complicated by HTN, which began with 2nd pregnancy and continued a few months after delivery, then resurfaced during 3rd pregnancy. She has not been taking any medications for HTN since last pregnancy ended. She declines all genetic testing.    Contractions: Not present. Vag. Bleeding: None.  Movement: Absent. Denies leaking of fluid.   BP Readings from Last 3 Encounters:  10/29/19 111/80  10/11/19  121/82  06/06/19 (!) 122/95    Indications for ASA therapy (per uptodate) One of the following: Previous pregnancy with preeclampsia, especially early onset and with an adverse outcome No Multifetal gestation No Chronic hypertension Yes Type 1 or 2 diabetes mellitus No Chronic kidney disease No Autoimmune disease (antiphospholipid syndrome, systemic lupus erythematosus) No  Two or more of the following: Nulliparity No Obesity (body mass index >30 kg/m2) No Family history of preeclampsia in mother or sister No Age ?35 years No Sociodemographic characteristics (African American race, low socioeconomic level) Yes Personal risk factors (eg, previous pregnancy with low birth weight or small for gestational age infant, previous adverse pregnancy outcome [eg, stillbirth], interval >10 years between pregnancies) No   The following portions of the patient's history were reviewed and updated as appropriate: allergies, current medications, past family history, past medical history, past social history, past surgical history and problem list. Problem list updated.  Objective:   Vitals:   10/29/19 1407 10/29/19 1412  BP: 111/80   Pulse: 87   Temp: 98.9 F (37.2 C)   Weight: 160 lb 3.2 oz (72.7 kg)   Height:  5' 1.5" (1.562 m)   PO2: 100%   Fetal Status: Fetal Heart Rate (bpm): 150 Fundal Height: 16 cm Movement: Absent  Presentation: Undeterminable   Physical Exam Vitals and nursing note reviewed.  Constitutional:      General: She is not in acute distress.    Appearance: Normal appearance. She is well-developed.  HENT:     Head: Normocephalic and atraumatic.     Right Ear: External ear normal.     Left Ear:  External ear normal.     Nose: Nose normal. No congestion or rhinorrhea.     Mouth/Throat:     Lips: Pink.     Mouth: Mucous membranes are moist.     Dentition: Normal dentition. No dental caries.     Pharynx: Oropharynx is clear. Uvula midline.  Eyes:     General: No  scleral icterus.    Conjunctiva/sclera: Conjunctivae normal.  Neck:     Thyroid: No thyroid mass or thyromegaly.  Cardiovascular:     Rate and Rhythm: Normal rate.     Pulses: Normal pulses.     Comments: Extremities are warm and well perfused Pulmonary:     Effort: Pulmonary effort is normal. No respiratory distress.     Breath sounds: Normal breath sounds. No wheezing.  Chest:     Breasts: Breasts are symmetrical.        Right: Normal. No mass, nipple discharge or skin change.        Left: Normal. No mass, nipple discharge or skin change.  Abdominal:     General: Abdomen is flat.     Palpations: Abdomen is soft.     Tenderness: There is no abdominal tenderness.     Comments: Gravid   Genitourinary:    General: Normal vulva.     Exam position: Lithotomy position.     Pubic Area: No rash.      Labia:        Right: No rash.        Left: No rash.      Vagina: Vaginal discharge (white, ph<4.5) present.     Cervix: Friability present. No cervical motion tenderness.     Uterus: Normal. Enlarged (Gravid 16 wk size ). Not tender.      Adnexa: Right adnexa normal and left adnexa normal.     Rectum: Normal. No external hemorrhoid.  Musculoskeletal:     Right lower leg: No swelling or tenderness. No edema.     Left lower leg: No swelling or tenderness. No edema.  Lymphadenopathy:     Upper Body:     Right upper body: No axillary adenopathy.     Left upper body: No axillary adenopathy.  Skin:    General: Skin is warm.     Capillary Refill: Capillary refill takes less than 2 seconds.  Neurological:     Mental Status: She is alert.     Assessment and Plan:  Pregnancy: G4P3003 at [redacted]w[redacted]d   1. Supervision of high risk pregnancy, antepartum -Initial prenatal visit today. -Size > dates, will get dating [place]. Anatomy referral also placed today for [facility]. -Pt declines genetic screening. -Persistant nausea, vomiting daily. Discussed dietary suggestions including small, frequent  meals with protein and OTC remedies including ginger, SeaBands and vitamin B6. Offered rx antiemetic, pt accepts, promethazine sent to pt's pharmacy. Advised to let us know if sx persist or worsen.  -PHQ-9 score is 0. Pt informed of counselor available if desired at any time.  -Routine dental care recommended during pregnancy, handout of local dentists provided. -Reviewed risk factors and aspirin is indicated, pt accepts -Pap done today -Covid vaccine discussed, pt declines - HIV Antibody (routine testing w rflx) - Prenatal profile without Varicella or Rubella - HCV Ab w Reflex to Quant PCR - Urine Culture - Lead, blood (adult age 70 yrs or greater) - WET PREP FOR TRICH, YEAST, CLUE - Hemoglobin, venipuncture - Urinalysis (Urine Dip) - promethazine (PHENERGAN) 25 MG tablet; Take 1/2 tablet (12.5mg ) or 1  tablet (25mg ) every 4-6 hours as needed for nausea and vomiting.  Dispense: 15 tablet; Refill: 0  2. History of chronic hypertension with exacerbation during pregnancy -Began with 2nd pregnancy. Per pt it and continued a few months after 2nd pregnancy delivery, then resurfaced during 3rd pregnancy. BP wnl today, states she has taken no medications for BP since last pregnancy ended. Pre-pregnancy BP 5 months ago was 122/95. -Will get baseline labs today, consult if transfer to [facility] needed.  - aspirin EC 81 MG tablet; Take 1 tablet (81 mg total) by mouth daily. Take 1 tablet (81mg  total) by mouth daily from weeks 12-36 of pregnancy. Swallow whole.  Dispense: 30 tablet; Refill: 4 - Protein / creatinine ratio, urine  (Spot) - PIH Panel ([facility] )  3. Uncomplicated asthma, unspecified asthma severity, unspecified whether persistent -Exam normal though PEF results less than lower limits normal. Mild persistent by pt hx. Discussed with Dr. - rx sent to pt's pharmacy for albuterol prn and flovent BID. No s/sx of PE. Advised to RTC if symptoms worsen or not controlled w/medications. -  albuterol (VENTOLIN HFA) 108 (90 Base) MCG/ACT inhaler; Inhale 2 puffs into the lungs every 6 (six) hours as needed for wheezing or shortness of breath.  Dispense: 8 g; Refill: 2 - fluticasone (FLOVENT HFA) 110 MCG/ACT inhaler; Inhale 1 puff into the lungs 2 (two) times daily.  Dispense: 1 each; Refill: 6  4. Gastroesophageal reflux disease, unspecified whether esophagitis present -Advised eating small meals and avoiding common trigger foods/drinks, elevating head at nighttime, not reclining immediately after eating, and using Tums as needed. If that fails advised trial of cimetidine (Tagamet HB) 200mg  twice per day. Handout with information given as well.   5. Secondary syphilis of skin or mucous membranes -Pt has past hx of syphilis infection in 2020 and was also treated as a contact to syphilis on 10/16/19 w/Bicillin 2.55mu IM. Getting titer today.   6. Gonorrhea -Results from 10/16/19 STI screen were positive for GC/CT, pt has not yet received tx. Will treat today as below, advised partner tx.  -Will need TOC and 3rd trimester screen as well.  - cefTRIAXone (ROCEPHIN) injection 500 mg - azithromycin (ZITHROMAX) tablet 1,000 mg  7. History of cocaine and marijuana abuse (HCC) -Encouraged continued cessation. Pt agrees to UDS today.  10/18/19 Drug Screen    Discussed overview of care and coordination with inpatient delivery practices including [facility], [facility] and [facility].     Preterm labor symptoms and general obstetric precautions including but not limited to vaginal bleeding, contractions, leaking of fluid and fetal movement were reviewed in detail with the patient.  Please refer to After Visit Summary for other counseling recommendations.   Return in about 4 weeks (around 11/26/2019) for routine prenatal care.  No future appointments.  Cristine Polio, PA-C

## 2019-10-30 LAB — AST+BUN+CREAT+LD+URIC A+HGB...
AST: 14 IU/L (ref 0–40)
BUN: 6 mg/dL (ref 6–20)
Creatinine, Ser: 0.55 mg/dL — ABNORMAL LOW (ref 0.57–1.00)
GFR calc Af Amer: 145 mL/min/{1.73_m2} (ref >59)
GFR calc non Af Amer: 125 mL/min/{1.73_m2} (ref >59)
Hematocrit: 34.7 % (ref 34.0–46.6)
Hemoglobin: 11.3 g/dL (ref 11.1–15.9)
LDH: 188 IU/L (ref 119–226)
Platelets: 252 10*3/uL (ref 150–450)
Uric Acid: 3.5 mg/dL (ref 2.6–6.2)

## 2019-10-30 LAB — LEAD, BLOOD (ADULT >= 16 YRS): Lead-Whole Blood: <1 ug/dL (ref 0–4)

## 2019-10-30 LAB — PROTEIN / CREATININE RATIO, URINE
Creatinine, Urine: 127.7 mg/dL
Protein, Ur: 19 mg/dL
Protein/Creat Ratio: 149 mg/g creat (ref 0–200)

## 2019-10-30 NOTE — Addendum Note (Signed)
Addended byTracey Harries on: 10/30/2019 02:55 PM   Modules accepted: Orders

## 2019-10-31 LAB — IGP, APTIMA HPV
HPV Aptima: NEGATIVE
PAP Smear Comment: 0

## 2019-11-01 LAB — CBC/D/PLT+RPR+RH+ABO+AB SCR
Antibody Screen: NEGATIVE
Basophils Absolute: 0.1 10*3/uL (ref 0.0–0.2)
Basos: 0 %
EOS (ABSOLUTE): 0.1 10*3/uL (ref 0.0–0.4)
Eos: 1 %
Hematocrit: 34.9 % (ref 34.0–46.6)
Hemoglobin: 11.3 g/dL (ref 11.1–15.9)
Hepatitis B Surface Ag: NEGATIVE
Immature Grans (Abs): 0 10*3/uL (ref 0.0–0.1)
Immature Granulocytes: 0 %
Lymphocytes Absolute: 2.8 10*3/uL (ref 0.7–3.1)
Lymphs: 23 %
MCH: 26.5 pg — ABNORMAL LOW (ref 26.6–33.0)
MCHC: 32.4 g/dL (ref 31.5–35.7)
MCV: 82 fL (ref 79–97)
Monocytes Absolute: 0.7 10*3/uL (ref 0.1–0.9)
Monocytes: 6 %
Neutrophils Absolute: 8.4 10*3/uL — ABNORMAL HIGH (ref 1.4–7.0)
Neutrophils: 70 %
Platelets: 260 10*3/uL (ref 150–450)
RBC: 4.26 x10E6/uL (ref 3.77–5.28)
RDW: 13 % (ref 11.7–15.4)
RPR Ser Ql: REACTIVE — AB
Rh Factor: POSITIVE
WBC: 12 10*3/uL — ABNORMAL HIGH (ref 3.4–10.8)

## 2019-11-01 LAB — 789231 7+OXYCODONE-BUND
Amphetamines, Urine: NEGATIVE ng/mL
BENZODIAZ UR QL: NEGATIVE ng/mL
Barbiturate screen, urine: NEGATIVE ng/mL
Cocaine (Metab.): NEGATIVE ng/mL
OPIATE SCREEN URINE: NEGATIVE ng/mL
Oxycodone/Oxymorphone, Urine: NEGATIVE ng/mL
PCP Quant, Ur: NEGATIVE ng/mL

## 2019-11-01 LAB — HCV INTERPRETATION

## 2019-11-01 LAB — HCV AB W REFLEX TO QUANT PCR: HCV Ab: <0.1 s/co ratio (ref 0.0–0.9)

## 2019-11-01 LAB — CANNABINOID CONFIRMATION, UR
CANNABINOIDS: POSITIVE — AB
Carboxy THC GC/MS Conf: 40 ng/mL

## 2019-11-01 LAB — RPR, QUANT+TP ABS (REFLEX)
Rapid Plasma Reagin, Quant: 1:2 {titer} — ABNORMAL HIGH
T Pallidum Abs: REACTIVE — AB

## 2019-11-01 LAB — HIV ANTIBODY (ROUTINE TESTING W REFLEX): HIV Screen 4th Generation wRfx: NONREACTIVE

## 2019-11-01 NOTE — Progress Notes (Signed)
Reviewed note by RN re:  Patient visit.  Agree with plan.

## 2019-11-02 ENCOUNTER — Telehealth: Payer: Self-pay | Admitting: Family Medicine

## 2019-11-02 ENCOUNTER — Telehealth: Payer: Self-pay

## 2019-11-02 ENCOUNTER — Encounter: Payer: Self-pay | Admitting: Family Medicine

## 2019-11-02 ENCOUNTER — Other Ambulatory Visit: Payer: Self-pay | Admitting: Obstetrics and Gynecology

## 2019-11-02 ENCOUNTER — Ambulatory Visit: Payer: Self-pay

## 2019-11-02 NOTE — Telephone Encounter (Signed)
Due to known diagnosis of chronic hypertension I am requesting a transfer of prenatal care to [facility] (pt's delivery group of choice). I spoke with pt by phone and she is in agreement with plan.  Paper referral given to [facility] RN 11/02/19.

## 2019-11-02 NOTE — Telephone Encounter (Signed)
Referral for transfer of care to [facility] (due to chronic HT)  faxed with demographic info / records and fax confirmation received. Client has [facility] [place] appt toda at 1130. Call to Virgilio Belling receptionist and notifed her of above. Requested client be scheduled her [facility] clinic appt while at [place]. Jossie Ng, RN

## 2019-11-05 LAB — URINE CULTURE

## 2019-11-06 ENCOUNTER — Other Ambulatory Visit: Payer: Self-pay | Admitting: Advanced Practice Midwife

## 2019-11-06 ENCOUNTER — Other Ambulatory Visit: Payer: Self-pay | Admitting: Obstetrics and Gynecology

## 2019-11-06 ENCOUNTER — Encounter: Payer: Self-pay | Admitting: Advanced Practice Midwife

## 2019-11-06 ENCOUNTER — Ambulatory Visit (INDEPENDENT_AMBULATORY_CARE_PROVIDER_SITE_OTHER): Payer: Medicaid Other

## 2019-11-06 ENCOUNTER — Other Ambulatory Visit: Payer: Self-pay

## 2019-11-06 DIAGNOSIS — R825 Elevated urine levels of drugs, medicaments and biological substances: Secondary | ICD-10-CM | POA: Insufficient documentation

## 2019-11-06 DIAGNOSIS — O2340 Unspecified infection of urinary tract in pregnancy, unspecified trimester: Secondary | ICD-10-CM | POA: Insufficient documentation

## 2019-11-06 DIAGNOSIS — Z3A13 13 weeks gestation of pregnancy: Secondary | ICD-10-CM

## 2019-11-06 MED ORDER — SULFAMETHOXAZOLE-TRIMETHOPRIM 800-160 MG PO TABS: 1.0000 | ORAL_TABLET | Freq: Two times a day (BID) | ORAL | 0 refills | Status: DC

## 2019-11-07 ENCOUNTER — Telehealth: Payer: Self-pay | Admitting: Obstetrics & Gynecology

## 2019-11-07 ENCOUNTER — Telehealth: Payer: Self-pay

## 2019-11-07 NOTE — Telephone Encounter (Signed)
1. Please see additional phone note regarding need for UTI treatment (to pick up antibiotic at pharmacy)  2. Client transfer of care to [facility] due to [facility]. Per call from Huntley Dec this am, [facility] does not accept her Medicaid insurance - [employer] (out-of-network for [facility]). Per Huntley Dec, client needs to change to another provider that accepts insurance, see if can change insurance provider to one [facility] accepts or transfer to [facility]. Hazle Coca CNM notified of above. Call to client - no answer and left voice mail message on emergency contact's voicemail (mother) to have client call us regarding above. Jossie Ng, RN

## 2019-11-07 NOTE — Telephone Encounter (Signed)
[facility] referring for Transfer [facility] care due to chronic hypertension. Patient's insurance is not in network advise patient to contact [place] social work or [place] or [phone] to have plan changes to [employer] health blue, UHC community plan and [employer]. Contact Lynette with [facility] about insurance information.

## 2019-11-07 NOTE — Telephone Encounter (Signed)
Client needs UTI treatment and E. Sciora CNM has e-prescribed antibiotic. Call to client with above info - no answer / no voicemail. Call to emergency contact and requested assistance in contacting client to have her call [facility] for info regarding need for UTI treatment. Jossie Ng, RN

## 2019-11-09 ENCOUNTER — Other Ambulatory Visit: Payer: Self-pay

## 2019-11-09 ENCOUNTER — Emergency Department
Admission: EM | Admit: 2019-11-09 | Discharge: 2019-11-09 | Disposition: A | Discharge status: Home / Self Care | Payer: Medicaid Other | Attending: Emergency Medicine | Admitting: Emergency Medicine

## 2019-11-09 ENCOUNTER — Telehealth: Payer: Self-pay | Admitting: Family Medicine

## 2019-11-09 DIAGNOSIS — F17210 Nicotine dependence, cigarettes, uncomplicated: Secondary | ICD-10-CM | POA: Diagnosis not present

## 2019-11-09 DIAGNOSIS — S0990XA Unspecified injury of head, initial encounter: Secondary | ICD-10-CM | POA: Insufficient documentation

## 2019-11-09 DIAGNOSIS — I10 Essential (primary) hypertension: Secondary | ICD-10-CM | POA: Diagnosis not present

## 2019-11-09 DIAGNOSIS — Z7982 Long term (current) use of aspirin: Secondary | ICD-10-CM | POA: Insufficient documentation

## 2019-11-09 DIAGNOSIS — Z3A14 14 weeks gestation of pregnancy: Secondary | ICD-10-CM | POA: Insufficient documentation

## 2019-11-09 DIAGNOSIS — W228XXA Striking against or struck by other objects, initial encounter: Secondary | ICD-10-CM | POA: Diagnosis not present

## 2019-11-09 DIAGNOSIS — O99891 Other specified diseases and conditions complicating pregnancy: Secondary | ICD-10-CM | POA: Diagnosis present

## 2019-11-09 MED ORDER — ACETAMINOPHEN 325 MG PO TABS
650.0000 mg | ORAL_TABLET | Freq: Once | ORAL | Status: AC
  Administered 2019-11-09: 650 mg via ORAL
  Filled 2019-11-09: qty 2

## 2019-11-09 NOTE — Telephone Encounter (Signed)
Returned TC to patient, "your call did not go through", unable to LM.Marland KitchenBurt Knack, RN

## 2019-11-09 NOTE — Telephone Encounter (Signed)
Call to [facility] and UTI antibiotic e-prescribed has not yet been picked up. Call to client and counseled regarding UTI, need to start antibiotic ASAP and that ready at her pharmacy. States she will get medicine today. Client informed [facility] will not take her Medicaid (she was aware of this) and encouraged to call [facility], [facility] and [facility] to ascertain which Medicaid they accept. Client also encouraged to call her Medicaid worker if none of local OB providers accept her Medicaid to see if can be changed. Per client, unable to go to [facility] due to trarnsportation issues. Jossie Ng, RN

## 2019-11-09 NOTE — ED Provider Notes (Signed)
[facility] Emergency Department Provider Note  ____________________________________________  Time seen: Approximately 12:12 PM  I have reviewed the triage vital signs and the nursing notes.   HISTORY  Chief Complaint Head Injury    HPI Tina Parrish is a 34 y.o. female that is [redacted] weeks pregnant that presents to the emergency department for evaluation of headache this morning.  Patient hit her head on the corner of the table.  She did not lose consciousness.  No dizziness or vomiting.  She noticed a little bleeding where she hit her head.  She has had a headache.  She has not had any problems or concerns with the pregnancy.  No abdominal pain or vaginal bleeding. She has not taken any medications for headache.   Past Medical History:  Diagnosis Date   Asthma    Depression    Postpartum   Hypertension affecting pregnancy    chronic    Patient Active Problem List   Diagnosis Date Noted   Positive urine drug screen 10/29/19 MJ 11/06/2019   UTI (urinary tract infection) during pregnancy 10/29/19 E.Coli 50-100,000 & Serratia Marcescens 11/06/2019   Asthma 10/29/2019   Gastroesophageal reflux disease 10/29/2019   Gonorrhea and chlamydia (treated 10/2019) 10/29/2019   Supervision of high risk pregnancy, antepartum 10/29/2019   Abnormal Pap smear of cervix 12/18/2018   Former smoker (last use 09/2019) 12/08/2018   Secondary syphilis of skin or mucous membranes 06/14/2018   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 Authorizing Provider  albuterol (VENTOLIN HFA) 108 (90 Base) MCG/ACT inhaler Inhale 2 puffs into the lungs every 6 (six) hours as needed for wheezing  or shortness of breath. 10/29/19   Staples, Hulda Humphrey, PA-C  aspirin EC 81 MG tablet Take 1 tablet (81 mg total) by mouth daily. Take 1 tablet (81mg  total) by mouth daily from weeks 12-36 of pregnancy. Swallow whole. 10/29/19   Staples, 10/31/19 L, PA-C  fluticasone (FLOVENT HFA) 110 MCG/ACT inhaler Inhale 1 puff into the lungs 2 (two) times daily. 10/29/19   Staples, 10/31/19, PA-C  Prenatal Vit-Fe Fumarate-FA (PRENATAL VITAMIN) 27-0.8 MG TABS Take 1 tablet by mouth daily. 10/11/19   10/13/19, MD  promethazine (PHENERGAN) 25 MG tablet Take 1/2 tablet (12.5mg ) or 1 tablet (25mg ) every 4-6 hours as needed for nausea and vomiting. 10/29/19   Staples, L, PA-C  sulfamethoxazole-trimethoprim (BACTRIM DS) 800-160 MG tablet Take 1 tablet by mouth 2 (two) times daily. 11/06/19   Eileen Stanford, CNM    Allergies Patient has no known allergies.  Family History  Problem Relation Age of Onset   Hypertension Mother    Diabetes Mother    Hypertension Brother    Asthma Brother    Asthma Son    Diabetes Maternal Grandmother    Hypertension Maternal Grandmother    Diabetes Maternal Grandfather    Diabetes Maternal Aunt     Social History Social History   Tobacco Use   Smoking status: Current Some Day Smoker    Packs/day: 0.50    Types: Cigarettes   Smokeless tobacco: Never Used   Tobacco comment: denies exposure to secondhand smoke  Vaping Use   Vaping  Use: Never used  Substance Use Topics   Alcohol use: Not Currently    Alcohol/week: 3.0 standard drinks    Types: 3 Shots of liquor per week    Comment: q weekend   Drug use: Not Currently    Types: Cocaine, Marijuana    Comment: last cocaine 12/07/18; last MJ 12/06/18     Review of Systems  Cardiovascular: No chest pain. Respiratory: No SOB. Gastrointestinal: No abdominal pain.  No nausea, no vomiting.  Musculoskeletal: Negative for musculoskeletal pain. Skin: Negative for rash, abrasions, lacerations,  ecchymosis. Neurological: Negative for numbness or tingling. Positive for headache.   ____________________________________________   PHYSICAL EXAM:  VITAL SIGNS: ED Triage Vitals [11/09/19 1118]  Enc Vitals Group     BP (!) 145/84     Pulse Rate (!) 109     Resp 18     Temp 98.1 F (36.7 C)     Temp Source Oral     SpO2 99 %     Weight 158 lb 11.7 oz (72 kg)     Height 5\' 2"  (1.575 m)     Head Circumference      Peak Flow      Pain Score 7     Pain Loc      Pain Edu?      Excl. in GC?      Constitutional: Alert and oriented. Well appearing and in no acute distress. Eyes: Conjunctivae are normal. PERRL. EOMI. Head: Mild swelling to right scalp with very small overlying abrasion. ENT:      Ears:      Nose: No congestion/rhinnorhea.      Mouth/Throat: Mucous membranes are moist.  Neck: No stridor. Cardiovascular: Normal rate, regular rhythm.  Good peripheral circulation. Respiratory: Normal respiratory effort without tachypnea or retractions. Lungs CTAB. Good air entry to the bases with no decreased or absent breath sounds. Gastrointestinal: Soft and nontender to palpation. No guarding or rigidity. No palpable masses. No distention.  Musculoskeletal: Full range of motion to all extremities. No gross deformities appreciated. Neurologic:  Normal speech and language. No gross focal neurologic deficits are appreciated.  Skin:  Skin is warm, dry and intact.  Psychiatric: Mood and affect are normal. Speech and behavior are normal. Patient exhibits appropriate insight and judgement.   ____________________________________________   LABS (all labs ordered are listed, but only abnormal results are displayed)  Labs Reviewed - No data to display ____________________________________________  EKG   ____________________________________________  RADIOLOGY   No results found.  ____________________________________________    PROCEDURES  Procedure(s) performed:     Procedures  LACERATION REPAIR Performed by:  Consent: Verbal consent obtained.  Consent given by: patient  Prepped and Draped in normal sterile fashion  Wound explored: No foreign bodies   Abrasion Location: scalp  Irrigation method: syringe  Amount of cleaning: Enid Derry normal saline  Skin closure: Dermabond  Patient tolerance: Patient tolerated the procedure well with no immediate complications.  Medications  acetaminophen (TYLENOL) tablet 650 mg (650 mg Oral Given 11/09/19 1303)     ____________________________________________   INITIAL IMPRESSION / ASSESSMENT AND PLAN / ED COURSE  Pertinent labs & imaging results that were available during my care of the patient were reviewed by me and considered in my medical decision making (see chart for details).  Review of the [place] CSRS was performed in accordance of the [facility] prior to dispensing any controlled drugs.   Patient presented to emergency department for evaluation of head injury.  Vital  signs and exam are reassuring.  Patient did not lose consciousness and has not had any vomiting or worsening symptoms.  Through shared decision making, elect to proceed with observation and monitoring of symptoms versus CT scan.  Patient has a small abrasion that was repaired with Dermabond.  Fetal heart tones are 150 bpm.  Patient was mildly hypertensive on arrival to the emergency department, likely stress and pain due to trauma.  Blood pressure after recheck was 114/80.  Patient denies any abdominal pain or vaginal bleeding.  Headache improved following Tylenol.  Patient is to follow up with PCP as directed. Patient is given ED precautions to return to the ED for any worsening or new symptoms.   Tina Parrish was evaluated in Emergency Department on 11/09/2019 for the symptoms described in the history of present illness. She was evaluated in the context of the global COVID-19 pandemic, which necessitated consideration  that the patient might be at risk for infection with the SARS-CoV-2 virus that causes COVID-19. Institutional protocols and algorithms that pertain to the evaluation of patients at risk for COVID-19 are in a state of rapid change based on information released by regulatory bodies including the CDC and federal and state organizations. These policies and algorithms were followed during the patient's care in the ED.  ____________________________________________  FINAL CLINICAL IMPRESSION(S) / ED DIAGNOSES  Final diagnoses:  Injury of head, initial encounter      NEW MEDICATIONS STARTED DURING THIS VISIT:  ED Discharge Orders    None          This chart was dictated using voice recognition software/Dragon. Despite best efforts to proofread, errors can occur which can change the meaning. Any change was purely unintentional.    Enid Derry, PA-C 11/09/19 1455    Vicente Males Clent Jacks, MD 11/09/19 [phone]

## 2019-11-09 NOTE — ED Triage Notes (Signed)
Reports trip and fall and then hit the corner of table to right side of head. No LOC. Pt approx [redacted] weeks pregnant she says.

## 2019-11-09 NOTE — ED Notes (Signed)
Fetal heart tones were 150bpm.

## 2019-11-09 NOTE — ED Notes (Signed)
Pt reports being 14-15W pregnant

## 2019-11-09 NOTE — Telephone Encounter (Signed)
Pt. states the Dr. she was referred to doesn't accept Medicaid. Please give her a call.

## 2019-11-12 ENCOUNTER — Telehealth: Payer: Self-pay

## 2019-11-12 NOTE — Telephone Encounter (Signed)
Call from Ellison Carwin MSW, OBCM who has verified that [facility] accepts client's Medicaid coverage. Referral, demographic information and records faxed with confirmation received. Jossie Ng, RN

## 2019-11-13 ENCOUNTER — Telehealth: Payer: Self-pay

## 2019-11-13 DIAGNOSIS — O09899 Supervision of other high risk pregnancies, unspecified trimester: Secondary | ICD-10-CM | POA: Insufficient documentation

## 2019-11-13 NOTE — Telephone Encounter (Signed)
Client now has 11/19/2019 3 pm appt with [facility] for transfer of care due to [facility] and client is aware of appt. Jossie Ng, RN

## 2019-11-13 NOTE — Telephone Encounter (Signed)
Client has been transferred to [facility] due to [facility]. Call to clinic to ascertain if appt scheduled. Per [employer], appt scheduled for 11/19/2019 at 3:00 pm. BeckyLynn states client aware as she contacted her yesterday with appt. Jossie Ng, RN

## 2019-12-27 DIAGNOSIS — F1210 Cannabis abuse, uncomplicated: Secondary | ICD-10-CM | POA: Insufficient documentation

## 2019-12-27 DIAGNOSIS — Z8619 Personal history of other infectious and parasitic diseases: Secondary | ICD-10-CM

## 2019-12-27 DIAGNOSIS — F1911 Other psychoactive substance abuse, in remission: Secondary | ICD-10-CM | POA: Insufficient documentation

## 2019-12-27 DIAGNOSIS — J4520 Mild intermittent asthma, uncomplicated: Secondary | ICD-10-CM | POA: Insufficient documentation

## 2019-12-27 HISTORY — DX: Personal history of other infectious and parasitic diseases: Z86.19

## 2020-02-16 NOTE — L&D Delivery Note (Signed)
Delivery Note  Date of delivery: 05/07/2020 Estimated Date of Delivery: 05/13/20 Patient's last menstrual period was 07/27/2019 (approximate). EGA: [redacted]w[redacted]d  Delivery Note At 3:42 PM a viable female was delivered via Vaginal, Spontaneous (Presentation: Middle Occiput Posterior).  APGAR: 7, 9; weight  Pending.  Placenta status: Spontaneous, Intact.  Cord: 3 vessels with the following complications: Knot.    First Stage: Labor onset: 1302 Augmentation : Cytotec, Pitocin, and AROM Analgesia /Anesthesia intrapartum: Epidural AROM at [address] presented to L&D with CHTN. She was augmented with pitocin. Epidural placed.   Second Stage: Complete dilation at 1520 Onset of pushing at 1520 FHR second stage Cat II Delivery at 1542 on 05/07/2020  She progressed to complete and had a spontaneous vaginal birth of a live female over an intact perineum. The fetal head was delivered in direct OP position with restitution to LOA. No nuchal cord, true knot. Anterior then posterior shoulders delivered spontaneously. Baby placed on mom's abdomen and attended to by peds. Cord clamped and cut when pulseless by friend. Cord blood obtained for newborn labs.  Third Stage: Placenta delivered intact with 3VC at 1547 Placenta disposition: Pathology Uterine tone firm / bleeding min IV pitocin given for hemorrhage prophylaxis  Anesthesia: Epidural Episiotomy: None Lacerations: None Suture Repair: n/a Est. Blood Loss (mL): 50  Complications: none  Mom to postpartum.  Baby to Couplet care / Skin to Skin.  Newborn: Birth Weight: pending  Apgar Scores: 7/9 Feeding planned: Bottle   Cyril Mourning, CNM 05/07/2020 4:01 PM

## 2020-03-04 ENCOUNTER — Observation Stay
Admission: EM | Admit: 2020-03-04 | Discharge: 2020-03-05 | Disposition: A | Discharge status: Home / Self Care | Payer: Medicaid Other | Attending: Obstetrics & Gynecology | Admitting: Obstetrics & Gynecology

## 2020-03-04 ENCOUNTER — Encounter: Payer: Self-pay | Admitting: Obstetrics & Gynecology

## 2020-03-04 ENCOUNTER — Other Ambulatory Visit: Payer: Self-pay

## 2020-03-04 DIAGNOSIS — Z7982 Long term (current) use of aspirin: Secondary | ICD-10-CM | POA: Insufficient documentation

## 2020-03-04 DIAGNOSIS — N200 Calculus of kidney: Secondary | ICD-10-CM

## 2020-03-04 DIAGNOSIS — O26833 Pregnancy related renal disease, third trimester: Secondary | ICD-10-CM | POA: Diagnosis not present

## 2020-03-04 DIAGNOSIS — N202 Calculus of kidney with calculus of ureter: Secondary | ICD-10-CM | POA: Diagnosis not present

## 2020-03-04 DIAGNOSIS — J45909 Unspecified asthma, uncomplicated: Secondary | ICD-10-CM | POA: Insufficient documentation

## 2020-03-04 DIAGNOSIS — O99323 Drug use complicating pregnancy, third trimester: Secondary | ICD-10-CM | POA: Insufficient documentation

## 2020-03-04 DIAGNOSIS — O0990 Supervision of high risk pregnancy, unspecified, unspecified trimester: Secondary | ICD-10-CM

## 2020-03-04 DIAGNOSIS — O99891 Other specified diseases and conditions complicating pregnancy: Secondary | ICD-10-CM

## 2020-03-04 DIAGNOSIS — Z3A31 31 weeks gestation of pregnancy: Secondary | ICD-10-CM | POA: Insufficient documentation

## 2020-03-04 DIAGNOSIS — Z20822 Contact with and (suspected) exposure to covid-19: Secondary | ICD-10-CM | POA: Diagnosis not present

## 2020-03-04 DIAGNOSIS — O26893 Other specified pregnancy related conditions, third trimester: Secondary | ICD-10-CM | POA: Diagnosis present

## 2020-03-04 DIAGNOSIS — F17210 Nicotine dependence, cigarettes, uncomplicated: Secondary | ICD-10-CM | POA: Diagnosis not present

## 2020-03-04 DIAGNOSIS — O133 Gestational [pregnancy-induced] hypertension without significant proteinuria, third trimester: Secondary | ICD-10-CM | POA: Diagnosis not present

## 2020-03-04 DIAGNOSIS — F1910 Other psychoactive substance abuse, uncomplicated: Secondary | ICD-10-CM | POA: Diagnosis not present

## 2020-03-04 LAB — URINALYSIS, ROUTINE W REFLEX MICROSCOPIC
Bilirubin Urine: NEGATIVE
Glucose, UA: NEGATIVE mg/dL
Ketones, ur: NEGATIVE mg/dL
Nitrite: NEGATIVE
Protein, ur: NEGATIVE mg/dL
Specific Gravity, Urine: 1.017 (ref 1.005–1.030)
pH: 8 (ref 5.0–8.0)

## 2020-03-04 LAB — BASIC METABOLIC PANEL
Anion gap: 11 (ref 5–15)
BUN: 9 mg/dL (ref 6–20)
CO2: 22 mmol/L (ref 22–32)
Calcium: 8.7 mg/dL — ABNORMAL LOW (ref 8.9–10.3)
Chloride: 105 mmol/L (ref 98–111)
Creatinine, Ser: 0.65 mg/dL (ref 0.44–1.00)
GFR, Estimated: >60 mL/min (ref >60)
Glucose, Bld: 105 mg/dL — ABNORMAL HIGH (ref 70–99)
Potassium: 3.6 mmol/L (ref 3.5–5.1)
Sodium: 138 mmol/L (ref 135–145)

## 2020-03-04 LAB — CBC
HCT: 32.3 % — ABNORMAL LOW (ref 36.0–46.0)
Hemoglobin: 10.3 g/dL — ABNORMAL LOW (ref 12.0–15.0)
MCH: 26.8 pg (ref 26.0–34.0)
MCHC: 31.9 g/dL (ref 30.0–36.0)
MCV: 83.9 fL (ref 80.0–100.0)
Platelets: 219 10*3/uL (ref 150–400)
RBC: 3.85 MIL/uL — ABNORMAL LOW (ref 3.87–5.11)
RDW: 14 % (ref 11.5–15.5)
WBC: 20.3 10*3/uL — ABNORMAL HIGH (ref 4.0–10.5)
nRBC: 0 % (ref 0.0–0.2)

## 2020-03-04 MED ORDER — FLEET ENEMA 7-19 GM/118ML RE ENEM
1.0000 | ENEMA | Freq: Once | RECTAL | Status: AC
  Administered 2020-03-04: 1 via RECTAL

## 2020-03-04 MED ORDER — SIMETHICONE 80 MG PO CHEW
160.0000 mg | CHEWABLE_TABLET | Freq: Once | ORAL | Status: AC
  Administered 2020-03-04: 160 mg via ORAL
  Filled 2020-03-04: qty 2

## 2020-03-04 NOTE — OB Triage Note (Addendum)
Patient is G4P3  [redacted]w[redacted]d presents with abdominal pain that is constant and started about 1630 today. Patient states that the pain is mainly in the LLQ that stops at the umbilicus and goes around to her back. Patient states that n/v started at the same time and has had no relief. Patient rates pain a 9/10. VSS. Denies LOF and Bleeding. FM+. Pt denies burning during urination. Monitors applied and assessing.

## 2020-03-05 ENCOUNTER — Inpatient Hospital Stay: Payer: Medicaid Other

## 2020-03-05 ENCOUNTER — Encounter: Payer: Self-pay | Admitting: Obstetrics & Gynecology

## 2020-03-05 DIAGNOSIS — N200 Calculus of kidney: Secondary | ICD-10-CM

## 2020-03-05 DIAGNOSIS — O26839 Pregnancy related renal disease, unspecified trimester: Secondary | ICD-10-CM

## 2020-03-05 DIAGNOSIS — R1030 Lower abdominal pain, unspecified: Secondary | ICD-10-CM

## 2020-03-05 DIAGNOSIS — O26833 Pregnancy related renal disease, third trimester: Secondary | ICD-10-CM | POA: Diagnosis not present

## 2020-03-05 LAB — TYPE AND SCREEN
ABO/RH(D): O POS
Antibody Screen: NEGATIVE

## 2020-03-05 LAB — URINE DRUG SCREEN, QUALITATIVE (ARMC ONLY)
Amphetamines, Ur Screen: NOT DETECTED
Barbiturates, Ur Screen: NOT DETECTED
Benzodiazepine, Ur Scrn: NOT DETECTED
Cannabinoid 50 Ng, Ur ~~LOC~~: NOT DETECTED
Cocaine Metabolite,Ur ~~LOC~~: POSITIVE — AB
MDMA (Ecstasy)Ur Screen: NOT DETECTED
Methadone Scn, Ur: NOT DETECTED
Opiate, Ur Screen: NOT DETECTED
Phencyclidine (PCP) Ur S: NOT DETECTED
Tricyclic, Ur Screen: NOT DETECTED

## 2020-03-05 LAB — ABO/RH: ABO/RH(D): O POS

## 2020-03-05 LAB — RESP PANEL BY RT-PCR (FLU A&B, COVID) ARPGX2
Influenza A by PCR: NEGATIVE
Influenza B by PCR: NEGATIVE
SARS Coronavirus 2 by RT PCR: NEGATIVE

## 2020-03-05 MED ORDER — TAMSULOSIN HCL 0.4 MG PO CAPS: 0.4000 mg | ORAL_CAPSULE | Freq: Every day | ORAL | 0 refills | Status: DC

## 2020-03-05 MED ORDER — TAMSULOSIN HCL 0.4 MG PO CAPS
0.4000 mg | ORAL_CAPSULE | Freq: Every day | ORAL | Status: DC
  Administered 2020-03-05: 0.4 mg via ORAL
  Filled 2020-03-05 (×2): qty 1

## 2020-03-05 MED ORDER — ACETAMINOPHEN 325 MG PO TABS: 1000.0000 mg | ORAL_TABLET | Freq: Four times a day (QID) | ORAL | Status: DC

## 2020-03-05 MED ORDER — BUTORPHANOL TARTRATE 1 MG/ML IJ SOLN
1.0000 mg | INTRAMUSCULAR | Status: DC | PRN
  Administered 2020-03-05: 1 mg via INTRAVENOUS
  Filled 2020-03-05: qty 1

## 2020-03-05 MED ORDER — PROMETHAZINE HCL 25 MG PO TABS: ORAL_TABLET | ORAL | 1 refills | Status: DC

## 2020-03-05 MED ORDER — OXYCODONE HCL 5 MG PO TABS
5.0000 mg | ORAL_TABLET | ORAL | Status: DC | PRN
  Administered 2020-03-05: 5 mg via ORAL
  Filled 2020-03-05 (×2): qty 1

## 2020-03-05 MED ORDER — ONDANSETRON HCL 4 MG PO TABS: 4.0000 mg | ORAL_TABLET | Freq: Three times a day (TID) | ORAL | 1 refills | Status: DC | PRN

## 2020-03-05 MED ORDER — ONDANSETRON HCL 4 MG/2ML IJ SOLN
4.0000 mg | Freq: Four times a day (QID) | INTRAMUSCULAR | Status: DC | PRN
  Administered 2020-03-05: 4 mg via INTRAVENOUS
  Filled 2020-03-05: qty 2

## 2020-03-05 MED ORDER — LACTATED RINGERS IV SOLN: INTRAVENOUS | Status: DC

## 2020-03-05 MED ORDER — ACETAMINOPHEN 325 MG PO TABS: 650.0000 mg | ORAL_TABLET | ORAL | Status: DC | PRN

## 2020-03-05 MED ORDER — OXYCODONE HCL 5 MG PO TABS
10.0000 mg | ORAL_TABLET | Freq: Once | ORAL | Status: AC
  Administered 2020-03-05: 10 mg via ORAL
  Filled 2020-03-05: qty 2

## 2020-03-05 MED ORDER — DOCUSATE SODIUM 100 MG PO CAPS: 100.0000 mg | ORAL_CAPSULE | Freq: Every day | ORAL | 0 refills | Status: DC

## 2020-03-05 MED ORDER — OXYCODONE HCL 5 MG PO TABS: 5.0000 mg | ORAL_TABLET | ORAL | 0 refills | Status: AC | PRN

## 2020-03-05 MED ORDER — OXYCODONE HCL 5 MG PO TABS: 5.0000 mg | ORAL_TABLET | ORAL | 0 refills | Status: DC | PRN

## 2020-03-05 MED ORDER — DOCUSATE SODIUM 100 MG PO CAPS
100.0000 mg | ORAL_CAPSULE | Freq: Every day | ORAL | Status: DC
  Administered 2020-03-05: 100 mg via ORAL
  Filled 2020-03-05: qty 1

## 2020-03-05 NOTE — H&P (Addendum)
OB History & Physical   History of Present Illness:  Chief Complaint:   HPI:  Tina Parrish is a 35 y.o. [phone] female at [redacted]w[redacted]d dated by 13wk ultrasound Patient's last menstrual period was 07/27/2019 (exact date). (inconsistent with dates)  She presents to L&D with sudden onset LLQ pain, recent constipation.  Denies fever.  +nausea/ vomiting.    +FM, no CTX, no LOF, no VB  Pregnancy Issues: 1. Chronic HTN 2. History of syphilis - 1:1 titer, exposure and treatment well prior to this pregnacy 3. + fetal drug exposure 4. +GC/CT this pregnancy, treated 5. GERD 6. Asthma - mild intermittent   In triage, patient could not readily recall her prior BM and says she has been constipated.  She was given simethicone and an enema, but pain persisted.  MRI done and 63mm obstructing ureteral stone identified, with subsequent moderate hydroureteral nephrosis.   Maternal Medical History:   Past Medical History:  Diagnosis Date  . Asthma   . Depression    Postpartum  . Hypertension affecting pregnancy    chronic    Past Surgical History:  Procedure Laterality Date  . denies surgical history    . NO PAST SURGERIES      No Known Allergies  Prior to Admission medications   Medication Sig Start Date End Date Taking? Authorizing Provider  albuterol (VENTOLIN HFA) 108 (90 Base) MCG/ACT inhaler Inhale 2 puffs into the lungs every 6 (six) hours as needed for wheezing or shortness of breath. 10/29/19  Yes Staples, Hulda Humphrey, PA-C  aspirin EC 81 MG tablet Take 1 tablet (81 mg total) by mouth daily. Take 1 tablet (81mg  total) by mouth daily from weeks 12-36 of pregnancy. Swallow whole. 10/29/19  Yes Staples, 10/31/19 L, PA-C  fluticasone (FLOVENT HFA) 110 MCG/ACT inhaler Inhale 1 puff into the lungs 2 (two) times daily. 10/29/19  Yes Staples, 10/31/19, PA-C  Prenatal Vit-Fe Fumarate-FA (PRENATAL VITAMIN) 27-0.8 MG TABS Take 1 tablet by mouth daily. 10/11/19  Yes 10/13/19, MD  promethazine  (PHENERGAN) 25 MG tablet Take 1/2 tablet (12.5mg ) or 1 tablet (25mg ) every 4-6 hours as needed for nausea and vomiting. Patient not taking: Reported on 03/04/2020 10/29/19   03/06/2020 L, PA-C  sulfamethoxazole-trimethoprim (BACTRIM DS) 800-160 MG tablet Take 1 tablet by mouth 2 (two) times daily. Patient not taking: Reported on 03/04/2020 11/06/19   03/06/2020, CNM     Prenatal care site: [facility]   Social History: She  reports that she has been smoking cigarettes. She has been smoking about 0.50 packs per day. She has never used smokeless tobacco. She reports previous alcohol use of about 3.0 standard drinks of alcohol per week. She reports previous drug use. Drugs: Cocaine and Marijuana.  Family History: family history includes Asthma in her brother and son; Diabetes in her maternal aunt, maternal grandfather, maternal grandmother, and mother; Hypertension in her brother, maternal grandmother, and mother.   Review of Systems: A full review of systems was performed and negative except as noted in the HPI.     Physical Exam:  Vital Signs: BP 128/71 (BP Location: Right Arm)   Pulse (!) 121   Temp 99.6 F (37.6 C) (Oral)   Resp 18   Ht 5' 1.5" (1.562 m)   LMP 07/27/2019 (Exact Date)   BMI 29.51 kg/m  General: no acute distress.  HEENT: normocephalic, atraumatic Heart: regular rate & rhythm.  No murmurs/rubs/gallops Lungs: clear to auscultation bilaterally, normal respiratory  effort Abdomen: soft, gravid, non-tender;  Pelvic:   External: Normal external female genitalia  Cervix: deferred   Extremities: non-tender, symmetric, mild edema bilaterally.   Neurologic: Alert & oriented x 3.     NST:  Baseline 150 mod + accels no decels  Results for orders placed or performed during the hospital encounter of 03/04/20 (from the past 24 hour(s))  Urinalysis, Routine w reflex microscopic Urine, Clean Catch     Status: Abnormal   Collection Time: 03/04/20  7:32 PM   Result Value Ref Range   Color, Urine YELLOW (A) YELLOW   APPearance CLOUDY (A) CLEAR   Specific Gravity, Urine 1.017 1.005 - 1.030   pH 8.0 5.0 - 8.0   Glucose, UA NEGATIVE NEGATIVE mg/dL   Hgb urine dipstick MODERATE (A) NEGATIVE   Bilirubin Urine NEGATIVE NEGATIVE   Ketones, ur NEGATIVE NEGATIVE mg/dL   Protein, ur NEGATIVE NEGATIVE mg/dL   Nitrite NEGATIVE NEGATIVE   Leukocytes,Ua TRACE (A) NEGATIVE   RBC / HPF 11-20 0 - 5 RBC/hpf   WBC, UA 0-5 0 - 5 WBC/hpf   Bacteria, UA MANY (A) NONE SEEN   Squamous Epithelial / LPF 0-5 0 - 5   Mucus PRESENT    Amorphous Crystal PRESENT   CBC     Status: Abnormal   Collection Time: 03/04/20  7:53 PM  Result Value Ref Range   WBC 20.3 (H) 4.0 - 10.5 K/uL   RBC 3.85 (L) 3.87 - 5.11 MIL/uL   Hemoglobin 10.3 (L) 12.0 - 15.0 g/dL   HCT 51.8 (L) 84.1 - 66.0 %   MCV 83.9 80.0 - 100.0 fL   MCH 26.8 26.0 - 34.0 pg   MCHC 31.9 30.0 - 36.0 g/dL   RDW 63.0 16.0 - 10.9 %   Platelets 219 150 - 400 K/uL   nRBC 0.0 0.0 - 0.2 %  Basic metabolic panel     Status: Abnormal   Collection Time: 03/04/20  7:53 PM  Result Value Ref Range   Sodium 138 135 - 145 mmol/L   Potassium 3.6 3.5 - 5.1 mmol/L   Chloride 105 98 - 111 mmol/L   CO2 22 22 - 32 mmol/L   Glucose, Bld 105 (H) 70 - 99 mg/dL   BUN 9 6 - 20 mg/dL   Creatinine, Ser 3.23 0.44 - 1.00 mg/dL   Calcium 8.7 (L) 8.9 - 10.3 mg/dL   GFR, Estimated >55 >73 mL/min   Anion gap 11 5 - 15  Type and screen [facility]     Status: None   Collection Time: 03/04/20 11:14 PM  Result Value Ref Range   ABO/RH(D) O POS    Antibody Screen NEG    Sample Expiration      03/07/2020,2359 Performed at [facility], [address] [place], [place] [address]   ABO/Rh     Status: None   Collection Time: 03/05/20 12:11 AM  Result Value Ref Range   ABO/RH(D)      O POS Performed at [facility], [address] [place], [place] [address]     Pertinent Results:     MR PELVIS WO CONTRAST  Result Date: 03/05/2020 CLINICAL DATA:  Left flank pain to the level of the umbilicus wrapping around the back posteriorly EXAM: MRI ABDOMEN AND PELVIS WITHOUT CONTRAST TECHNIQUE: Multiplanar multisequence MR imaging of the abdomen and pelvis was performed. No intravenous contrast was administered. COMPARISON:  None. FINDINGS: COMBINED FINDINGS FOR BOTH MR ABDOMEN AND  PELVIS Lower chest: Atelectatic changes in the otherwise unremarkable lung bases. Normal cardiac size. No sizable pericardial effusion. Hepatobiliary: Normal intrinsic liver signal. No concerning focal liver lesions. Smooth liver surface contour. Normal gallbladder and biliary tree without visible filling defects on this non tailored examination of the biliary tree. Pancreas: No mass, inflammatory changes, or other parenchymal abnormality identified., Spleen:  Within normal limits in size and appearance. Adrenals/Urinary Tract: Moderate left hydroureteronephrosis to the level of a 4 mm calculus in the proximal left ureter (13/33, 11/34). Some mild perinephric T2 signal may reflect some reactive edematous changes about the kidney. More mild right urinary tract dilatation without ureteral calculus is likely upon the spectrum of normal maternal hydronephrosis of pregnancy. No concerning renal lesion. Urinary bladder is largely decompressed at the time of exam and therefore poorly evaluated by CT imaging. No gross bladder abnormality is seen. No visible bladder calculi. Stomach/Bowel: No worrisome bowel wall thickening or dilatation. No evidence of obstruction. Cecum displaced to the right upper quadrant. Normal appendix seen extending from the cecal tip. Vascular/Lymphatic: No pathologically enlarged lymph nodes identified. No abdominal aortic aneurysm demonstrated. Reproductive: Gravid uterus. Fetus is in cephalic positioning. Right anterolateral positioning of the placenta without previa. Grossly normal amniotic fluid  index. Normal 3 vessel cord. No acute or concerning adnexal abnormality. Other: No abdominopelvic free fluid. No visible bowel containing hernia. Musculoskeletal: No suspicious bone lesions. Mild scoliotic curvature of the spine noted incidentally. Grossly normal cord signal. IMPRESSION: 1. Moderate left hydroureteronephrosis to the level of a 4 mm calculus in the proximal left ureter. Some mild perinephric T2 signal may reflect some reactive edematous changes about the kidney in the setting of obstruction. 2. More mild right urinary tract dilatation without ureteral calculus is likely upon the spectrum of normal maternal hydronephrosis of pregnancy. 3. Normal appendix. 4. Gravid uterus without acute complication or gross abnormality. Please note this is not a full assessment of the fetal anatomy. If there is clinical concern, dedicated obstetrical ultrasound should be performed. Currently attempting to contact the ordering provider with a critical value result. Addendum will be submitted upon case discussion. Electronically Signed   By: Kreg ShropshirePrice  DeHay M.D.   On: 03/05/2020 02:28   MR ABDOMEN WO CONTRAST  Result Date: 03/05/2020 CLINICAL DATA:  Left flank pain to the level of the umbilicus wrapping around the back posteriorly EXAM: MRI ABDOMEN AND PELVIS WITHOUT CONTRAST TECHNIQUE: Multiplanar multisequence MR imaging of the abdomen and pelvis was performed. No intravenous contrast was administered. COMPARISON:  None. FINDINGS: COMBINED FINDINGS FOR BOTH MR ABDOMEN AND PELVIS Lower chest: Atelectatic changes in the otherwise unremarkable lung bases. Normal cardiac size. No sizable pericardial effusion. Hepatobiliary: Normal intrinsic liver signal. No concerning focal liver lesions. Smooth liver surface contour. Normal gallbladder and biliary tree without visible filling defects on this non tailored examination of the biliary tree. Pancreas: No mass, inflammatory changes, or other parenchymal abnormality  identified., Spleen:  Within normal limits in size and appearance. Adrenals/Urinary Tract: Moderate left hydroureteronephrosis to the level of a 4 mm calculus in the proximal left ureter (13/33, 11/34). Some mild perinephric T2 signal may reflect some reactive edematous changes about the kidney. More mild right urinary tract dilatation without ureteral calculus is likely upon the spectrum of normal maternal hydronephrosis of pregnancy. No concerning renal lesion. Urinary bladder is largely decompressed at the time of exam and therefore poorly evaluated by CT imaging. No gross bladder abnormality is seen. No visible bladder calculi. Stomach/Bowel: No worrisome bowel wall  thickening or dilatation. No evidence of obstruction. Cecum displaced to the right upper quadrant. Normal appendix seen extending from the cecal tip. Vascular/Lymphatic: No pathologically enlarged lymph nodes identified. No abdominal aortic aneurysm demonstrated. Reproductive: Gravid uterus. Fetus is in cephalic positioning. Right anterolateral positioning of the placenta without previa. Grossly normal amniotic fluid index. Normal 3 vessel cord. No acute or concerning adnexal abnormality. Other: No abdominopelvic free fluid. No visible bowel containing hernia. Musculoskeletal: No suspicious bone lesions. Mild scoliotic curvature of the spine noted incidentally. Grossly normal cord signal. IMPRESSION: 1. Moderate left hydroureteronephrosis to the level of a 4 mm calculus in the proximal left ureter. Some mild perinephric T2 signal may reflect some reactive edematous changes about the kidney in the setting of obstruction. 2. More mild right urinary tract dilatation without ureteral calculus is likely upon the spectrum of normal maternal hydronephrosis of pregnancy. 3. Normal appendix. 4. Gravid uterus without acute complication or gross abnormality. Please note this is not a full assessment of the fetal anatomy. If there is clinical concern, dedicated  obstetrical ultrasound should be performed. Currently attempting to contact the ordering provider with a critical value result. Addendum will be submitted upon case discussion. Electronically Signed   By: Kreg Shropshire M.D.   On: 03/05/2020 02:28    Assessment:  Tina Parrish is a 35 y.o. [phone] female at [redacted]w[redacted]d with left ureteral stone.   Plan:   Admit to observation; antepartum Urology consult IVF and flomax NSAIDs contraindicated due to inexact dating and 31+5. Tylenol + oxycodone for pain relief Daily NST; overnight: reactive (outside of anticipated response to stadol)   ----- Ranae Plumber, MD Attending Obstetrician and Gynecologist [facility], Department of OB/GYN [facility]

## 2020-03-05 NOTE — Discharge Summary (Signed)
Patient ID: Tina Parrish MRN: [id number] DOB/AGE: 03/10/1985 34 y.o.  Admit date: 03/04/2020 Discharge date: 03/05/2020  Admission Diagnoses: abdominal pain in pregnancy   Discharge Diagnoses: Nephrolithiasis   Prenatal Care Site: [facility]  Prenatal Procedures: NST  Consults: Urology   Significant Diagnostic Studies:  Results for orders placed or performed during the hospital encounter of 03/04/20 (from the past 168 hour(s))  Urinalysis, Routine w reflex microscopic Urine, Clean Catch   Collection Time: 03/04/20  7:32 PM  Result Value Ref Range   Color, Urine YELLOW (A) YELLOW   APPearance CLOUDY (A) CLEAR   Specific Gravity, Urine 1.017 1.005 - 1.030   pH 8.0 5.0 - 8.0   Glucose, UA NEGATIVE NEGATIVE mg/dL   Hgb urine dipstick MODERATE (A) NEGATIVE   Bilirubin Urine NEGATIVE NEGATIVE   Ketones, ur NEGATIVE NEGATIVE mg/dL   Protein, ur NEGATIVE NEGATIVE mg/dL   Nitrite NEGATIVE NEGATIVE   Leukocytes,Ua TRACE (A) NEGATIVE   RBC / HPF 11-20 0 - 5 RBC/hpf   WBC, UA 0-5 0 - 5 WBC/hpf   Bacteria, UA MANY (A) NONE SEEN   Squamous Epithelial / LPF 0-5 0 - 5   Mucus PRESENT    Amorphous Crystal PRESENT   Urine Drug Screen, Qualitative ([facility] only)   Collection Time: 03/04/20  7:32 PM  Result Value Ref Range   Tricyclic, Ur Screen NONE DETECTED NONE DETECTED   Amphetamines, Ur Screen NONE DETECTED NONE DETECTED   MDMA (Ecstasy)Ur Screen NONE DETECTED NONE DETECTED   Cocaine Metabolite,Ur [place] POSITIVE (A) NONE DETECTED   Opiate, Ur Screen NONE DETECTED NONE DETECTED   Phencyclidine (PCP) Ur S NONE DETECTED NONE DETECTED   Cannabinoid 50 Ng, Ur [place] NONE DETECTED NONE DETECTED   Barbiturates, Ur Screen NONE DETECTED NONE DETECTED   Benzodiazepine, Ur Scrn NONE DETECTED NONE DETECTED   Methadone Scn, Ur NONE DETECTED NONE DETECTED  CBC   Collection Time: 03/04/20  7:53 PM  Result Value Ref Range   WBC 20.3 (H) 4.0 - 10.5 K/uL   RBC 3.85 (L) 3.87 - 5.11 MIL/uL    Hemoglobin 10.3 (L) 12.0 - 15.0 g/dL   HCT 93.8 (L) 10.1 - 75.1 %   MCV 83.9 80.0 - 100.0 fL   MCH 26.8 26.0 - 34.0 pg   MCHC 31.9 30.0 - 36.0 g/dL   RDW 02.5 85.2 - 77.8 %   Platelets 219 150 - 400 K/uL   nRBC 0.0 0.0 - 0.2 %  Basic metabolic panel   Collection Time: 03/04/20  7:53 PM  Result Value Ref Range   Sodium 138 135 - 145 mmol/L   Potassium 3.6 3.5 - 5.1 mmol/L   Chloride 105 98 - 111 mmol/L   CO2 22 22 - 32 mmol/L   Glucose, Bld 105 (H) 70 - 99 mg/dL   BUN 9 6 - 20 mg/dL   Creatinine, Ser 2.42 0.44 - 1.00 mg/dL   Calcium 8.7 (L) 8.9 - 10.3 mg/dL   GFR, Estimated >35 >36 mL/min   Anion gap 11 5 - 15  Type and screen [facility]   Collection Time: 03/04/20 11:14 PM  Result Value Ref Range   ABO/RH(D) O POS    Antibody Screen NEG    Sample Expiration      03/07/2020,2359 Performed at [facility], [address] [place], [place] [address]   ABO/Rh   Collection Time: 03/05/20 12:11 AM  Result Value Ref Range   ABO/RH(D)  O POS Performed at [facility], [address] [place], [place] [address]   Resp Panel by RT-PCR (Flu A&B, Covid) Nasopharyngeal Swab   Collection Time: 03/05/20  3:46 AM   Specimen: Nasopharyngeal Swab; Nasopharyngeal(NP) swabs in vial transport medium  Result Value Ref Range   SARS Coronavirus 2 by RT PCR NEGATIVE NEGATIVE   Influenza A by PCR NEGATIVE NEGATIVE   Influenza B by PCR NEGATIVE NEGATIVE    Treatments: IV hydration  Hospital Course:  This is a 35 y.o. [id number] with IUP at [redacted]w[redacted]d admitted for nephrolithiasis.  She was treated with IV fluids, oral pain medications, and flomax.  Urology was consulted for nephrolithiasis.  High chance of her passing stones on her own.  Will plan to follow up with Urology outpatient next week.  She was deemed stable for discharge to home with outpatient follow up.  Discharge Physical Exam:  BP 122/66 (BP Location: Right Arm)   Pulse (!) 119   Temp 97.9  F (36.6 C) (Oral)   Resp 18   Ht 5' 1.5" (1.562 m)   LMP 07/27/2019 (Exact Date)   SpO2 97%   BMI 29.51 kg/m   General: NAD CV: RRR Pulm: CTABL, nl effort ABD: s/nd/nt, gravid DVT Evaluation: LE non-ttp, no evidence of DVT on exam.  NST reactive this morning, doppler to be done prior to discharge.   Discharge Condition: Stable  Disposition: Discharge disposition: 01-Home or Self Care        Allergies as of 03/05/2020   No Known Allergies     Medication List    STOP taking these medications   sulfamethoxazole-trimethoprim 800-160 MG tablet Commonly known as: BACTRIM DS     TAKE these medications   acetaminophen 325 MG tablet Commonly known as: TYLENOL Take 3 tablets (975 mg total) by mouth every 6 (six) hours.   albuterol 108 (90 Base) MCG/ACT inhaler Commonly known as: VENTOLIN HFA Inhale 2 puffs into the lungs every 6 (six) hours as needed for wheezing or shortness of breath.   aspirin EC 81 MG tablet Take 1 tablet (81 mg total) by mouth daily. Take 1 tablet (81mg  total) by mouth daily from weeks 12-36 of pregnancy. Swallow whole.   docusate sodium 100 MG capsule Commonly known as: COLACE Take 1 capsule (100 mg total) by mouth daily. Start taking on: March 06, 2020   Flovent HFA 110 MCG/ACT inhaler Generic drug: fluticasone Inhale 1 puff into the lungs 2 (two) times daily.   ondansetron 4 MG tablet Commonly known as: Zofran Take 1 tablet (4 mg total) by mouth every 8 (eight) hours as needed for nausea or vomiting.   oxyCODONE 5 MG immediate release tablet Commonly known as: Oxy IR/ROXICODONE Take 1-2 tablets (5-10 mg total) by mouth every 4 (four) hours as needed for up to 5 days for moderate pain.   Prenatal Vitamin 27-0.8 MG Tabs Take 1 tablet by mouth daily.   promethazine 25 MG tablet Commonly known as: PHENERGAN Take 1/2 tablet (12.5mg ) or 1 tablet (25mg ) every 4-6 hours as needed for nausea and vomiting.   tamsulosin 0.4 MG Caps  capsule Commonly known as: FLOMAX Take 1 capsule (0.4 mg total) by mouth daily.       Follow-up Information    [facility]. Schedule an appointment as soon as possible for a visit in 1 week(s).   Why: follow up visit       [facility]. Schedule an appointment as soon as possible for  a visit in 1 week(s).   Why: follow up prenatal visit Contact information: [address] [place] [address] [phone]              Signed:  Gustavo Lah 03/05/2020 1:00 PM ----- Margaretmary Eddy, CNM Certified Nurse Midwife [facility]

## 2020-03-05 NOTE — Progress Notes (Addendum)
ANTEPARTUM PROGRESS NOTE  Tina Parrish is a 35 y.o. [id number] at [redacted]w[redacted]d who is admitted for kidney stones.   Estimated Date of Delivery: 05/02/20  Length of Stay:  0 Days. Admitted 03/04/2020  Subjective: -still having left side and back pain -Managed with PO medications -Able to void, straining urine   She reports:  -active fetal movement -no leakage of fluid -no vaginal bleeding -no contractions  Vitals:  BP 122/66 (BP Location: Right Arm)   Pulse (!) 119   Temp 97.9 F (36.6 C) (Oral)   Resp 18   Ht 5' 1.5" (1.562 m)   LMP 07/27/2019 (Exact Date)   SpO2 97%   BMI 29.51 kg/m  Physical Examination: Physical Exam Constitutional:      Appearance: She is obese.  Cardiovascular:     Rate and Rhythm: Normal rate and regular rhythm.  Pulmonary:     Effort: Pulmonary effort is normal.     Breath sounds: Normal breath sounds.  Abdominal:     General: Bowel sounds are normal.     Palpations: Abdomen is soft.     Tenderness: There is no abdominal tenderness.     Comments: Gravid   Genitourinary:    Vagina: Normal.  Musculoskeletal:     Lumbar back: Tenderness present.  Skin:    General: Skin is warm and dry.  Neurological:     General: No focal deficit present.     Mental Status: She is alert and oriented to person, place, and time.  Psychiatric:        Mood and Affect: Mood normal.    NST appropriate for gestational age this morning, will doppler FHR prior to discharge.   Results for orders placed or performed during the hospital encounter of 03/04/20 (from the past 48 hour(s))  Urinalysis, Routine w reflex microscopic Urine, Clean Catch     Status: Abnormal   Collection Time: 03/04/20  7:32 PM  Result Value Ref Range   Color, Urine YELLOW (A) YELLOW   APPearance CLOUDY (A) CLEAR   Specific Gravity, Urine 1.017 1.005 - 1.030   pH 8.0 5.0 - 8.0   Glucose, UA NEGATIVE NEGATIVE mg/dL   Hgb urine dipstick MODERATE (A) NEGATIVE   Bilirubin Urine NEGATIVE NEGATIVE    Ketones, ur NEGATIVE NEGATIVE mg/dL   Protein, ur NEGATIVE NEGATIVE mg/dL   Nitrite NEGATIVE NEGATIVE   Leukocytes,Ua TRACE (A) NEGATIVE   RBC / HPF 11-20 0 - 5 RBC/hpf   WBC, UA 0-5 0 - 5 WBC/hpf   Bacteria, UA MANY (A) NONE SEEN   Squamous Epithelial / LPF 0-5 0 - 5   Mucus PRESENT    Amorphous Crystal PRESENT     Comment: Performed at [facility], [address] [place], [place] [address]  Urine Drug Screen, Qualitative ([facility] only)     Status: Abnormal   Collection Time: 03/04/20  7:32 PM  Result Value Ref Range   Tricyclic, Ur Screen NONE DETECTED NONE DETECTED   Amphetamines, Ur Screen NONE DETECTED NONE DETECTED   MDMA (Ecstasy)Ur Screen NONE DETECTED NONE DETECTED   Cocaine Metabolite,Ur [place] POSITIVE (A) NONE DETECTED   Opiate, Ur Screen NONE DETECTED NONE DETECTED   Phencyclidine (PCP) Ur S NONE DETECTED NONE DETECTED   Cannabinoid 50 Ng, Ur [place] NONE DETECTED NONE DETECTED   Barbiturates, Ur Screen NONE DETECTED NONE DETECTED   Benzodiazepine, Ur Scrn NONE DETECTED NONE DETECTED   Methadone Scn, Ur NONE DETECTED NONE DETECTED    Comment: (NOTE)  Tricyclics + metabolites, urine    Cutoff 1000 ng/mL Amphetamines + metabolites, urine  Cutoff 1000 ng/mL MDMA (Ecstasy), urine              Cutoff 500 ng/mL Cocaine Metabolite, urine          Cutoff 300 ng/mL Opiate + metabolites, urine        Cutoff 300 ng/mL Phencyclidine (PCP), urine         Cutoff 25 ng/mL Cannabinoid, urine                 Cutoff 50 ng/mL Barbiturates + metabolites, urine  Cutoff 200 ng/mL Benzodiazepine, urine              Cutoff 200 ng/mL Methadone, urine                   Cutoff 300 ng/mL  The urine drug screen provides only a preliminary, unconfirmed analytical test result and should not be used for non-medical purposes. Clinical consideration and professional judgment should be applied to any positive drug screen result due to possible interfering substances. A more specific alternate  chemical method must be used in order to obtain a confirmed analytical result. Gas chromatography / mass spectrometry (GC/MS) is the preferred confirm atory method. Performed at [facility], [address] [place], [place] [address]   CBC     Status: Abnormal   Collection Time: 03/04/20  7:53 PM  Result Value Ref Range   WBC 20.3 (H) 4.0 - 10.5 K/uL   RBC 3.85 (L) 3.87 - 5.11 MIL/uL   Hemoglobin 10.3 (L) 12.0 - 15.0 g/dL   HCT 88.4 (L) 16.6 - 06.3 %   MCV 83.9 80.0 - 100.0 fL   MCH 26.8 26.0 - 34.0 pg   MCHC 31.9 30.0 - 36.0 g/dL   RDW 01.6 01.0 - 93.2 %   Platelets 219 150 - 400 K/uL   nRBC 0.0 0.0 - 0.2 %    Comment: Performed at [facility], [address] [place], [place] [address]  Basic metabolic panel     Status: Abnormal   Collection Time: 03/04/20  7:53 PM  Result Value Ref Range   Sodium 138 135 - 145 mmol/L   Potassium 3.6 3.5 - 5.1 mmol/L   Chloride 105 98 - 111 mmol/L   CO2 22 22 - 32 mmol/L   Glucose, Bld 105 (H) 70 - 99 mg/dL    Comment: Glucose reference range applies only to samples taken after fasting for at least 8 hours.   BUN 9 6 - 20 mg/dL   Creatinine, Ser 2.20 0.44 - 1.00 mg/dL   Calcium 8.7 (L) 8.9 - 10.3 mg/dL   GFR, Estimated >25 >42 mL/min    Comment: (NOTE) Calculated using the CKD-EPI Creatinine Equation (2021)    Anion gap 11 5 - 15    Comment: Performed at [facility], [address] [place], [place] [address]  Type and screen [facility]     Status: None   Collection Time: 03/04/20 11:14 PM  Result Value Ref Range   ABO/RH(D) O POS    Antibody Screen NEG    Sample Expiration      03/07/2020,2359 Performed at [facility], [address], [place], [place] [address]   ABO/Rh     Status: None   Collection Time: 03/05/20 12:11 AM  Result Value Ref Range   ABO/RH(D)      O POS Performed  at [facility], [address] [place], [place] [address]   Resp Panel  by RT-PCR (Flu A&B, Covid) Nasopharyngeal Swab     Status: None   Collection Time: 03/05/20  3:46 AM   Specimen: Nasopharyngeal Swab; Nasopharyngeal(NP) swabs in vial transport medium  Result Value Ref Range   SARS Coronavirus 2 by RT PCR NEGATIVE NEGATIVE    Comment: (NOTE) SARS-CoV-2 target nucleic acids are NOT DETECTED.  The SARS-CoV-2 RNA is generally detectable in upper respiratory specimens during the acute phase of infection. The lowest concentration of SARS-CoV-2 viral copies this assay can detect is 138 copies/mL. A negative result does not preclude SARS-Cov-2 infection and should not be used as the sole basis for treatment or other patient management decisions. A negative result may occur with  improper specimen collection/handling, submission of specimen other than nasopharyngeal swab, presence of viral mutation(s) within the areas targeted by this assay, and inadequate number of viral copies(<138 copies/mL). A negative result must be combined with clinical observations, patient history, and epidemiological information. The expected result is Negative.  Fact Sheet for Patients:  BloggerCourse.comhttps://www.fda.gov/media/152166/download  Fact Sheet for Healthcare Providers:  SeriousBroker.ithttps://www.fda.gov/media/152162/download  This test is no t yet approved or cleared by the [place] FDA and  has been authorized for detection and/or diagnosis of SARS-CoV-2 by FDA under an Emergency Use Authorization (EUA). This EUA will remain  in effect (meaning this test can be used) for the duration of the COVID-19 declaration under Section 564(b)(1) of the Act, 21 U.S.C.section 360bbb-3(b)(1), unless the authorization is terminated  or revoked sooner.       Influenza A by PCR NEGATIVE NEGATIVE   Influenza B by PCR NEGATIVE NEGATIVE    Comment: (NOTE) The Xpert Xpress SARS-CoV-2/FLU/RSV plus assay is intended as an aid in the diagnosis of influenza from Nasopharyngeal swab specimens and should not  be used as a sole basis for treatment. Nasal washings and aspirates are unacceptable for Xpert Xpress SARS-CoV-2/FLU/RSV testing.  Fact Sheet for Patients: BloggerCourse.comhttps://www.fda.gov/media/152166/download  Fact Sheet for Healthcare Providers: SeriousBroker.ithttps://www.fda.gov/media/152162/download  This test is not yet approved or cleared by the [place] FDA and has been authorized for detection and/or diagnosis of SARS-CoV-2 by FDA under an Emergency Use Authorization (EUA). This EUA will remain in effect (meaning this test can be used) for the duration of the COVID-19 declaration under Section 564(b)(1) of the Act, 21 U.S.C. section 360bbb-3(b)(1), unless the authorization is terminated or revoked.  Performed at [facility], [address] [place], [place] [address]     MR PELVIS WO CONTRAST  Result Date: 03/05/2020 CLINICAL DATA:  Left flank pain to the level of the umbilicus wrapping around the back posteriorly EXAM: MRI ABDOMEN AND PELVIS WITHOUT CONTRAST TECHNIQUE: Multiplanar multisequence MR imaging of the abdomen and pelvis was performed. No intravenous contrast was administered. COMPARISON:  None. FINDINGS: COMBINED FINDINGS FOR BOTH MR ABDOMEN AND PELVIS Lower chest: Atelectatic changes in the otherwise unremarkable lung bases. Normal cardiac size. No sizable pericardial effusion. Hepatobiliary: Normal intrinsic liver signal. No concerning focal liver lesions. Smooth liver surface contour. Normal gallbladder and biliary tree without visible filling defects on this non tailored examination of the biliary tree. Pancreas: No mass, inflammatory changes, or other parenchymal abnormality identified., Spleen:  Within normal limits in size and appearance. Adrenals/Urinary Tract: Moderate left hydroureteronephrosis to the level of a 4 mm calculus in the proximal left ureter (13/33, 11/34). Some mild perinephric T2 signal may reflect some reactive edematous changes about the kidney.  More mild  right urinary tract dilatation without ureteral calculus is likely upon the spectrum of normal maternal hydronephrosis of pregnancy. No concerning renal lesion. Urinary bladder is largely decompressed at the time of exam and therefore poorly evaluated by CT imaging. No gross bladder abnormality is seen. No visible bladder calculi. Stomach/Bowel: No worrisome bowel wall thickening or dilatation. No evidence of obstruction. Cecum displaced to the right upper quadrant. Normal appendix seen extending from the cecal tip. Vascular/Lymphatic: No pathologically enlarged lymph nodes identified. No abdominal aortic aneurysm demonstrated. Reproductive: Gravid uterus. Fetus is in cephalic positioning. Right anterolateral positioning of the placenta without previa. Grossly normal amniotic fluid index. Normal 3 vessel cord. No acute or concerning adnexal abnormality. Other: No abdominopelvic free fluid. No visible bowel containing hernia. Musculoskeletal: No suspicious bone lesions. Mild scoliotic curvature of the spine noted incidentally. Grossly normal cord signal. IMPRESSION: 1. Moderate left hydroureteronephrosis to the level of a 4 mm calculus in the proximal left ureter. Some mild perinephric T2 signal may reflect some reactive edematous changes about the kidney in the setting of obstruction. 2. More mild right urinary tract dilatation without ureteral calculus is likely upon the spectrum of normal maternal hydronephrosis of pregnancy. 3. Normal appendix. 4. Gravid uterus without acute complication or gross abnormality. Please note this is not a full assessment of the fetal anatomy. If there is clinical concern, dedicated obstetrical ultrasound should be performed. Currently attempting to contact the ordering provider with a critical value result. Addendum will be submitted upon case discussion. Electronically Signed   By: Kreg Shropshire M.D.   On: 03/05/2020 02:28   MR ABDOMEN WO CONTRAST  Result Date:  03/05/2020 CLINICAL DATA:  Left flank pain to the level of the umbilicus wrapping around the back posteriorly EXAM: MRI ABDOMEN AND PELVIS WITHOUT CONTRAST TECHNIQUE: Multiplanar multisequence MR imaging of the abdomen and pelvis was performed. No intravenous contrast was administered. COMPARISON:  None. FINDINGS: COMBINED FINDINGS FOR BOTH MR ABDOMEN AND PELVIS Lower chest: Atelectatic changes in the otherwise unremarkable lung bases. Normal cardiac size. No sizable pericardial effusion. Hepatobiliary: Normal intrinsic liver signal. No concerning focal liver lesions. Smooth liver surface contour. Normal gallbladder and biliary tree without visible filling defects on this non tailored examination of the biliary tree. Pancreas: No mass, inflammatory changes, or other parenchymal abnormality identified., Spleen:  Within normal limits in size and appearance. Adrenals/Urinary Tract: Moderate left hydroureteronephrosis to the level of a 4 mm calculus in the proximal left ureter (13/33, 11/34). Some mild perinephric T2 signal may reflect some reactive edematous changes about the kidney. More mild right urinary tract dilatation without ureteral calculus is likely upon the spectrum of normal maternal hydronephrosis of pregnancy. No concerning renal lesion. Urinary bladder is largely decompressed at the time of exam and therefore poorly evaluated by CT imaging. No gross bladder abnormality is seen. No visible bladder calculi. Stomach/Bowel: No worrisome bowel wall thickening or dilatation. No evidence of obstruction. Cecum displaced to the right upper quadrant. Normal appendix seen extending from the cecal tip. Vascular/Lymphatic: No pathologically enlarged lymph nodes identified. No abdominal aortic aneurysm demonstrated. Reproductive: Gravid uterus. Fetus is in cephalic positioning. Right anterolateral positioning of the placenta without previa. Grossly normal amniotic fluid index. Normal 3 vessel cord. No acute or  concerning adnexal abnormality. Other: No abdominopelvic free fluid. No visible bowel containing hernia. Musculoskeletal: No suspicious bone lesions. Mild scoliotic curvature of the spine noted incidentally. Grossly normal cord signal. IMPRESSION: 1. Moderate left hydroureteronephrosis to the level of a  4 mm calculus in the proximal left ureter. Some mild perinephric T2 signal may reflect some reactive edematous changes about the kidney in the setting of obstruction. 2. More mild right urinary tract dilatation without ureteral calculus is likely upon the spectrum of normal maternal hydronephrosis of pregnancy. 3. Normal appendix. 4. Gravid uterus without acute complication or gross abnormality. Please note this is not a full assessment of the fetal anatomy. If there is clinical concern, dedicated obstetrical ultrasound should be performed. Currently attempting to contact the ordering provider with a critical value result. Addendum will be submitted upon case discussion. Electronically Signed   By: Kreg ShropshirePrice  DeHay M.D.   On: 03/05/2020 02:28    Current scheduled medications . docusate sodium  100 mg Oral Daily  . tamsulosin  0.4 mg Oral Daily    I have reviewed the patient's current medications.  ASSESSMENT: Patient Active Problem List   Diagnosis Date Noted  . Kidney stone complicating pregnancy 03/05/2020  . Positive urine drug screen 10/29/19 MJ 11/06/2019  . UTI (urinary tract infection) during pregnancy 10/29/19 E.Coli 50-100,000 & Serratia Marcescens 11/06/2019  . Asthma 10/29/2019  . Gastroesophageal reflux disease 10/29/2019  . Gonorrhea and chlamydia (treated 10/2019) 10/29/2019  . Supervision of high risk pregnancy, antepartum 10/29/2019  . Abnormal Pap smear of cervix 12/18/2018  . Former smoker (last use 09/2019) 12/08/2018  . 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) 06/14/2018  . Chronic hypertension 10/14/2014   . Obesity, unspecified 04/12/2014    PLAN: Discussed plan of care with Dr. Elesa MassedWard and Dr. Lonna CobbStoioff.   -Will discharge home to manage kidney stone outpatient -Plan to send home with pain medications, flomax, and supplies to strain urine -Will send rx for antiemetics -Plan to follow up with urology next week -Follow up with OB/GYN for prenatal visit next week.    Margaretmary EddyAnna Dezarai Prew, CNM Certified Nurse Midwife [facility]

## 2020-03-05 NOTE — Discharge Instructions (Signed)

## 2020-03-05 NOTE — Consult Note (Signed)
Urology Consult  Requesting physician:  Chief Complaint: Abdominal pain  History of Present Illness: Tina Parrish is a 35 y.o. female ~[redacted] weeks pregnant presented to [facility] ED at 5 PM yesterday evening with acute onset of left lower quadrant abdominal pain associated with nausea and vomiting.  Pain rated severe without identifiable precipitating, aggravating or alleviating factors.  Denies fever, chills or gross hematuria.  No prior history stone disease.  A noncontrast MRI of the abdomen pelvis was performed which showed a 4 mm left proximal ureteral calculus with moderate hydronephrosis.  Pain has improved with parenteral analgesics.  UA 11-20 RBCs, no WBCs   Past Medical History:  Diagnosis Date  . Asthma   . Depression    Postpartum  . Hypertension affecting pregnancy    chronic    Past Surgical History:  Procedure Laterality Date  . denies surgical history    . NO PAST SURGERIES      Home Medications:  Current Meds  Medication Sig  . albuterol (VENTOLIN HFA) 108 (90 Base) MCG/ACT inhaler Inhale 2 puffs into the lungs every 6 (six) hours as needed for wheezing or shortness of breath.  Marland Kitchen aspirin EC 81 MG tablet Take 1 tablet (81 mg total) by mouth daily. Take 1 tablet (81mg  total) by mouth daily from weeks 12-36 of pregnancy. Swallow whole.  . fluticasone (FLOVENT HFA) 110 MCG/ACT inhaler Inhale 1 puff into the lungs 2 (two) times daily.  . Prenatal Vit-Fe Fumarate-FA (PRENATAL VITAMIN) 27-0.8 MG TABS Take 1 tablet by mouth daily.    Allergies: No Known Allergies  Family History  Problem Relation Age of Onset  . Hypertension Mother   . Diabetes Mother   . Hypertension Brother   . Asthma Brother   . Asthma Son   . Diabetes Maternal Grandmother   . Hypertension Maternal Grandmother   . Diabetes Maternal Grandfather   . Diabetes Maternal Aunt     Social History:  reports that she has been smoking cigarettes. She has been smoking about 0.50 packs per day. She  has never used smokeless tobacco. She reports previous alcohol use of about 3.0 standard drinks of alcohol per week. She reports previous drug use. Drugs: Cocaine and Marijuana.  ROS: A complete review of systems was performed.  All systems are negative except for pertinent findings as noted.  Physical Exam:  Vital signs in last 24 hours: Temp:  [97.9 F (36.6 C)-99.6 F (37.6 C)] 97.9 F (36.6 C) (01/19 0724) Pulse Rate:  [93-121] 119 (01/19 0724) Resp:  [16-18] 18 (01/19 0724) BP: (102-128)/(66-71) 122/66 (01/19 0724) SpO2:  [97 %] 97 % (01/19 0724) Constitutional:  Alert, No acute distress HEENT: [place] AT, moist mucus membranes.  Trachea midline, no masses Cardiovascular: Regular rate and rhythm, no clubbing, cyanosis, or edema. Respiratory: Normal respiratory effort, lungs clear bilaterally   Laboratory Data:  Recent Labs    03/04/20 1953  WBC 20.3*  HGB 10.3*  HCT 32.3*   Recent Labs    03/04/20 1953  NA 138  K 3.6  CL 105  CO2 22  GLUCOSE 105*  BUN 9  CREATININE 0.65  CALCIUM 8.7*   No results for input(s): LABPT, INR in the last 72 hours. No results for input(s): LABURIN in the last 72 hours. Results for orders placed or performed during the hospital encounter of 03/04/20  Resp Panel by RT-PCR (Flu A&B, Covid) Nasopharyngeal Swab     Status: None   Collection Time: 03/05/20  3:46 AM   Specimen: Nasopharyngeal Swab; Nasopharyngeal(NP) swabs in vial transport medium  Result Value Ref Range Status   SARS Coronavirus 2 by RT PCR NEGATIVE NEGATIVE Final    Comment: (NOTE) SARS-CoV-2 target nucleic acids are NOT DETECTED.  The SARS-CoV-2 RNA is generally detectable in upper respiratory specimens during the acute phase of infection. The lowest concentration of SARS-CoV-2 viral copies this assay can detect is 138 copies/mL. A negative result does not preclude SARS-Cov-2 infection and should not be used as the sole basis for treatment or other patient management  decisions. A negative result may occur with  improper specimen collection/handling, submission of specimen other than nasopharyngeal swab, presence of viral mutation(s) within the areas targeted by this assay, and inadequate number of viral copies(<138 copies/mL). A negative result must be combined with clinical observations, patient history, and epidemiological information. The expected result is Negative.  Fact Sheet for Patients:  BloggerCourse.com  Fact Sheet for Healthcare Providers:  SeriousBroker.it  This test is no t yet approved or cleared by the [place] FDA and  has been authorized for detection and/or diagnosis of SARS-CoV-2 by FDA under an Emergency Use Authorization (EUA). This EUA will remain  in effect (meaning this test can be used) for the duration of the COVID-19 declaration under Section 564(b)(1) of the Act, 21 U.S.C.section 360bbb-3(b)(1), unless the authorization is terminated  or revoked sooner.       Influenza A by PCR NEGATIVE NEGATIVE Final   Influenza B by PCR NEGATIVE NEGATIVE Final    Comment: (NOTE) The Xpert Xpress SARS-CoV-2/FLU/RSV plus assay is intended as an aid in the diagnosis of influenza from Nasopharyngeal swab specimens and should not be used as a sole basis for treatment. Nasal washings and aspirates are unacceptable for Xpert Xpress SARS-CoV-2/FLU/RSV testing.  Fact Sheet for Patients: BloggerCourse.com  Fact Sheet for Healthcare Providers: SeriousBroker.it  This test is not yet approved or cleared by the [place] FDA and has been authorized for detection and/or diagnosis of SARS-CoV-2 by FDA under an Emergency Use Authorization (EUA). This EUA will remain in effect (meaning this test can be used) for the duration of the COVID-19 declaration under Section 564(b)(1) of the Act, 21 U.S.C. section 360bbb-3(b)(1), unless the  authorization is terminated or revoked.  Performed at [facility], [address] [place], [place] [address]      Radiologic Imaging: MR PELVIS WO CONTRAST  Result Date: 03/05/2020 CLINICAL DATA:  Left flank pain to the level of the umbilicus wrapping around the back posteriorly EXAM: MRI ABDOMEN AND PELVIS WITHOUT CONTRAST TECHNIQUE: Multiplanar multisequence MR imaging of the abdomen and pelvis was performed. No intravenous contrast was administered. COMPARISON:  None. FINDINGS: COMBINED FINDINGS FOR BOTH MR ABDOMEN AND PELVIS Lower chest: Atelectatic changes in the otherwise unremarkable lung bases. Normal cardiac size. No sizable pericardial effusion. Hepatobiliary: Normal intrinsic liver signal. No concerning focal liver lesions. Smooth liver surface contour. Normal gallbladder and biliary tree without visible filling defects on this non tailored examination of the biliary tree. Pancreas: No mass, inflammatory changes, or other parenchymal abnormality identified., Spleen:  Within normal limits in size and appearance. Adrenals/Urinary Tract: Moderate left hydroureteronephrosis to the level of a 4 mm calculus in the proximal left ureter (13/33, 11/34). Some mild perinephric T2 signal may reflect some reactive edematous changes about the kidney. More mild right urinary tract dilatation without ureteral calculus is likely upon the spectrum of normal maternal hydronephrosis of pregnancy. No concerning renal lesion. Urinary bladder is  largely decompressed at the time of exam and therefore poorly evaluated by CT imaging. No gross bladder abnormality is seen. No visible bladder calculi. Stomach/Bowel: No worrisome bowel wall thickening or dilatation. No evidence of obstruction. Cecum displaced to the right upper quadrant. Normal appendix seen extending from the cecal tip. Vascular/Lymphatic: No pathologically enlarged lymph nodes identified. No abdominal aortic aneurysm demonstrated.  Reproductive: Gravid uterus. Fetus is in cephalic positioning. Right anterolateral positioning of the placenta without previa. Grossly normal amniotic fluid index. Normal 3 vessel cord. No acute or concerning adnexal abnormality. Other: No abdominopelvic free fluid. No visible bowel containing hernia. Musculoskeletal: No suspicious bone lesions. Mild scoliotic curvature of the spine noted incidentally. Grossly normal cord signal. IMPRESSION: 1. Moderate left hydroureteronephrosis to the level of a 4 mm calculus in the proximal left ureter. Some mild perinephric T2 signal may reflect some reactive edematous changes about the kidney in the setting of obstruction. 2. More mild right urinary tract dilatation without ureteral calculus is likely upon the spectrum of normal maternal hydronephrosis of pregnancy. 3. Normal appendix. 4. Gravid uterus without acute complication or gross abnormality. Please note this is not a full assessment of the fetal anatomy. If there is clinical concern, dedicated obstetrical ultrasound should be performed. Currently attempting to contact the ordering provider with a critical value result. Addendum will be submitted upon case discussion. Electronically Signed   By: Kreg Shropshire M.D.   On: 03/05/2020 02:28   MR ABDOMEN WO CONTRAST  Result Date: 03/05/2020 CLINICAL DATA:  Left flank pain to the level of the umbilicus wrapping around the back posteriorly EXAM: MRI ABDOMEN AND PELVIS WITHOUT CONTRAST TECHNIQUE: Multiplanar multisequence MR imaging of the abdomen and pelvis was performed. No intravenous contrast was administered. COMPARISON:  None. FINDINGS: COMBINED FINDINGS FOR BOTH MR ABDOMEN AND PELVIS Lower chest: Atelectatic changes in the otherwise unremarkable lung bases. Normal cardiac size. No sizable pericardial effusion. Hepatobiliary: Normal intrinsic liver signal. No concerning focal liver lesions. Smooth liver surface contour. Normal gallbladder and biliary tree without  visible filling defects on this non tailored examination of the biliary tree. Pancreas: No mass, inflammatory changes, or other parenchymal abnormality identified., Spleen:  Within normal limits in size and appearance. Adrenals/Urinary Tract: Moderate left hydroureteronephrosis to the level of a 4 mm calculus in the proximal left ureter (13/33, 11/34). Some mild perinephric T2 signal may reflect some reactive edematous changes about the kidney. More mild right urinary tract dilatation without ureteral calculus is likely upon the spectrum of normal maternal hydronephrosis of pregnancy. No concerning renal lesion. Urinary bladder is largely decompressed at the time of exam and therefore poorly evaluated by CT imaging. No gross bladder abnormality is seen. No visible bladder calculi. Stomach/Bowel: No worrisome bowel wall thickening or dilatation. No evidence of obstruction. Cecum displaced to the right upper quadrant. Normal appendix seen extending from the cecal tip. Vascular/Lymphatic: No pathologically enlarged lymph nodes identified. No abdominal aortic aneurysm demonstrated. Reproductive: Gravid uterus. Fetus is in cephalic positioning. Right anterolateral positioning of the placenta without previa. Grossly normal amniotic fluid index. Normal 3 vessel cord. No acute or concerning adnexal abnormality. Other: No abdominopelvic free fluid. No visible bowel containing hernia. Musculoskeletal: No suspicious bone lesions. Mild scoliotic curvature of the spine noted incidentally. Grossly normal cord signal. IMPRESSION: 1. Moderate left hydroureteronephrosis to the level of a 4 mm calculus in the proximal left ureter. Some mild perinephric T2 signal may reflect some reactive edematous changes about the kidney in the setting of obstruction. 2.  More mild right urinary tract dilatation without ureteral calculus is likely upon the spectrum of normal maternal hydronephrosis of pregnancy. 3. Normal appendix. 4. Gravid uterus  without acute complication or gross abnormality. Please note this is not a full assessment of the fetal anatomy. If there is clinical concern, dedicated obstetrical ultrasound should be performed. Currently attempting to contact the ordering provider with a critical value result. Addendum will be submitted upon case discussion. Electronically Signed   By: Kreg ShropshirePrice  DeHay M.D.   On: 03/05/2020 02:28    Impression/Recommendation:   1.  Left proximal ureteral calculus with renal colic  4 mm left proximal ureteral calculus  Management options were discussed in detail.  Based on stone size there is a >80% chance it will pass.  If pain can be adequately controlled a brief trial of passage is reasonable.   In her last trimester would not recommend ureteroscopic removal of a proximal calculus due to difficulty accessing the upper ureter secondary to gravid uterus  Ureteral stent placement was discussed which should resolve her colic however we did discuss the possibility of significant stent symptoms.  If she was concerned about stent symptoms percutaneous nephrostomy by IR would also be an option.  She would like to think over these options   If no contraindication to tamsulosin would start 0.4 mg daily   03/05/2020, 7:59 AM  Irineo AxonScott Malika Demario,  MD

## 2020-03-13 ENCOUNTER — Other Ambulatory Visit: Payer: Self-pay

## 2020-03-13 ENCOUNTER — Ambulatory Visit (INDEPENDENT_AMBULATORY_CARE_PROVIDER_SITE_OTHER): Payer: Medicaid Other | Admitting: Physician Assistant

## 2020-03-13 ENCOUNTER — Ambulatory Visit
Admission: RE | Admit: 2020-03-13 | Discharge: 2020-03-13 | Disposition: A | Discharge status: Home / Self Care | Payer: Medicaid Other | Source: Ambulatory Visit | Attending: Physician Assistant | Admitting: Physician Assistant

## 2020-03-13 ENCOUNTER — Encounter: Payer: Self-pay | Admitting: Physician Assistant

## 2020-03-13 VITALS — BP 122/80 | HR 106 | Temp 98.0°F | Ht 65.0 in | Wt 177.0 lb

## 2020-03-13 DIAGNOSIS — R109 Unspecified abdominal pain: Secondary | ICD-10-CM | POA: Diagnosis not present

## 2020-03-13 DIAGNOSIS — O26899 Other specified pregnancy related conditions, unspecified trimester: Secondary | ICD-10-CM | POA: Diagnosis not present

## 2020-03-13 LAB — URINALYSIS, COMPLETE
Bilirubin, UA: NEGATIVE
Glucose, UA: NEGATIVE
Ketones, UA: NEGATIVE
Nitrite, UA: NEGATIVE
Specific Gravity, UA: 1.01 (ref 1.005–1.030)
Urobilinogen, Ur: 1 mg/dL (ref 0.2–1.0)
pH, UA: 6.5 (ref 5.0–7.5)

## 2020-03-13 LAB — MICROSCOPIC EXAMINATION: WBC, UA: >30 /hpf — AB (ref 0–5)

## 2020-03-13 MED ORDER — CEFPODOXIME PROXETIL 200 MG PO TABS: 200.0000 mg | ORAL_TABLET | Freq: Two times a day (BID) | ORAL | 0 refills | Status: DC

## 2020-03-13 MED ORDER — CEFTRIAXONE SODIUM 1 G IJ SOLR
1.0000 g | Freq: Once | INTRAMUSCULAR | Status: AC
Start: 2020-03-13 — End: 2020-03-13
  Administered 2020-03-13: 1 g via INTRAMUSCULAR

## 2020-03-13 NOTE — Progress Notes (Signed)
03/13/2020 2:53 PM   Martie Lee Temple Pacini Aug 09, 1985 [id number]  CC: Chief Complaint  Patient presents with  . Nephrolithiasis   HPI: ANOLA MCGOUGH is a 35 y.o. pregnant female at [redacted]w[redacted]d who was recently hospitalized with a 57mm proximal left ureteral stone and discharged for a trial of passage on Flomax who presents today for outpatient follow-up.  Today she reports ongoing intermittent left flank pain, nausea, and subjective fevers.  She started to have some right flank pain today.  She has not been taking her temperature at home but has been taking Tylenol.  She does not believe she has passed a stone.  In-office UA today positive for 2+ leukocyte esterase, 1+ protein, and 3+ blood; urine microscopy with >30 WBCs/HPF, 11-30 RBCs/HPF, and moderate bacteria.  PMH: Past Medical History:  Diagnosis Date  . Asthma   . Depression    Postpartum  . Hypertension affecting pregnancy    chronic    Surgical History: Past Surgical History:  Procedure Laterality Date  . denies surgical history    . NO PAST SURGERIES      Home Medications:  Allergies as of 03/13/2020   No Known Allergies     Medication List       Accurate as of March 13, 2020  2:53 PM. If you have any questions, ask your nurse or doctor.        acetaminophen 325 MG tablet Commonly known as: TYLENOL Take 3 tablets (975 mg total) by mouth every 6 (six) hours.   albuterol 108 (90 Base) MCG/ACT inhaler Commonly known as: VENTOLIN HFA Inhale 2 puffs into the lungs every 6 (six) hours as needed for wheezing or shortness of breath.   aspirin EC 81 MG tablet Take 1 tablet (81 mg total) by mouth daily. Take 1 tablet (81mg  total) by mouth daily from weeks 12-36 of pregnancy. Swallow whole.   docusate sodium 100 MG capsule Commonly known as: COLACE Take 1 capsule (100 mg total) by mouth daily.   Flovent HFA 110 MCG/ACT inhaler Generic drug: fluticasone Inhale 1 puff into the lungs 2 (two) times daily.    ondansetron 4 MG tablet Commonly known as: Zofran Take 1 tablet (4 mg total) by mouth every 8 (eight) hours as needed for nausea or vomiting.   Prenatal Vitamin 27-0.8 MG Tabs Take 1 tablet by mouth daily.   promethazine 25 MG tablet Commonly known as: PHENERGAN Take 1/2 tablet (12.5mg ) or 1 tablet (25mg ) every 4-6 hours as needed for nausea and vomiting.   tamsulosin 0.4 MG Caps capsule Commonly known as: FLOMAX Take 1 capsule (0.4 mg total) by mouth daily.       Allergies:  No Known Allergies  Family History: Family History  Problem Relation Age of Onset  . Hypertension Mother   . Diabetes Mother   . Hypertension Brother   . Asthma Brother   . Asthma Son   . Diabetes Maternal Grandmother   . Hypertension Maternal Grandmother   . Diabetes Maternal Grandfather   . Diabetes Maternal Aunt     Social History:   reports that she has been smoking cigarettes. She has been smoking about 0.50 packs per day. She has never used smokeless tobacco. She reports previous alcohol use of about 3.0 standard drinks of alcohol per week. She reports previous drug use. Drugs: Cocaine and Marijuana.  Physical Exam: BP 122/80   Pulse (!) 106   Temp 98 F (36.7 C) (Oral)   Ht 5\' 5"  (1.651 m)  Wt 177 lb (80.3 kg)   LMP 07/27/2019 (Exact Date)   BMI 29.45 kg/m   Constitutional:  Alert and oriented, uncomfortable appearing, nontoxic appearing HEENT: [place], AT Cardiovascular: No clubbing, cyanosis, or edema Respiratory: Normal respiratory effort, no increased work of breathing Skin: No rashes, bruises or suspicious lesions Neurologic: Grossly intact, no focal deficits, moving all 4 extremities Psychiatric: Normal mood and affect  Laboratory Data: Results for orders placed or performed in visit on 03/13/20  Microscopic Examination   Urine  Result Value Ref Range   WBC, UA >30 (A) 0 - 5 /hpf   RBC 11-30 (A) 0 - 2 /hpf   Epithelial Cells (non renal) 0-10 0 - 10 /hpf   Bacteria, UA  Moderate (A) None seen/Few  Urinalysis, Complete  Result Value Ref Range   Specific Gravity, UA 1.010 1.005 - 1.030   pH, UA 6.5 5.0 - 7.5   Color, UA Yellow Yellow   Appearance Ur Cloudy (A) Clear   Leukocytes,UA 2+ (A) Negative   Protein,UA 1+ (A) Negative/Trace   Glucose, UA Negative Negative   Ketones, UA Negative Negative   RBC, UA 3+ (A) Negative   Bilirubin, UA Negative Negative   Urobilinogen, Ur 1.0 0.2 - 1.0 mg/dL   Nitrite, UA Negative Negative   Microscopic Examination See below:    Pertinent Imaging: Renal US, 03/13/2020: CLINICAL DATA:  Flank pain in a pregnant patient  EXAM: RENAL / URINARY TRACT ULTRASOUND COMPLETE  COMPARISON:  March 05, 2020  FINDINGS: Right Kidney:  Renal measurements: 11.2 x 5.9 x 5.4 cm = volume: 189 mL. Echogenicity within normal limits. No mass or hydronephrosis visualized.  Left Kidney:  Renal measurements: 12.4 x 7.3 x 7.8 cm = volume: 366 mL. Echogenicity within normal limits. No mass or hydronephrosis visualized.  Bladder:  Appears normal for degree of bladder distention. Bilateral ureteral jets are visualized.  Other:  None.  IMPRESSION: 1. No hydronephrosis. Previously described proximal LEFT ureterolithiasis is not visualized and may have since resolved. 2. Bilateral ureteral jets are visualized.   Electronically Signed   By: Meda Klinefelter MD   On: 03/13/2020 16:10  I personally reviewed the images referenced above and note no hydronephrosis.  Assessment & Plan:   1. Flank pain in pregnant patient No hydronephrosis on stat renal ultrasound today, however UA is grossly infected.  In consultation with Dr. Richardo Hanks, will treat for febrile UTI versus early pyelonephritis.  1 dose ceftriaxone administered in clinic today and will treat with oral Cefpodoxime 200 mg twice daily x10 days per her most recent urine culture results.  Will send new urine culture today.  Counseled patient to follow-up  with OB/GYN tomorrow morning to notify them of her clinic visit today.  Reviewed return precautions including fever greater than 101 F, chills, uncontrollable pain, or uncontrollable nausea or vomiting.  Patient expressed understanding. - Urinalysis, Complete - CULTURE, URINE COMPREHENSIVE - US RENAL; Future - cefTRIAXone (ROCEPHIN) injection 1 g - cefpodoxime (VANTIN) 200 MG tablet; Take 1 tablet (200 mg total) by mouth 2 (two) times daily for 10 days.  Dispense: 20 tablet; Refill: 0   Return if symptoms worsen or fail to improve.  Carman Ching, PA-C  [facility] [address], [address] [place], [place] [address] [phone]

## 2020-03-13 NOTE — Patient Instructions (Signed)
You received one dose of ceftriaxone, an antibiotic, in clinic today.  When you leave clinic, please go to the [facility] and use the coupon I gave you to pick up your prescription of oral antibiotics. Please start these tomorrow morning; you will take them twice daily for 10 days.  Please call your OB/GYN tomorrow morning to follow up with them and make them aware that we saw you today and started you on antibiotics.  If you develop a fever greater than 101F, chills, uncontrollable pain, or uncontrollable nausea or vomiting, call our office immediately or go to the Emergency Department.

## 2020-03-14 ENCOUNTER — Other Ambulatory Visit: Payer: Self-pay | Admitting: Physician Assistant

## 2020-03-14 DIAGNOSIS — R109 Unspecified abdominal pain: Secondary | ICD-10-CM

## 2020-03-14 DIAGNOSIS — O26899 Other specified pregnancy related conditions, unspecified trimester: Secondary | ICD-10-CM

## 2020-03-14 MED ORDER — CEFPODOXIME PROXETIL 200 MG PO TABS: 200.0000 mg | ORAL_TABLET | Freq: Two times a day (BID) | ORAL | 0 refills | Status: AC

## 2020-03-18 LAB — CULTURE, URINE COMPREHENSIVE

## 2020-03-19 ENCOUNTER — Telehealth: Payer: Self-pay

## 2020-03-19 NOTE — Telephone Encounter (Signed)
-----   Message from Carman Ching, [place] sent at 03/18/2020  1:23 PM EST ----- Please call her ASAP and see if she is feeling any better on antibiotics. ----- Message ----- From: Interface, [facility] Lab Results In Sent: 03/13/2020   4:36 PM EST To: Carman Ching, PA-C

## 2020-03-19 NOTE — Telephone Encounter (Signed)
Patient reports she is feeling much better on antibiotics, no symptoms.

## 2020-05-06 ENCOUNTER — Other Ambulatory Visit: Payer: Self-pay

## 2020-05-06 ENCOUNTER — Observation Stay: Payer: Medicaid Other

## 2020-05-06 ENCOUNTER — Encounter: Payer: Self-pay | Admitting: Obstetrics and Gynecology

## 2020-05-06 ENCOUNTER — Inpatient Hospital Stay
Admission: EM | Admit: 2020-05-06 | Discharge: 2020-05-12 | DRG: 805 | Disposition: A | Discharge status: Home / Self Care | Payer: Medicaid Other | Attending: Obstetrics and Gynecology | Admitting: Obstetrics and Gynecology

## 2020-05-06 DIAGNOSIS — N059 Unspecified nephritic syndrome with unspecified morphologic changes: Secondary | ICD-10-CM | POA: Diagnosis present

## 2020-05-06 DIAGNOSIS — O85 Puerperal sepsis: Secondary | ICD-10-CM | POA: Diagnosis not present

## 2020-05-06 DIAGNOSIS — J4520 Mild intermittent asthma, uncomplicated: Secondary | ICD-10-CM | POA: Diagnosis present

## 2020-05-06 DIAGNOSIS — R0600 Dyspnea, unspecified: Secondary | ICD-10-CM

## 2020-05-06 DIAGNOSIS — O99334 Smoking (tobacco) complicating childbirth: Secondary | ICD-10-CM | POA: Diagnosis present

## 2020-05-06 DIAGNOSIS — O26833 Pregnancy related renal disease, third trimester: Secondary | ICD-10-CM | POA: Diagnosis present

## 2020-05-06 DIAGNOSIS — O9081 Anemia of the puerperium: Secondary | ICD-10-CM | POA: Diagnosis not present

## 2020-05-06 DIAGNOSIS — O10919 Unspecified pre-existing hypertension complicating pregnancy, unspecified trimester: Secondary | ICD-10-CM

## 2020-05-06 DIAGNOSIS — Z20822 Contact with and (suspected) exposure to covid-19: Secondary | ICD-10-CM | POA: Diagnosis present

## 2020-05-06 DIAGNOSIS — N390 Urinary tract infection, site not specified: Secondary | ICD-10-CM | POA: Diagnosis not present

## 2020-05-06 DIAGNOSIS — Z23 Encounter for immunization: Secondary | ICD-10-CM

## 2020-05-06 DIAGNOSIS — Z1623 Resistance to quinolones and fluoroquinolones: Secondary | ICD-10-CM | POA: Diagnosis not present

## 2020-05-06 DIAGNOSIS — F17210 Nicotine dependence, cigarettes, uncomplicated: Secondary | ICD-10-CM | POA: Diagnosis present

## 2020-05-06 DIAGNOSIS — O9952 Diseases of the respiratory system complicating childbirth: Secondary | ICD-10-CM | POA: Diagnosis present

## 2020-05-06 DIAGNOSIS — O99214 Obesity complicating childbirth: Secondary | ICD-10-CM | POA: Diagnosis present

## 2020-05-06 DIAGNOSIS — Z3A39 39 weeks gestation of pregnancy: Secondary | ICD-10-CM

## 2020-05-06 DIAGNOSIS — J189 Pneumonia, unspecified organism: Secondary | ICD-10-CM | POA: Diagnosis present

## 2020-05-06 DIAGNOSIS — D62 Acute posthemorrhagic anemia: Secondary | ICD-10-CM | POA: Diagnosis not present

## 2020-05-06 DIAGNOSIS — R7881 Bacteremia: Secondary | ICD-10-CM | POA: Diagnosis not present

## 2020-05-06 DIAGNOSIS — R030 Elevated blood-pressure reading, without diagnosis of hypertension: Secondary | ICD-10-CM | POA: Diagnosis present

## 2020-05-06 DIAGNOSIS — O8620 Urinary tract infection following delivery, unspecified: Secondary | ICD-10-CM | POA: Diagnosis not present

## 2020-05-06 DIAGNOSIS — O0933 Supervision of pregnancy with insufficient antenatal care, third trimester: Secondary | ICD-10-CM

## 2020-05-06 DIAGNOSIS — O1002 Pre-existing essential hypertension complicating childbirth: Principal | ICD-10-CM | POA: Diagnosis present

## 2020-05-06 DIAGNOSIS — R109 Unspecified abdominal pain: Secondary | ICD-10-CM

## 2020-05-06 DIAGNOSIS — O9279 Other disorders of lactation: Secondary | ICD-10-CM | POA: Diagnosis present

## 2020-05-06 DIAGNOSIS — O8604 Sepsis following an obstetrical procedure: Secondary | ICD-10-CM

## 2020-05-06 DIAGNOSIS — O169 Unspecified maternal hypertension, unspecified trimester: Secondary | ICD-10-CM | POA: Diagnosis present

## 2020-05-06 DIAGNOSIS — B9689 Other specified bacterial agents as the cause of diseases classified elsewhere: Secondary | ICD-10-CM | POA: Diagnosis not present

## 2020-05-06 LAB — CBC
HCT: 29.1 % — ABNORMAL LOW (ref 36.0–46.0)
Hemoglobin: 9 g/dL — ABNORMAL LOW (ref 12.0–15.0)
MCH: 25.6 pg — ABNORMAL LOW (ref 26.0–34.0)
MCHC: 30.9 g/dL (ref 30.0–36.0)
MCV: 82.9 fL (ref 80.0–100.0)
Platelets: 197 10*3/uL (ref 150–400)
RBC: 3.51 MIL/uL — ABNORMAL LOW (ref 3.87–5.11)
RDW: 15.6 % — ABNORMAL HIGH (ref 11.5–15.5)
WBC: 12.1 10*3/uL — ABNORMAL HIGH (ref 4.0–10.5)
nRBC: 0 % (ref 0.0–0.2)

## 2020-05-06 LAB — URINE DRUG SCREEN, QUALITATIVE (ARMC ONLY)
Amphetamines, Ur Screen: NOT DETECTED
Barbiturates, Ur Screen: NOT DETECTED
Benzodiazepine, Ur Scrn: NOT DETECTED
Cannabinoid 50 Ng, Ur ~~LOC~~: NOT DETECTED
Cocaine Metabolite,Ur ~~LOC~~: POSITIVE — AB
MDMA (Ecstasy)Ur Screen: NOT DETECTED
Methadone Scn, Ur: NOT DETECTED
Opiate, Ur Screen: NOT DETECTED
Phencyclidine (PCP) Ur S: NOT DETECTED
Tricyclic, Ur Screen: NOT DETECTED

## 2020-05-06 LAB — COMPREHENSIVE METABOLIC PANEL
ALT: 13 U/L (ref 0–44)
AST: 26 U/L (ref 15–41)
Albumin: 3.2 g/dL — ABNORMAL LOW (ref 3.5–5.0)
Alkaline Phosphatase: 106 U/L (ref 38–126)
Anion gap: 8 (ref 5–15)
BUN: 6 mg/dL (ref 6–20)
CO2: 20 mmol/L — ABNORMAL LOW (ref 22–32)
Calcium: 8.8 mg/dL — ABNORMAL LOW (ref 8.9–10.3)
Chloride: 108 mmol/L (ref 98–111)
Creatinine, Ser: 0.56 mg/dL (ref 0.44–1.00)
GFR, Estimated: >60 mL/min (ref >60)
Glucose, Bld: 108 mg/dL — ABNORMAL HIGH (ref 70–99)
Potassium: 3.5 mmol/L (ref 3.5–5.1)
Sodium: 136 mmol/L (ref 135–145)
Total Bilirubin: 0.5 mg/dL (ref 0.3–1.2)
Total Protein: 6.7 g/dL (ref 6.5–8.1)

## 2020-05-06 LAB — TYPE AND SCREEN
ABO/RH(D): O POS
Antibody Screen: NEGATIVE

## 2020-05-06 LAB — HEMOGLOBIN A1C
Hgb A1c MFr Bld: 5.3 % (ref 4.8–5.6)
Mean Plasma Glucose: 105.41 mg/dL

## 2020-05-06 LAB — GROUP B STREP BY PCR: Group B strep by PCR: NEGATIVE

## 2020-05-06 LAB — CHLAMYDIA/NGC RT PCR (ARMC ONLY)
Chlamydia Tr: NOT DETECTED
N gonorrhoeae: NOT DETECTED

## 2020-05-06 LAB — PROTEIN / CREATININE RATIO, URINE
Creatinine, Urine: 118 mg/dL
Protein Creatinine Ratio: 0.13 mg/mg{Cre} (ref 0.00–0.15)
Total Protein, Urine: 15 mg/dL

## 2020-05-06 LAB — RESP PANEL BY RT-PCR (FLU A&B, COVID) ARPGX2
Influenza A by PCR: NEGATIVE
Influenza B by PCR: NEGATIVE
SARS Coronavirus 2 by RT PCR: NEGATIVE

## 2020-05-06 LAB — RAPID HIV SCREEN (HIV 1/2 AB+AG)
HIV 1/2 Antibodies: NONREACTIVE
HIV-1 P24 Antigen - HIV24: NONREACTIVE

## 2020-05-06 LAB — DIFFERENTIAL
Abs Immature Granulocytes: 0.06 10*3/uL (ref 0.00–0.07)
Basophils Absolute: 0 10*3/uL (ref 0.0–0.1)
Basophils Relative: 0 %
Eosinophils Absolute: 0.1 10*3/uL (ref 0.0–0.5)
Eosinophils Relative: 1 %
Immature Granulocytes: 1 %
Lymphocytes Relative: 19 %
Lymphs Abs: 2.3 10*3/uL (ref 0.7–4.0)
Monocytes Absolute: 0.9 10*3/uL (ref 0.1–1.0)
Monocytes Relative: 7 %
Neutro Abs: 8.8 10*3/uL — ABNORMAL HIGH (ref 1.7–7.7)
Neutrophils Relative %: 72 %

## 2020-05-06 MED ORDER — LACTATED RINGERS IV SOLN: INTRAVENOUS | Status: DC

## 2020-05-06 MED ORDER — MISOPROSTOL 200 MCG PO TABS
ORAL_TABLET | ORAL | Status: AC
  Filled 2020-05-06: qty 4

## 2020-05-06 MED ORDER — OXYTOCIN 10 UNIT/ML IJ SOLN
INTRAMUSCULAR | Status: AC
  Filled 2020-05-06: qty 2

## 2020-05-06 MED ORDER — BUTORPHANOL TARTRATE 1 MG/ML IJ SOLN: 1.0000 mg | INTRAMUSCULAR | Status: DC | PRN

## 2020-05-06 MED ORDER — ACETAMINOPHEN 500 MG PO TABS
1000.0000 mg | ORAL_TABLET | Freq: Four times a day (QID) | ORAL | Status: DC | PRN
Start: 2020-05-06 — End: 2020-05-07

## 2020-05-06 MED ORDER — OXYTOCIN-SODIUM CHLORIDE 30-0.9 UT/500ML-% IV SOLN: 1.0000 m[IU]/min | INTRAVENOUS | Status: DC

## 2020-05-06 MED ORDER — LIDOCAINE HCL (PF) 1 % IJ SOLN: 30.0000 mL | INTRAMUSCULAR | Status: DC | PRN

## 2020-05-06 MED ORDER — ONDANSETRON HCL 4 MG/2ML IJ SOLN: 4.0000 mg | Freq: Four times a day (QID) | INTRAMUSCULAR | Status: DC | PRN

## 2020-05-06 MED ORDER — LABETALOL HCL 5 MG/ML IV SOLN
20.0000 mg | INTRAVENOUS | Status: DC | PRN
  Administered 2020-05-06 – 2020-05-07 (×4): 20 mg via INTRAVENOUS
  Filled 2020-05-06 (×4): qty 4

## 2020-05-06 MED ORDER — OXYTOCIN-SODIUM CHLORIDE 30-0.9 UT/500ML-% IV SOLN
1.0000 m[IU]/min | INTRAVENOUS | Status: DC
  Administered 2020-05-07: 1 m[IU]/min via INTRAVENOUS

## 2020-05-06 MED ORDER — OXYTOCIN-SODIUM CHLORIDE 30-0.9 UT/500ML-% IV SOLN
2.5000 [IU]/h | INTRAVENOUS | Status: DC
  Filled 2020-05-06: qty 500

## 2020-05-06 MED ORDER — MISOPROSTOL 25 MCG QUARTER TABLET
25.0000 ug | ORAL_TABLET | Freq: Once | ORAL | Status: AC
  Administered 2020-05-06: 25 ug via VAGINAL
  Filled 2020-05-06: qty 1

## 2020-05-06 MED ORDER — AMMONIA AROMATIC IN INHA
RESPIRATORY_TRACT | Status: AC
  Filled 2020-05-06: qty 10

## 2020-05-06 MED ORDER — SOD CITRATE-CITRIC ACID 500-334 MG/5ML PO SOLN: 30.0000 mL | ORAL | Status: DC | PRN

## 2020-05-06 MED ORDER — TERBUTALINE SULFATE 1 MG/ML IJ SOLN: 0.2500 mg | Freq: Once | INTRAMUSCULAR | Status: DC | PRN

## 2020-05-06 MED ORDER — ACETAMINOPHEN 325 MG PO TABS: 650.0000 mg | ORAL_TABLET | ORAL | Status: DC | PRN

## 2020-05-06 MED ORDER — LACTATED RINGERS IV SOLN: 500.0000 mL | INTRAVENOUS | Status: DC | PRN

## 2020-05-06 MED ORDER — LIDOCAINE HCL (PF) 1 % IJ SOLN
INTRAMUSCULAR | Status: AC
  Filled 2020-05-06: qty 30

## 2020-05-06 MED ORDER — CALCIUM CARBONATE ANTACID 500 MG PO CHEW: 2.0000 | CHEWABLE_TABLET | ORAL | Status: DC | PRN

## 2020-05-06 MED ORDER — OXYTOCIN BOLUS FROM INFUSION
333.0000 mL | Freq: Once | INTRAVENOUS | Status: AC
  Administered 2020-05-07: 333 mL via INTRAVENOUS

## 2020-05-06 NOTE — OB Triage Note (Signed)
Pt presented to L/D triage from office for workup. Pt reports no maternal or fetal concerns. Pt reports no current concerns. Pt reports positive fetal movement and no LOF or bleeding at this time. Monitors applied and assessing-FHT 160. BP 147/86. Pt reports no headache, blurry vision, or epigastric pain. +1 reflexes, no clonus.

## 2020-05-06 NOTE — H&P (Signed)
OB History & Physical   History of Present Illness:  Chief Complaint:   HPI:  Tina Parrish is a 35 y.o. [phone] female at [redacted]w[redacted]d dated by LMP of 07/30/2019, not c/w early [place] at [redacted]w[redacted]d.  Sent to L&D for evaluation for elevated blood pressures in clinic today, lapse in prenatal care from 23 to 39 weeks, chronic HTN in pregnancy .    She reports:  -active fetal movement -no leakage of fluid -no vaginal bleeding -no contractions  Pregnancy Issues: 1. Chronic HTN 2. Inadequate prenatal care - lapse in care from 23 to 39 weeks 3. Obesity in pregnancy - BMI35 4. History of syphilis  5. Positive gonorrhea/chlamysdia in pregnancy  6. Positive UDS - 01/18/2020 cocaine and THC  7. Asthma  8. Kidney stones in pregnancy     Maternal Medical History:   Past Medical History:  Diagnosis Date  . Asthma   . Depression    Postpartum  . History of chlamydia 12/27/2019  . History of gonorrhea 12/27/2019  . Hypertension affecting pregnancy    chronic    Past Surgical History:  Procedure Laterality Date  . denies surgical history    . NO PAST SURGERIES      No Known Allergies  Prior to Admission medications   Medication Sig Start Date End Date Taking? Authorizing Provider  acetaminophen (TYLENOL) 325 MG tablet Take 3 tablets (975 mg total) by mouth every 6 (six) hours. 03/05/20  Yes Gustavo Lah, CNM  albuterol (VENTOLIN HFA) 108 (90 Base) MCG/ACT inhaler Inhale 2 puffs into the lungs every 6 (six) hours as needed for wheezing or shortness of breath. 10/29/19  Yes Staples, Eileen Stanford L, PA-C  docusate sodium (COLACE) 100 MG capsule Take 1 capsule (100 mg total) by mouth daily. 03/06/20  Yes Gustavo Lah, CNM  ondansetron (ZOFRAN) 4 MG tablet Take 1 tablet (4 mg total) by mouth every 8 (eight) hours as needed for nausea or vomiting. 03/05/20 03/05/21 Yes Gustavo Lah, CNM  oxyCODONE (OXY IR/ROXICODONE) 5 MG immediate release tablet Take 10 mg by mouth every 4 (four) hours as needed for  severe pain.   Yes [provider]  Prenatal Vit-Fe Fumarate-FA (PRENATAL VITAMIN) 27-0.8 MG TABS Take 1 tablet by mouth daily. 10/11/19  Yes Federico Flake, MD  aspirin EC 81 MG tablet Take 1 tablet (81 mg total) by mouth daily. Take 1 tablet (81mg  total) by mouth daily from weeks 12-36 of pregnancy. Swallow whole. Patient not taking: Reported on 05/06/2020 10/29/19   10/31/19 L, PA-C  fluticasone (FLOVENT HFA) 110 MCG/ACT inhaler Inhale 1 puff into the lungs 2 (two) times daily. Patient not taking: Reported on 05/06/2020 10/29/19   10/31/19, Cherlynn Polo, PA-C  promethazine (PHENERGAN) 25 MG tablet Take 1/2 tablet (12.5mg ) or 1 tablet (25mg ) every 4-6 hours as needed for nausea and vomiting. Patient not taking: Reported on 05/06/2020 03/05/20   05/08/2020, CNM  tamsulosin (FLOMAX) 0.4 MG CAPS capsule Take 1 capsule (0.4 mg total) by mouth daily. Patient not taking: Reported on 05/06/2020 03/05/20   05/08/2020, CNM     Prenatal care site: [facility]   Social History: She  reports that she has been smoking cigarettes. She has been smoking about 0.50 packs per day. She has never used smokeless tobacco. She reports previous alcohol use of about 3.0 standard drinks of alcohol per week. She reports previous drug use. Drugs: Cocaine and Marijuana.  Family History: family history includes  Asthma in her brother and son; Diabetes in her maternal aunt, maternal grandfather, maternal grandmother, and mother; Hypertension in her brother, maternal grandmother, and mother.   Review of Systems: A full review of systems was performed and negative except as noted in the HPI.    Physical Exam:  Vital Signs: BP (!) 146/86 (BP Location: Right Arm)   Pulse 94   Temp 98.6 F (37 C) (Oral)   Resp 18   Ht 5' 1.5" (1.562 m)   Wt 85.3 kg   LMP 07/27/2019 (Approximate)   BMI 34.95 kg/m   General:   alert and cooperative  Skin:  normal  Neurologic:    Alert & oriented x 3  Lungs:    nl effort  Heart:   regular rate and rhythm  Abdomen:  soft, non-tender; bowel sounds normal; no masses,  no organomegaly  Extremities: : non-tender, symmetric.  DTRs: +1     Results for orders placed or performed during the hospital encounter of 05/06/20 (from the past 24 hour(s))  Urine Drug Screen, Qualitative ([facility] only)     Status: Abnormal   Collection Time: 05/06/20  1:06 PM  Result Value Ref Range   Tricyclic, Ur Screen NONE DETECTED NONE DETECTED   Amphetamines, Ur Screen NONE DETECTED NONE DETECTED   MDMA (Ecstasy)Ur Screen NONE DETECTED NONE DETECTED   Cocaine Metabolite,Ur [place] POSITIVE (A) NONE DETECTED   Opiate, Ur Screen NONE DETECTED NONE DETECTED   Phencyclidine (PCP) Ur S NONE DETECTED NONE DETECTED   Cannabinoid 50 Ng, Ur [place] NONE DETECTED NONE DETECTED   Barbiturates, Ur Screen NONE DETECTED NONE DETECTED   Benzodiazepine, Ur Scrn NONE DETECTED NONE DETECTED   Methadone Scn, Ur NONE DETECTED NONE DETECTED  Protein / creatinine ratio, urine     Status: None   Collection Time: 05/06/20  1:06 PM  Result Value Ref Range   Creatinine, Urine 118 mg/dL   Total Protein, Urine 15 mg/dL   Protein Creatinine Ratio 0.13 0.00 - 0.15 mg/mg[Cre]  Chlamydia/NGC rt PCR ([facility] only)     Status: None   Collection Time: 05/06/20  1:06 PM  Result Value Ref Range   Specimen source GC/Chlam ENDOCERVICAL    Chlamydia Tr NOT DETECTED NOT DETECTED   N gonorrhoeae NOT DETECTED NOT DETECTED  Group B strep by PCR     Status: None   Collection Time: 05/06/20  1:06 PM   Specimen: Genital  Result Value Ref Range   Group B strep by PCR NEGATIVE NEGATIVE  CBC     Status: Abnormal   Collection Time: 05/06/20  1:09 PM  Result Value Ref Range   WBC 12.1 (H) 4.0 - 10.5 K/uL   RBC 3.51 (L) 3.87 - 5.11 MIL/uL   Hemoglobin 9.0 (L) 12.0 - 15.0 g/dL   HCT 16.129.1 (L) 09.636.0 - 04.546.0 %   MCV 82.9 80.0 - 100.0 fL   MCH 25.6 (L) 26.0 - 34.0 pg   MCHC 30.9 30.0 - 36.0 g/dL   RDW 40.915.6 (H) 81.111.5 - 91.415.5 %    Platelets 197 150 - 400 K/uL   nRBC 0.0 0.0 - 0.2 %  Differential     Status: Abnormal   Collection Time: 05/06/20  1:09 PM  Result Value Ref Range   Neutrophils Relative % 72 %   Neutro Abs 8.8 (H) 1.7 - 7.7 K/uL   Lymphocytes Relative 19 %   Lymphs Abs 2.3 0.7 - 4.0 K/uL   Monocytes Relative 7 %   Monocytes  Absolute 0.9 0.1 - 1.0 K/uL   Eosinophils Relative 1 %   Eosinophils Absolute 0.1 0.0 - 0.5 K/uL   Basophils Relative 0 %   Basophils Absolute 0.0 0.0 - 0.1 K/uL   Immature Granulocytes 1 %   Abs Immature Granulocytes 0.06 0.00 - 0.07 K/uL  Comprehensive metabolic panel     Status: Abnormal   Collection Time: 05/06/20  1:09 PM  Result Value Ref Range   Sodium 136 135 - 145 mmol/L   Potassium 3.5 3.5 - 5.1 mmol/L   Chloride 108 98 - 111 mmol/L   CO2 20 (L) 22 - 32 mmol/L   Glucose, Bld 108 (H) 70 - 99 mg/dL   BUN 6 6 - 20 mg/dL   Creatinine, Ser 9.21 0.44 - 1.00 mg/dL   Calcium 8.8 (L) 8.9 - 10.3 mg/dL   Total Protein 6.7 6.5 - 8.1 g/dL   Albumin 3.2 (L) 3.5 - 5.0 g/dL   AST 26 15 - 41 U/L   ALT 13 0 - 44 U/L   Alkaline Phosphatase 106 38 - 126 U/L   Total Bilirubin 0.5 0.3 - 1.2 mg/dL   GFR, Estimated >19 >41 mL/min   Anion gap 8 5 - 15  Hemoglobin A1c     Status: None   Collection Time: 05/06/20  1:09 PM  Result Value Ref Range   Hgb A1c MFr Bld 5.3 4.8 - 5.6 %   Mean Plasma Glucose 105.41 mg/dL  Rapid HIV screen (HIV 1/2 Ab+Ag)     Status: None   Collection Time: 05/06/20  1:09 PM  Result Value Ref Range   HIV-1 P24 Antigen - HIV24 NON REACTIVE NON REACTIVE   HIV 1/2 Antibodies NON REACTIVE NON REACTIVE   Interpretation (HIV Ag Ab)      A non reactive test result means that HIV 1 or HIV 2 antibodies and HIV 1 p24 antigen were not detected in the specimen.  Type and screen     Status: None   Collection Time: 05/06/20  1:09 PM  Result Value Ref Range   ABO/RH(D) O POS    Antibody Screen NEG    Sample Expiration      05/09/2020,2359 Performed at [facility], [address] [place], [place] [address]     Pertinent Results:  Prenatal Labs: Blood type/Rh O pos  Antibody screen neg  Rubella Unknown - collected 05/06/20  Varicella Unknown - collected 05/06/20  RPR Reactive 10/29/2019   HBsAg Neg  HIV NR  GC Positive 10/29/2019  Chlamydia Positive 10/29/2019  Genetic screening Not done   1 hour GTT Not done   3 hour GTT Not done   GBS Collected 05/06/20 - Neg    FHT: FHR: 140 bpm, variability: moderate,  accelerations:  Present,  decelerations:  Absent Category/reactivity:  Category I TOCO: occasional  SVE:   /   /       Assessment:  DAYRA RAPLEY is a 35 y.o. [id number] female at 106w0d with elevated BP and limited PNC.   Plan:  1. Admit to Labor & Delivery; consents reviewed and obtained  2. Fetal Well being  - Fetal Tracing: Cat I - GBS neg  3. Routine OB: - Prenatal labs reviewed, as above - Rh pos - CBC & T&S on admit - Clear fluids, IVF  4. Blood Pressure - Labs  PCR 130  AST/ALT WNL  PLT WNL - Blood pressures since admission  136/96 - 149/99  6. Post Partum Planning: -  Tdap: not done d/t lapse in prenatal care  - Flu: declined  - Contraception: TBD - Feeding preference: formula   Thaily Hackworth, CNM 05/06/2020 1:26 PM

## 2020-05-06 NOTE — Progress Notes (Signed)
39+0 weeks G4P3  with elevated BP today . H/o CHTN  . Today BP ranges mild , none in severe . No proteinuria  + cocaine today  on all prior recent UDS .  No abd pain No vaginal bleeding   BP 151/92 CX : 1 cm / 40%/ -3 VTX  EFM : 150 + accels , non Decels  Reassuring  A: CHTN  Cocaine use  Admit start induction - cytotec 25 mcg and then switch to Pitocin  Labetalol to keep BP below 140/90

## 2020-05-06 NOTE — Progress Notes (Signed)
RN discussed with CNM POC.CNM updated on pt status, blood pressures, and lab results. CNM stated that MD would round on patient in evening to discuss POC.

## 2020-05-06 NOTE — Progress Notes (Signed)
[id number] with at [redacted]w[redacted]d, LMP of 07/30/2019, not c/w early [place] at [redacted]w[redacted]d.  Sent to L&D for evaluation for elevated blood pressures in clinic today, lapse in prenatal care from 23 to 39 weeks, chronic HTN in pregnancy   Prenatal provider: [facility] Pregnancy complicated by: 1. Chronic HTN 2. Inadequate prenatal care - lapse in care from 23 to 39 weeks 3. Obesity in pregnancy - BMI35 4. History of syphilis  5. Positive gonorrhea/chlamysdia in pregnancy  6. Positive UDS - 01/18/2020 cocaine and THC  7. Asthma  8. Kidney stones in pregnancy   Patient Active Problem List   Diagnosis Date Noted  . Kidney stone complicating pregnancy 03/05/2020  . History of substance abuse (HCC) 12/27/2019  . Marijuana abuse 12/27/2019  . Mild intermittent asthma without complication 12/27/2019  . Supervision of other high risk pregnancy, antepartum 11/13/2019  . Positive urine drug screen 10/29/19 MJ 11/06/2019  . UTI (urinary tract infection) during pregnancy 10/29/19 E.Coli 50-100,000 & Serratia Marcescens 11/06/2019  . Asthma 10/29/2019  . GERD (gastroesophageal reflux disease) 10/29/2019  . Gonorrhea and chlamydia (treated 10/2019) 10/29/2019  . Supervision of high risk pregnancy, antepartum 10/29/2019  . Abnormal Pap smear of cervix 12/18/2018  . Former smoker (last use 09/2019) 12/08/2018  . 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) 06/14/2018  . Chronic hypertension 10/14/2014  . BMI 31.0-31.9,adult 04/12/2014    Prenatal Labs: Blood type/Rh O pos  Antibody screen neg  Rubella Unknown - collected 05/06/20  Varicella Unknown - collected 05/06/20  RPR Reactive 10/29/2019   HBsAg Neg  HIV NR  GC Positive 10/29/2019  Chlamydia Positive 10/29/2019  Genetic screening Not done   1 hour GTT Not done   3 hour GTT Not done   GBS Unknown - collected 05/06/20   Tdap: not done d/t lapse in prenatal care  Flu: declined  Contraception: TBD Feeding preference:  formula   ____ Margaretmary Eddy, CNM Certified Nurse Midwife [facility]

## 2020-05-06 NOTE — Progress Notes (Signed)
Pt toco and [place] removed. Pt transported to [place] via wheelchair.

## 2020-05-07 ENCOUNTER — Inpatient Hospital Stay: Payer: Medicaid Other | Admitting: Registered Nurse

## 2020-05-07 ENCOUNTER — Encounter: Payer: Self-pay | Admitting: Obstetrics and Gynecology

## 2020-05-07 LAB — VARICELLA ZOSTER ANTIBODY, IGG: Varicella IgG: 1542 index (ref >165)

## 2020-05-07 LAB — HEPATITIS B SURFACE ANTIGEN: Hepatitis B Surface Ag: NONREACTIVE

## 2020-05-07 LAB — RPR
RPR Ser Ql: REACTIVE — AB
RPR Titer: 1:1 {titer}

## 2020-05-07 LAB — RUBELLA SCREEN: Rubella: 1.73 index (ref >0.99)

## 2020-05-07 MED ORDER — FENTANYL 2.5 MCG/ML W/ROPIVACAINE 0.15% IN NS 100 ML EPIDURAL (ARMC): 12.0000 mL/h | EPIDURAL | Status: DC

## 2020-05-07 MED ORDER — DIPHENHYDRAMINE HCL 50 MG/ML IJ SOLN
12.5000 mg | INTRAMUSCULAR | Status: DC | PRN
Start: 2020-05-07 — End: 2020-05-07

## 2020-05-07 MED ORDER — DOCUSATE SODIUM 100 MG PO CAPS
100.0000 mg | ORAL_CAPSULE | Freq: Two times a day (BID) | ORAL | Status: DC
  Administered 2020-05-08 – 2020-05-12 (×9): 100 mg via ORAL
  Filled 2020-05-07 (×10): qty 1

## 2020-05-07 MED ORDER — PHENYLEPHRINE 40 MCG/ML (10ML) SYRINGE FOR IV PUSH (FOR BLOOD PRESSURE SUPPORT): 80.0000 ug | PREFILLED_SYRINGE | INTRAVENOUS | Status: DC | PRN

## 2020-05-07 MED ORDER — LACTATED RINGERS IV SOLN: 500.0000 mL | Freq: Once | INTRAVENOUS | Status: AC

## 2020-05-07 MED ORDER — PRENATAL MULTIVITAMIN CH
1.0000 | ORAL_TABLET | Freq: Every day | ORAL | Status: DC
  Administered 2020-05-08 – 2020-05-12 (×5): 1 via ORAL
  Filled 2020-05-07 (×5): qty 1

## 2020-05-07 MED ORDER — LIDOCAINE HCL (PF) 1 % IJ SOLN
INTRAMUSCULAR | Status: DC | PRN
  Administered 2020-05-07: 3 mL via SUBCUTANEOUS

## 2020-05-07 MED ORDER — COCONUT OIL OIL: 1.0000 "application " | TOPICAL_OIL | Status: DC | PRN

## 2020-05-07 MED ORDER — EPHEDRINE 5 MG/ML INJ: 10.0000 mg | INTRAVENOUS | Status: DC | PRN

## 2020-05-07 MED ORDER — OXYTOCIN-SODIUM CHLORIDE 30-0.9 UT/500ML-% IV SOLN
INTRAVENOUS | Status: AC
  Filled 2020-05-07: qty 500

## 2020-05-07 MED ORDER — LABETALOL HCL 5 MG/ML IV SOLN: 80.0000 mg | INTRAVENOUS | Status: DC | PRN

## 2020-05-07 MED ORDER — FENTANYL 2.5 MCG/ML W/ROPIVACAINE 0.15% IN NS 100 ML EPIDURAL (ARMC)
EPIDURAL | Status: DC | PRN
  Administered 2020-05-07: 12 mL/h via EPIDURAL

## 2020-05-07 MED ORDER — LABETALOL HCL 5 MG/ML IV SOLN
40.0000 mg | INTRAVENOUS | Status: DC | PRN
  Administered 2020-05-07: 40 mg via INTRAVENOUS
  Filled 2020-05-07: qty 8

## 2020-05-07 MED ORDER — FERROUS SULFATE 325 (65 FE) MG PO TABS
325.0000 mg | ORAL_TABLET | Freq: Two times a day (BID) | ORAL | Status: DC
  Administered 2020-05-08 – 2020-05-12 (×8): 325 mg via ORAL
  Filled 2020-05-07 (×8): qty 1

## 2020-05-07 MED ORDER — HYDRALAZINE HCL 20 MG/ML IJ SOLN: 10.0000 mg | INTRAMUSCULAR | Status: DC | PRN

## 2020-05-07 MED ORDER — ONDANSETRON HCL 4 MG/2ML IJ SOLN: 4.0000 mg | INTRAMUSCULAR | Status: DC | PRN

## 2020-05-07 MED ORDER — WITCH HAZEL-GLYCERIN EX PADS: 1.0000 "application " | MEDICATED_PAD | CUTANEOUS | Status: DC | PRN

## 2020-05-07 MED ORDER — LABETALOL HCL 5 MG/ML IV SOLN
80.0000 mg | INTRAVENOUS | Status: DC | PRN
  Administered 2020-05-07: 80 mg via INTRAVENOUS
  Filled 2020-05-07: qty 16

## 2020-05-07 MED ORDER — TETANUS-DIPHTH-ACELL PERTUSSIS 5-2.5-18.5 LF-MCG/0.5 IM SUSY
0.5000 mL | PREFILLED_SYRINGE | Freq: Once | INTRAMUSCULAR | Status: AC
  Administered 2020-05-12: 0.5 mL via INTRAMUSCULAR
  Filled 2020-05-07: qty 0.5

## 2020-05-07 MED ORDER — LABETALOL HCL 5 MG/ML IV SOLN
40.0000 mg | INTRAVENOUS | Status: DC | PRN
Start: 2020-05-07 — End: 2020-05-07

## 2020-05-07 MED ORDER — BENZOCAINE-MENTHOL 20-0.5 % EX AERO
1.0000 "application " | INHALATION_SPRAY | CUTANEOUS | Status: DC | PRN
  Administered 2020-05-07: 1 via TOPICAL
  Filled 2020-05-07: qty 56

## 2020-05-07 MED ORDER — DIPHENHYDRAMINE HCL 25 MG PO CAPS: 25.0000 mg | ORAL_CAPSULE | Freq: Four times a day (QID) | ORAL | Status: DC | PRN

## 2020-05-07 MED ORDER — LIDOCAINE-EPINEPHRINE (PF) 1.5 %-1:200000 IJ SOLN
INTRAMUSCULAR | Status: DC | PRN
  Administered 2020-05-07: 3 mL via EPIDURAL

## 2020-05-07 MED ORDER — ACETAMINOPHEN 325 MG PO TABS
650.0000 mg | ORAL_TABLET | ORAL | Status: DC | PRN
  Administered 2020-05-08 – 2020-05-11 (×11): 650 mg via ORAL
  Filled 2020-05-07 (×11): qty 2

## 2020-05-07 MED ORDER — ONDANSETRON HCL 4 MG PO TABS: 4.0000 mg | ORAL_TABLET | ORAL | Status: DC | PRN

## 2020-05-07 MED ORDER — SIMETHICONE 80 MG PO CHEW: 80.0000 mg | CHEWABLE_TABLET | ORAL | Status: DC | PRN

## 2020-05-07 MED ORDER — LABETALOL HCL 5 MG/ML IV SOLN: 20.0000 mg | INTRAVENOUS | Status: DC | PRN

## 2020-05-07 MED ORDER — IBUPROFEN 600 MG PO TABS
600.0000 mg | ORAL_TABLET | Freq: Four times a day (QID) | ORAL | Status: DC
  Administered 2020-05-07 – 2020-05-12 (×17): 600 mg via ORAL
  Filled 2020-05-07 (×18): qty 1

## 2020-05-07 MED ORDER — DIBUCAINE (PERIANAL) 1 % EX OINT: 1.0000 "application " | TOPICAL_OINTMENT | CUTANEOUS | Status: DC | PRN

## 2020-05-07 MED ORDER — FENTANYL 2.5 MCG/ML W/ROPIVACAINE 0.15% IN NS 100 ML EPIDURAL (ARMC)
EPIDURAL | Status: AC
  Filled 2020-05-07: qty 100

## 2020-05-07 MED ORDER — BUPIVACAINE HCL (PF) 0.25 % IJ SOLN
INTRAMUSCULAR | Status: DC | PRN
  Administered 2020-05-07: 3 mL via EPIDURAL

## 2020-05-07 NOTE — Progress Notes (Signed)
Labor Progress Note  Tina Parrish is a 35 y.o. [phone] at [redacted]w[redacted]d by LMP admitted for induction of labor due to Hypertension.  Subjective: Feeling UCs but not intense, she has been able to sleep through them.   Objective: BP (!) 136/93   Pulse 88   Temp 98.1 F (36.7 C) (Oral)   Resp 14   Ht 5' 1.5" (1.562 m)   Wt 85.3 kg   LMP 07/27/2019 (Approximate)   BMI 34.95 kg/m   Fetal Assessment: FHT:  FHR: 145 bpm, variability: moderate,  accelerations:  Absent,  decelerations:  Absent Category/reactivity:  Category I UC:   regular, every 2-4 minutes SVE:    Dilation: 3cm  Effacement: 65%  Station:  -3  Consistency: soft  Position: posterior  Membrane status: AROM'd at 1302 Amniotic color: clear  Labs: Lab Results  Component Value Date   WBC 12.1 (H) 05/06/2020   HGB 9.0 (L) 05/06/2020   HCT 29.1 (L) 05/06/2020   MCV 82.9 05/06/2020   PLT 197 05/06/2020    Assessment / Plan: Induction of labor due to gestational hypertension,  progressing well on pitocin  Labor: Progressing on Pitocin, will continue to increase then AROM Preeclampsia:   05/07/20 1300 136/93Abnormal  05/07/20 1141 130/82  05/07/20 1132 129/92Abnormal  05/07/20 1121 134/92Abnormal  05/07/20 1106 130/77  05/07/20 1056 116/79  05/07/20 1046 124/84  05/07/20 1030 128/82  05/07/20 1004 132/91Abnormal  05/07/20 0930 144/88Abnormal   - Keep BPs under 149/99  - Labetalol IV for elevated BPs  40mg   0943  80mg  1018 - Labs  AST/ALT WNL  PLT WNL  PCR 130  Drug screen: pos for Cocaine  Fetal Wellbeing:  Category I Pain Control:  Labor support without medications I/D:  Afebrile, GBS neg, AROM'd Anticipated MOD:  NSVD  , CNM 05/07/2020, 1:08 PM

## 2020-05-07 NOTE — Anesthesia Procedure Notes (Signed)
Epidural Patient location during procedure: OB Start time: 05/07/2020 2:07 PM End time: 05/07/2020 2:15 PM  Staffing Anesthesiologist: Yves Dill, MD Resident/CRNA: Karoline Caldwell, CRNA Performed: resident/CRNA   Preanesthetic Checklist Completed: patient identified, IV checked, site marked, risks and benefits discussed, surgical consent, monitors and equipment checked, pre-op evaluation and timeout performed  Epidural Patient position: sitting Prep: ChloraPrep Patient monitoring: heart rate, continuous pulse ox and blood pressure Approach: midline Location: L3-L4 Injection technique: LOR air  Needle:  Needle type: Tuohy  Needle gauge: 17 G Needle length: 9 cm and 9 Needle insertion depth: 7 cm Catheter type: closed end flexible Catheter size: 19 Gauge Catheter at skin depth: 12 cm Test dose: negative and 1.5% lidocaine with Epi 1:200 K  Assessment Events: blood not aspirated, injection not painful, no injection resistance, no paresthesia and negative IV test  Additional Notes 1 attempt Pt. Evaluated and documentation done after procedure finished. Patient identified. Risks/Benefits/Options discussed with patient including but not limited to bleeding, infection, nerve damage, paralysis, failed block, incomplete pain control, headache, blood pressure changes, nausea, vomiting, reactions to medication both or allergic, itching and postpartum back pain. Confirmed with bedside nurse the patient's most recent platelet count. Confirmed with patient that they are not currently taking any anticoagulation, have any bleeding history or any family history of bleeding disorders. Patient expressed understanding and wished to proceed. All questions were answered. Sterile technique was used throughout the entire procedure. Please see nursing notes for vital signs. Test dose was given through epidural catheter and negative prior to continuing to dose epidural or start infusion. Warning signs of  high block given to the patient including shortness of breath, tingling/numbness in hands, complete motor block, or any concerning symptoms with instructions to call for help. Patient was given instructions on fall risk and not to get out of bed. All questions and concerns addressed with instructions to call with any issues or inadequate analgesia.   Patient tolerated the insertion well without immediate complications.Reason for block:procedure for pain

## 2020-05-07 NOTE — Discharge Summary (Signed)
Obstetrical Discharge Summary  Patient Name: Tina Parrish DOB: 1985-08-02 MRN: [id number]  Date of Admission: 05/06/2020 Date of Delivery: 05/07/20 Delivered by: Anselm Pancoast Date of Discharge: 05/12/2020  Primary OB: [facility]  YQM:VHQIONG'E last menstrual period was 07/27/2019 (approximate). EDC Estimated Date of Delivery: 05/13/20 Gestational Age at Delivery: [redacted]w[redacted]d   Antepartum complications:  1. Chronic HTN 2. Inadequate prenatal care - lapse in care from 23 to 39 weeks 3. Obesity in pregnancy - BMI35 4. History of syphilis  5. Positive gonorrhea/chlamysdia in pregnancy  6. Positive UDS - 01/18/2020 cocaine and THC and 05/06/20 Cocaine 7. Asthma  8. Kidney stones in pregnancy  Admitting Diagnosis: CHTN  Secondary Diagnosis: Patient Active Problem List   Diagnosis Date Noted  . NSVD (normal spontaneous vaginal delivery) 05/07/2020  . Kidney stone complicating pregnancy 03/05/2020  . History of substance abuse (HCC) 12/27/2019  . Marijuana abuse 12/27/2019  . Mild intermittent asthma without complication 12/27/2019  . Supervision of other high risk pregnancy, antepartum 11/13/2019  . Positive urine drug screen 10/29/19 MJ 11/06/2019  . UTI (urinary tract infection) during pregnancy 10/29/19 E.Coli 50-100,000 & Serratia Marcescens 11/06/2019  . Asthma 10/29/2019  . GERD (gastroesophageal reflux disease) 10/29/2019  . Gonorrhea and chlamydia (treated 10/2019) 10/29/2019  . Supervision of high risk pregnancy, antepartum 10/29/2019  . Abnormal Pap smear of cervix 12/18/2018  . Former smoker (last use 09/2019) 12/08/2018  . 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) 06/14/2018  . Chronic hypertension 10/14/2014  . BMI 31.0-31.9,adult 04/12/2014    Augmentation: AROM, Pitocin and Cytotec Complications: None  Intrapartum complications/course:  Delivery Type: spontaneous vaginal delivery Anesthesia:  epidural Placenta: spontaneous Laceration: none Episiotomy: none Newborn Data: Live born female  Birth Weight:  7lbs 3oz, 3260g APGAR: 7, 9  Newborn Delivery   Birth date/time: 05/07/2020 15:42:00 Delivery type: Vaginal, Spontaneous      35yo [id number] at 39+4wks presenting for IOL for CHTN, AROM with clear fluid.  She progressed to complete and pushed over an intact perineum and delivered the fetal head, followed promptly by the shoulders. She was in control the whole time, and the baby placed on the maternal abdomen. Delayed cord clamping and the pt's friend cut the baby's cord, while the baby was skin to skin. The placenta delivered spontaneously and intact. No lacerations. Mom and baby tolerated the procedure well.   Postpartum Procedures: 1.  Presumed Sepsis on PPD2, tachypnea and tachycardia with fever; started on triple Abx due to left side abd pain. Left side CVAT on exam 05/09/20- s/p CT scan showing atelectasis LLL and left kidney edema suggestive of pyelonephritis. Urine and blood cultures Positive for Serratia. Abx changed to Cefepime 2gm q8hr.  ID evaluated patient, recommend Cipro 500mg  BID for 10 days.  First dose given in hospital prior to discharge.  WBC 13.1 -> 28.0->29.5->28.4-> 25.7 -> 19.2 -> 15.9  2. CHTN- persistent mild range BPs, started Labetalol 200mg  PO BID on 3/27  Post partum course:  Patient had a postpartum course complicated sepsis with positive urine culture for serratia and blood cultures with serratia. She was treated with IV antibiotics and was fever free for > 24 hours prior to discharge.  Blood pressures were controlled with procardia and labetalol.   By time of discharge on PPD#4, she was fever free, her pain was controlled on oral pain medications; she had appropriate lochia and was ambulating, voiding without difficulty and tolerating regular diet.  Transition of care  team was consulted d/t lapse in prenatal care and positive UDS for cocaine.  She was  deemed stable for discharge to home.    Discharge Physical Exam:  BP (!) 122/92 (BP Location: Left Arm) Comment: nurse Harless Litten notified  Pulse 97   Temp 98.5 F (36.9 C) (Oral)   Resp 20   Ht 5' 1.5" (1.562 m)   Wt 85.3 kg   LMP 07/27/2019 (Approximate)   SpO2 100%   Breastfeeding Unknown   BMI 34.95 kg/m   General: alert and no distress Pulm: normal respiratory effort Lochia: appropriate Abdomen: soft, NT Uterine Fundus: firm, below umbilicus Perineum: minimal edema, intact  Extremities: No evidence of DVT seen on physical exam. No lower extremity edema. Edinburgh:  Edinburgh Postnatal Depression Scale Screening Tool 05/08/2020 05/08/2020  I have been able to laugh and see the funny side of things. 0 (No Data)  I have looked forward with enjoyment to things. 0 -  I have blamed myself unnecessarily when things went wrong. 0 -  I have been anxious or worried for no good reason. 0 -  I have felt scared or panicky for no good reason. 0 -  Things have been getting on top of me. 0 -  I have been so unhappy that I have had difficulty sleeping. 0 -  I have felt sad or miserable. 0 -  I have been so unhappy that I have been crying. 0 -  The thought of harming myself has occurred to me. 0 -  Edinburgh Postnatal Depression Scale Total 0 -     Labs: CBC Latest Ref Rng & Units 05/12/2020 05/11/2020 05/10/2020  WBC 4.0 - 10.5 K/uL 15.9(H) 19.2(H) 25.7(H)  Hemoglobin 12.0 - 15.0 g/dL 8.3(T) 7.9(L) 8.2(L)  Hematocrit 36.0 - 46.0 % 26.0(L) 24.2(L) 25.2(L)  Platelets 150 - 400 K/uL 295 238 196   O POS Hemoglobin  Date Value Ref Range Status  05/12/2020 8.4 (L) 12.0 - 15.0 g/dL Final  51/76/1607 37.1 11.1 - 15.9 g/dL Final  08/11/9483 46.2 11.1 - 15.9 g/dL Final   HCT  Date Value Ref Range Status  05/12/2020 26.0 (L) 36.0 - 46.0 % Final   Hematocrit  Date Value Ref Range Status  10/29/2019 34.9 34.0 - 46.6 % Final  10/29/2019 34.7 34.0 - 46.6 % Final    Disposition:  stable, discharge to home Baby Feeding: formula Baby Disposition: home with mom  Contraception: TBD  Prenatal Labs:  Blood type/Rh O pos  Antibody screen neg  Rubella Immune  Varicella Immune  RPR Reactive 1:1  HBsAg Neg  HIV NR  GC Neg  Chlamydia Neg  Genetic screening Not done  1 hour GTT Not done  3 hour GTT Not done  GBS Neg  Rh Immune globulin given: pos Rubella vaccine given: Immune Varicella vaccine given: Immune Tdap vaccine given in AP or PP setting: declined  Flu vaccine given in AP or PP setting: declined   Plan: SHONIQUE PELPHREY was discharged to home in good condition. Follow-up appointment in 3-4 days for blood pressure check and with delivering provider in 6 weeks.  Discharge Instructions: Per After Visit Summary. Activity: Advance as tolerated. Pelvic rest for 6 weeks.   Diet: Regular Discharge Medications: Allergies as of 05/12/2020   No Known Allergies     Medication List    STOP taking these medications   aspirin EC 81 MG tablet   Flovent HFA 110 MCG/ACT inhaler Generic drug: fluticasone   ondansetron 4 MG  tablet Commonly known as: Zofran   oxyCODONE 5 MG immediate release tablet Commonly known as: Oxy IR/ROXICODONE   promethazine 25 MG tablet Commonly known as: PHENERGAN   tamsulosin 0.4 MG Caps capsule Commonly known as: FLOMAX     TAKE these medications   acetaminophen 325 MG tablet Commonly known as: TYLENOL Take 3 tablets (975 mg total) by mouth every 6 (six) hours.   albuterol 108 (90 Base) MCG/ACT inhaler Commonly known as: VENTOLIN HFA Inhale 2 puffs into the lungs every 6 (six) hours as needed for wheezing or shortness of breath.   ciprofloxacin 500 MG tablet Commonly known as: CIPRO Take 1 tablet (500 mg total) by mouth 2 (two) times daily for 10 days.   docusate sodium 100 MG capsule Commonly known as: COLACE Take 1 capsule (100 mg total) by mouth daily.   ferrous sulfate 325 (65 FE) MG tablet Take 1 tablet  (325 mg total) by mouth 2 (two) times daily with a meal.   ibuprofen 600 MG tablet Commonly known as: ADVIL Take 1 tablet (600 mg total) by mouth every 6 (six) hours as needed for mild pain or moderate pain.   labetalol 200 MG tablet Commonly known as: NORMODYNE Take 1 tablet (200 mg total) by mouth 2 (two) times daily.   NIFEdipine 30 MG 24 hr tablet Commonly known as: ADALAT CC Take 1 tablet (30 mg total) by mouth daily. Start taking on: May 13, 2020   Prenatal Vitamin 27-0.8 MG Tabs Take 1 tablet by mouth daily.      Outpatient follow up:   Follow-up Information    [facility]. Schedule an appointment as soon as possible for a visit on 05/15/2020.   Why: blood pressure check on 3/31  @ 11 am with Margaretmary Eddy, CNM Contact information: [address] [facility] [place] [address] [phone]       Haroldine Laws, CNM. Schedule an appointment as soon as possible for a visit in 6 week(s).   Specialty: Certified Nurse Midwife Why: postpartum visit  Contact information: [address] [place] [address] [phone]               Signed:  Margaretmary Eddy, CNM Certified Nurse Midwife [facility]

## 2020-05-07 NOTE — Progress Notes (Signed)
Labor Progress Note  Tina Parrish is a 35 y.o. [phone] at [redacted]w[redacted]d by LMP admitted for induction of labor due to Hypertension.  Subjective: Assumed care. Pt is feeling UCs and has to breath through them.    Objective: BP (!) 133/96 (BP Location: Right Arm)   Pulse 88   Temp 98.2 F (36.8 C) (Oral)   Resp 18   Ht 5' 1.5" (1.562 m)   Wt 85.3 kg   LMP 07/27/2019 (Approximate)   BMI 34.95 kg/m   Fetal Assessment: FHT:  FHR: 140 bpm, variability: moderate,  accelerations:  Absent,  decelerations:  Absent Category/reactivity:  Category I UC:   regular, every 1-4 minutes SVE:    Dilation: 2cm - 3cm  Effacement: 65%  Station:  -3  Consistency: soft  Position: posterior  Membrane status: Intact Amniotic color: n/a  Labs: Lab Results  Component Value Date   WBC 12.1 (H) 05/06/2020   HGB 9.0 (L) 05/06/2020   HCT 29.1 (L) 05/06/2020   MCV 82.9 05/06/2020   PLT 197 05/06/2020    Assessment / Plan: Induction of labor due to gestational hypertension,  progressing well on pitocin  Labor: Progressing on Pitocin, will continue to increase then AROM Preeclampsia:   05/07/20 0810 139/91Abnormal  05/07/20 0711 133/96Abnormal  05/07/20 0610 120/76  05/07/20 0510 134/83  05/07/20 0410 118/78  05/07/20 0310 122/83  05/07/20 0210 131/79  05/07/20 0110 118/84  05/07/20 0102 138/79  05/07/20 0012 140/86   - Keep BPs under 140/90  - Labetalol 20mg  IV for elevated BPs Given : 20 mg :  : Intravenous 05/07/20 0735  Given : 20 mg :  : Intravenous 05/07/20 0101  Given : 20 mg :  : Intravenous 05/06/20 2258  Given : 20 mg :  : Intravenous 05/06/20 2007  - Labs  AST/ALT WNL  PLT WNL  PCR 130  Drug screen: pos for Cocaine  Fetal Wellbeing:  Category I Pain Control:  Labor support without medications I/D:  Afebrile, GBS neg, Intact Anticipated MOD:  NSVD  05/08/20, CNM 05/07/2020, 8:06 AM

## 2020-05-07 NOTE — Progress Notes (Signed)
Progress Note  BPs 05/07/20 0810 139/91Abnormal  05/07/20 0711 133/96Abnormal  05/07/20 0610 120/76   Labetalol - Give if BP > 140 or > 90 - Last given 05/07/20 at 0735 - Order for every 2 hours PRN  C/w Dr. Dalbert Garnet - will change to conditional Labetalol order to allow for more flexibility  Jenifer E Dantre [place]  05/07/2020 8:38 AM

## 2020-05-07 NOTE — Plan of Care (Signed)
Patient recovering well postpartum and bleeding been WNL. Pain 0/10 and patient denies wanting ibuprofen and tylenol for pain. BPs been WNL during recovery. Plans to transfer to mother baby unit for couplet care.

## 2020-05-07 NOTE — Progress Notes (Signed)
RN spoke to CNM at 0930 about patient BP 144/88. CNM in department to discuss plan of care for BP management. CNM orders 40 mg labetalol to be administered. RN administered 40 mg of labetalol at 0943. Repeat BP was 132/91. RN spoke to CNM at 1011 and order given to administer 80 mg labetalol. RN administered 80 mg labetaol at 1018 per verbal order. Repeat BP after 80 mg labetalol was 128/82.

## 2020-05-07 NOTE — Anesthesia Preprocedure Evaluation (Signed)
Anesthesia Evaluation  Patient identified by MRN, date of birth, ID band Patient awake    Reviewed: Allergy & Precautions, H&P , NPO status , Patient's Chart, lab work & pertinent test results  Airway Mallampati: II  TM Distance: >3 FB Neck ROM: full    Dental  (+) Chipped   Pulmonary asthma , Current Smoker,           Cardiovascular hypertension, Normal cardiovascular exam     Neuro/Psych PSYCHIATRIC DISORDERS Depression    GI/Hepatic GERD  Medicated and Poorly Controlled,  Endo/Other    Renal/GU      Musculoskeletal   Abdominal   Peds  Hematology   Anesthesia Other Findings   Reproductive/Obstetrics                             Anesthesia Physical Anesthesia Plan  ASA: II  Anesthesia Plan: Epidural   Post-op Pain Management:    Induction:   PONV Risk Score and Plan:   Airway Management Planned:   Additional Equipment:   Intra-op Plan:   Post-operative Plan:   Informed Consent: I have reviewed the patients History and Physical, chart, labs and discussed the procedure including the risks, benefits and alternatives for the proposed anesthesia with the patient or authorized representative who has indicated his/her understanding and acceptance.     Dental Advisory Given  Plan Discussed with: Anesthesiologist and CRNA  Anesthesia Plan Comments:         Anesthesia Quick Evaluation

## 2020-05-08 ENCOUNTER — Inpatient Hospital Stay: Payer: Medicaid Other

## 2020-05-08 LAB — URINALYSIS, ROUTINE W REFLEX MICROSCOPIC
Bacteria, UA: NONE SEEN
Bilirubin Urine: NEGATIVE
Glucose, UA: NEGATIVE mg/dL
Ketones, ur: NEGATIVE mg/dL
Nitrite: NEGATIVE
Protein, ur: NEGATIVE mg/dL
Specific Gravity, Urine: 1.006 (ref 1.005–1.030)
Squamous Epithelial / HPF: NONE SEEN (ref 0–5)
pH: 8 (ref 5.0–8.0)

## 2020-05-08 LAB — CBC
HCT: 26.2 % — ABNORMAL LOW (ref 36.0–46.0)
Hemoglobin: 8.3 g/dL — ABNORMAL LOW (ref 12.0–15.0)
MCH: 26 pg (ref 26.0–34.0)
MCHC: 31.7 g/dL (ref 30.0–36.0)
MCV: 82.1 fL (ref 80.0–100.0)
Platelets: 178 10*3/uL (ref 150–400)
RBC: 3.19 MIL/uL — ABNORMAL LOW (ref 3.87–5.11)
RDW: 15.7 % — ABNORMAL HIGH (ref 11.5–15.5)
WBC: 13.1 10*3/uL — ABNORMAL HIGH (ref 4.0–10.5)
nRBC: 0 % (ref 0.0–0.2)

## 2020-05-08 LAB — T.PALLIDUM AB, TOTAL: T Pallidum Abs: REACTIVE — AB

## 2020-05-08 MED ORDER — LACTATED RINGERS IV BOLUS (SEPSIS): 1000.0000 mL | Freq: Once | INTRAVENOUS | Status: DC

## 2020-05-08 MED ORDER — SODIUM CHLORIDE 0.9 % IV SOLN
2.0000 g | Freq: Four times a day (QID) | INTRAVENOUS | Status: DC
  Administered 2020-05-08 – 2020-05-09 (×4): 2 g via INTRAVENOUS
  Filled 2020-05-08: qty 2000
  Filled 2020-05-08: qty 2
  Filled 2020-05-08: qty 2000
  Filled 2020-05-08 (×2): qty 2
  Filled 2020-05-08 (×2): qty 2000

## 2020-05-08 MED ORDER — LACTATED RINGERS IV BOLUS (SEPSIS)
1000.0000 mL | Freq: Once | INTRAVENOUS | Status: AC
  Administered 2020-05-09: 1000 mL via INTRAVENOUS

## 2020-05-08 MED ORDER — CLINDAMYCIN PHOSPHATE 600 MG/50ML IV SOLN
600.0000 mg | INTRAVENOUS | Status: AC
  Administered 2020-05-09: 600 mg via INTRAVENOUS
  Filled 2020-05-08: qty 50

## 2020-05-08 MED ORDER — NIFEDIPINE ER OSMOTIC RELEASE 30 MG PO TB24
30.0000 mg | ORAL_TABLET | Freq: Every day | ORAL | Status: DC
  Administered 2020-05-08 – 2020-05-12 (×5): 30 mg via ORAL
  Filled 2020-05-08 (×5): qty 1

## 2020-05-08 MED ORDER — GENTAMICIN SULFATE 40 MG/ML IJ SOLN
1.5000 mg/kg | INTRAVENOUS | Status: AC
  Administered 2020-05-09: 130 mg via INTRAVENOUS
  Filled 2020-05-08: qty 3.25

## 2020-05-08 MED ORDER — SODIUM CHLORIDE 0.9 % IV BOLUS
500.0000 mL | Freq: Once | INTRAVENOUS | Status: AC
  Administered 2020-05-08: 500 mL via INTRAVENOUS

## 2020-05-08 NOTE — Progress Notes (Signed)
CODE SEPSIS - PHARMACY COMMUNICATION  **Broad Spectrum Antibiotics should be administered within 1 hour of Sepsis diagnosis**  Time Code Sepsis Called/Page Received: 2310  Antibiotics Ordered: Ampicillin  Time of 1st antibiotic administration: 2326  Otelia Sergeant, PharmD, [facility] 05/08/2020 11:13 PM

## 2020-05-08 NOTE — Progress Notes (Signed)
Postpartum Day  1  Subjective: no complaints, up ad lib, voiding and tolerating PO  Doing well, no concerns. Ambulating without difficulty, pain managed with PO meds, tolerating regular diet, and voiding without difficulty.   No fever/chills, chest pain, shortness of breath, nausea/vomiting, or leg pain. No nipple or breast pain. No headache, visual changes, or RUQ/epigastric pain.  Objective: BP (!) 143/92 (BP Location: Right Arm)   Pulse (!) 104   Temp 98.6 F (37 C)   Resp 18   Ht 5' 1.5" (1.562 m)   Wt 85.3 kg   LMP 07/27/2019 (Approximate)   SpO2 99%   Breastfeeding Unknown   BMI 34.95 kg/m    Physical Exam:  General: alert, cooperative and no distress Breasts: soft/nontender CV: RRR Pulm: nl effort, CTABL Abdomen: soft, non-tender, active bowel sounds Uterine Fundus: firm Perineum: minimal edema, intact Lochia: small to moderate DVT Evaluation: No evidence of DVT seen on physical exam.  Recent Labs    05/06/20 1309 05/08/20 0512  HGB 9.0* 8.3*  HCT 29.1* 26.2*  WBC 12.1* 13.1*  PLT 197 178    Assessment/Plan: 35 y.o. [id number] postpartum day # 1  -Continue routine postpartum care -Encouraged snug fitting bra, cold application, Tylenol PRN, and cabbage leaves for engorgement for formula feeding   -Acute blood loss anemia - hemodynamically stable and asymptomatic; start PO ferrous sulfate BID with stool softeners  -Immunization status:   all immunizations up to date  -Transition of care team consult ordered for lapse in prenatal care and positive UDS for cocaine  -Chronic HTN, BP's as follows: 05/08/20 7425 143/92Abnormal  05/08/20 04:05:51 134/86  05/07/20 23:29:48 126/80  05/07/20 20:18:08 134/84  05/07/20 19:12:50 140/91Abnormal  -Start Procardia 30mg  daily this morning    Disposition: Continue inpatient postpartum care    LOS: 2 days   , CNM 05/08/2020, 8:21 AM   ----- 05/10/2020  Certified Nurse Midwife [facility]  OB/GYN [facility]

## 2020-05-08 NOTE — TOC Initial Note (Addendum)
Transition of Care [facility]) - Initial/Assessment Note    Patient Details  Name: Tina Parrish MRN: [id number] Date of Birth: 07/17/1985  Transition of Care [facility]) CM/SW Contact:    [place] Cellar, RN Phone Number: 05/08/2020, 12:34 PM  Clinical Narrative:                 Spoke to patient regarding (+) UDS and lapse in prenatal care. Patient reports she had been partying and "acting crazy" before having the baby but did not normally do drugs. Has 3 other children with the oldest being 10. FOB is currently incarcerated however patient reports they are not together. Patient is a Tina Parrish and does not work outside the home. Reports she has a strong support system and  Has all equipment including car seat. No needs with transportation and already active with WIC. Plans to bottlefeed formula only. Request appointment with pediatrician Tina Parrish since the other children are already there. Discussed CPS report being completed due to concerns related to drug use. Patient states she has spoken to DSS before with her other children and understands need for report. Infant UDS (-)-pending cord blood. Patient reports she has strong support system with her mother and family.  [name] with multiple STD and limited prenatal care.    LVMM for [facility] DSS to file CPS report.   1:30pm: Completed CPS report.         Patient Goals and CMS Choice        Expected Discharge Plan and Services                                                Prior Living Arrangements/Services                       Activities of Daily Living Home Assistive Devices/Equipment: None ADL Screening (condition at time of admission) Patient's cognitive ability adequate to safely complete daily activities?: Yes Is the patient deaf or have difficulty hearing?: No Does the patient have difficulty seeing, even when wearing glasses/contacts?: No Does the patient have difficulty concentrating, remembering, or making  decisions?: No Patient able to express need for assistance with ADLs?: Yes Does the patient have difficulty dressing or bathing?: No Independently performs ADLs?: Yes (appropriate for developmental age) Does the patient have difficulty walking or climbing stairs?: No Weakness of Legs: None Weakness of Arms/Hands: None  Permission Sought/Granted                  Emotional Assessment              Admission diagnosis:  Chronic hypertension affecting pregnancy [O10.919] Hypertension affecting pregnancy [O16.9] Patient Active Problem List   Diagnosis Date Noted   NSVD (normal spontaneous vaginal delivery) 05/07/2020   Kidney stone complicating pregnancy 03/05/2020   History of substance abuse (HCC) 12/27/2019   Marijuana abuse 12/27/2019   Mild intermittent asthma without complication 12/27/2019   Supervision of other high risk pregnancy, antepartum 11/13/2019   Positive urine drug screen 10/29/19 MJ 11/06/2019   UTI (urinary tract infection) during pregnancy 10/29/19 E.Coli 50-100,000 & Serratia Marcescens 11/06/2019   Asthma 10/29/2019   GERD (gastroesophageal reflux disease) 10/29/2019   Gonorrhea and chlamydia (treated 10/2019) 10/29/2019   Supervision of high risk pregnancy, antepartum 10/29/2019   Abnormal Pap smear of cervix 12/18/2018   Former  smoker (last use 09/2019) 12/08/2018   Syphilis, 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) 06/14/2018   Chronic hypertension 10/14/2014   BMI 31.0-31.9,adult 04/12/2014   PCP:  Patient, No Pcp Per Pharmacy:   [facility] [address] ([address]), [place] - [address]) [place] [address] Phone: [phone] Fax: [phone]  [facility] [place], [place] - [address] [place] [address] Phone: [phone] Fax: [phone]  [facility] [phone] - [place], [place] -  [phone] [place] [address] Phone: [phone] Fax: [phone]     Social Determinants of Health (SDOH) Interventions    Readmission Risk Interventions No flowsheet data found.

## 2020-05-08 NOTE — Progress Notes (Signed)
Jeannett Senior, RN of bp/ hr

## 2020-05-08 NOTE — Anesthesia Postprocedure Evaluation (Signed)
Anesthesia Post Note  Patient: Tina Parrish  Procedure(s) Performed: AN AD HOC LABOR EPIDURAL  Patient location during evaluation: Mother Baby Anesthesia Type: Epidural Level of consciousness: awake, awake and alert, oriented and patient cooperative Pain management: pain level controlled Vital Signs Assessment: post-procedure vital signs reviewed and stable Respiratory status: spontaneous breathing and nonlabored ventilation Cardiovascular status: stable Postop Assessment: no headache, no backache, patient able to bend at knees, no apparent nausea or vomiting, able to ambulate and adequate PO intake Anesthetic complications: no   No complications documented.   Last Vitals:  Vitals:   05/07/20 2329 05/08/20 0405  BP: 126/80 134/86  Pulse: 95 (!) 101  Resp: 20 20  Temp: 36.9 C 36.7 C  SpO2: 100% 100%    Last Pain:  Vitals:   05/08/20 0405  TempSrc: Oral  PainSc:                  Lyn Records

## 2020-05-08 NOTE — Progress Notes (Signed)
Called to patient's room for increasing left sided pain, continued tachy and now tachypnic. Mild range fever earlier today, now with headache. On scheduled motrin.  Pain significantly under left rib anteriorly, but pain with palpation on left flank. Pt reports hx of kidney stones recently in pregnancy. No SOB. +dyspnea with deep breaths on left side. Pt reports feeling chilled.  She is s/p bolus of LR without improvement  Vitals:   05/08/20 2009 05/08/20 2236  BP: 132/74 134/80  Pulse: (!) 120 (!) 123  Resp: 18 (!) 24  Temp: 98.4 F (36.9 C) 99.3 F (37.4 C)  SpO2: 99% 100%   PE: CV- reg rhythm, +tachy to 120s Pulm: CTAB in 6 lung fields Flank: Left CVA tenderness Abd: +diffuse tenderness on left, possible fundal tenderness. Neg rebound and neg Rovsing. No RUQ or RLQ tenderness.   A/P: Likely infectious, unknown source. Possible pyelonephritis vs endometritis. Will assess for pneumonia as well. Unlikely DVT/PE, covid, other respiratory virus.  - chest ct - blood culture x2 - urine culture - sepsis labs - KUB - chest xray - lactated ringers bolus per sepsis protocol - Is and Os - strict monitoring of vitals - start amp/gent/clinda empirically while awaiting labs

## 2020-05-09 ENCOUNTER — Inpatient Hospital Stay: Payer: Medicaid Other

## 2020-05-09 LAB — CBC WITH DIFFERENTIAL/PLATELET
Abs Immature Granulocytes: 0.76 10*3/uL — ABNORMAL HIGH (ref 0.00–0.07)
Basophils Absolute: 0.1 10*3/uL (ref 0.0–0.1)
Basophils Relative: 0 %
Eosinophils Absolute: 0 10*3/uL (ref 0.0–0.5)
Eosinophils Relative: 0 %
HCT: 24.4 % — ABNORMAL LOW (ref 36.0–46.0)
Hemoglobin: 7.9 g/dL — ABNORMAL LOW (ref 12.0–15.0)
Immature Granulocytes: 3 %
Lymphocytes Relative: 5 %
Lymphs Abs: 1.3 10*3/uL (ref 0.7–4.0)
MCH: 26.3 pg (ref 26.0–34.0)
MCHC: 32.4 g/dL (ref 30.0–36.0)
MCV: 81.3 fL (ref 80.0–100.0)
Monocytes Absolute: 2.8 10*3/uL — ABNORMAL HIGH (ref 0.1–1.0)
Monocytes Relative: 10 %
Neutro Abs: 23.1 10*3/uL — ABNORMAL HIGH (ref 1.7–7.7)
Neutrophils Relative %: 82 %
Platelets: 164 10*3/uL (ref 150–400)
RBC: 3 MIL/uL — ABNORMAL LOW (ref 3.87–5.11)
RDW: 15.7 % — ABNORMAL HIGH (ref 11.5–15.5)
WBC: 28 10*3/uL — ABNORMAL HIGH (ref 4.0–10.5)
nRBC: 0 % (ref 0.0–0.2)

## 2020-05-09 LAB — URINE DRUG SCREEN, QUALITATIVE (ARMC ONLY)
Amphetamines, Ur Screen: NOT DETECTED
Barbiturates, Ur Screen: NOT DETECTED
Benzodiazepine, Ur Scrn: NOT DETECTED
Cannabinoid 50 Ng, Ur ~~LOC~~: NOT DETECTED
Cocaine Metabolite,Ur ~~LOC~~: NOT DETECTED
MDMA (Ecstasy)Ur Screen: NOT DETECTED
Methadone Scn, Ur: NOT DETECTED
Opiate, Ur Screen: NOT DETECTED
Phencyclidine (PCP) Ur S: NOT DETECTED
Tricyclic, Ur Screen: NOT DETECTED

## 2020-05-09 LAB — COMPREHENSIVE METABOLIC PANEL
ALT: 18 U/L (ref 0–44)
ALT: 17 U/L (ref 0–44)
AST: 27 U/L (ref 15–41)
AST: 28 U/L (ref 15–41)
Albumin: 2.5 g/dL — ABNORMAL LOW (ref 3.5–5.0)
Albumin: 2.6 g/dL — ABNORMAL LOW (ref 3.5–5.0)
Alkaline Phosphatase: 91 U/L (ref 38–126)
Alkaline Phosphatase: 89 U/L (ref 38–126)
Anion gap: 6 (ref 5–15)
Anion gap: 8 (ref 5–15)
BUN: 8 mg/dL (ref 6–20)
BUN: 8 mg/dL (ref 6–20)
CO2: 20 mmol/L — ABNORMAL LOW (ref 22–32)
CO2: 20 mmol/L — ABNORMAL LOW (ref 22–32)
Calcium: 8.6 mg/dL — ABNORMAL LOW (ref 8.9–10.3)
Calcium: 8.5 mg/dL — ABNORMAL LOW (ref 8.9–10.3)
Chloride: 109 mmol/L (ref 98–111)
Chloride: 108 mmol/L (ref 98–111)
Creatinine, Ser: 0.71 mg/dL (ref 0.44–1.00)
Creatinine, Ser: 0.61 mg/dL (ref 0.44–1.00)
GFR, Estimated: >60 mL/min (ref >60)
GFR, Estimated: >60 mL/min (ref >60)
Glucose, Bld: 119 mg/dL — ABNORMAL HIGH (ref 70–99)
Glucose, Bld: 145 mg/dL — ABNORMAL HIGH (ref 70–99)
Potassium: 3.2 mmol/L — ABNORMAL LOW (ref 3.5–5.1)
Potassium: 3.4 mmol/L — ABNORMAL LOW (ref 3.5–5.1)
Sodium: 135 mmol/L (ref 135–145)
Sodium: 136 mmol/L (ref 135–145)
Total Bilirubin: 0.6 mg/dL (ref 0.3–1.2)
Total Bilirubin: 0.8 mg/dL (ref 0.3–1.2)
Total Protein: 5.8 g/dL — ABNORMAL LOW (ref 6.5–8.1)
Total Protein: 6 g/dL — ABNORMAL LOW (ref 6.5–8.1)

## 2020-05-09 LAB — URINALYSIS, COMPLETE (UACMP) WITH MICROSCOPIC
Bacteria, UA: NONE SEEN
Bilirubin Urine: NEGATIVE
Glucose, UA: NEGATIVE mg/dL
Ketones, ur: NEGATIVE mg/dL
Nitrite: NEGATIVE
Protein, ur: NEGATIVE mg/dL
Specific Gravity, Urine: 1.019 (ref 1.005–1.030)
Squamous Epithelial / HPF: NONE SEEN (ref 0–5)
pH: 7 (ref 5.0–8.0)

## 2020-05-09 LAB — CBC
HCT: 25.8 % — ABNORMAL LOW (ref 36.0–46.0)
HCT: 25 % — ABNORMAL LOW (ref 36.0–46.0)
Hemoglobin: 8.5 g/dL — ABNORMAL LOW (ref 12.0–15.0)
Hemoglobin: 8.1 g/dL — ABNORMAL LOW (ref 12.0–15.0)
MCH: 26.6 pg (ref 26.0–34.0)
MCH: 26.3 pg (ref 26.0–34.0)
MCHC: 32.9 g/dL (ref 30.0–36.0)
MCHC: 32.4 g/dL (ref 30.0–36.0)
MCV: 80.9 fL (ref 80.0–100.0)
MCV: 81.2 fL (ref 80.0–100.0)
Platelets: 182 10*3/uL (ref 150–400)
Platelets: 167 10*3/uL (ref 150–400)
RBC: 3.19 MIL/uL — ABNORMAL LOW (ref 3.87–5.11)
RBC: 3.08 MIL/uL — ABNORMAL LOW (ref 3.87–5.11)
RDW: 15.6 % — ABNORMAL HIGH (ref 11.5–15.5)
RDW: 15.6 % — ABNORMAL HIGH (ref 11.5–15.5)
WBC: 29.5 10*3/uL — ABNORMAL HIGH (ref 4.0–10.5)
WBC: 28.4 10*3/uL — ABNORMAL HIGH (ref 4.0–10.5)
nRBC: 0 % (ref 0.0–0.2)
nRBC: 0 % (ref 0.0–0.2)

## 2020-05-09 LAB — RESP PANEL BY RT-PCR (FLU A&B, COVID) ARPGX2
Influenza A by PCR: NEGATIVE
Influenza B by PCR: NEGATIVE
SARS Coronavirus 2 by RT PCR: NEGATIVE

## 2020-05-09 LAB — APTT: aPTT: 33 seconds (ref 24–36)

## 2020-05-09 LAB — LACTIC ACID, PLASMA
Lactic Acid, Venous: 1.7 mmol/L (ref 0.5–1.9)
Lactic Acid, Venous: 1.5 mmol/L (ref 0.5–1.9)

## 2020-05-09 LAB — PROTIME-INR
INR: 1.1 (ref 0.8–1.2)
Prothrombin Time: 14.1 seconds (ref 11.4–15.2)

## 2020-05-09 MED ORDER — GENTAMICIN SULFATE 40 MG/ML IJ SOLN
5.0000 mg/kg | INTRAVENOUS | Status: DC
  Filled 2020-05-09: qty 10.75

## 2020-05-09 MED ORDER — IOHEXOL 300 MG/ML  SOLN
100.0000 mL | Freq: Once | INTRAMUSCULAR | Status: AC | PRN
  Administered 2020-05-09: 100 mL via INTRAVENOUS

## 2020-05-09 MED ORDER — GENTAMICIN SULFATE 40 MG/ML IJ SOLN
320.0000 mg | Freq: Once | INTRAVENOUS | Status: AC
  Administered 2020-05-09: 320 mg via INTRAVENOUS
  Filled 2020-05-09: qty 8

## 2020-05-09 MED ORDER — SODIUM CHLORIDE 0.9 % IV SOLN
2.0000 g | Freq: Four times a day (QID) | INTRAVENOUS | Status: DC
  Administered 2020-05-10 – 2020-05-11 (×6): 2 g via INTRAVENOUS
  Filled 2020-05-09: qty 2000
  Filled 2020-05-09 (×2): qty 2
  Filled 2020-05-09 (×3): qty 2000
  Filled 2020-05-09: qty 2
  Filled 2020-05-09: qty 2000

## 2020-05-09 MED ORDER — CLINDAMYCIN PHOSPHATE 900 MG/50ML IV SOLN
900.0000 mg | Freq: Three times a day (TID) | INTRAVENOUS | Status: DC
  Administered 2020-05-09 – 2020-05-11 (×6): 900 mg via INTRAVENOUS
  Filled 2020-05-09 (×8): qty 50

## 2020-05-09 MED ORDER — TRAMADOL HCL 50 MG PO TABS: 50.0000 mg | ORAL_TABLET | Freq: Four times a day (QID) | ORAL | Status: DC | PRN

## 2020-05-09 MED ORDER — OXYCODONE HCL 5 MG PO TABS
5.0000 mg | ORAL_TABLET | ORAL | Status: DC | PRN
  Administered 2020-05-09 – 2020-05-10 (×6): 5 mg via ORAL
  Filled 2020-05-09 (×7): qty 1

## 2020-05-09 MED ORDER — CHEWING GUM (ORBIT) SUGAR FREE
1.0000 | CHEWING_GUM | ORAL | Status: DC | PRN
  Filled 2020-05-09: qty 1

## 2020-05-09 MED ORDER — SODIUM CHLORIDE 0.9 % IV SOLN: INTRAVENOUS | Status: DC

## 2020-05-09 NOTE — Progress Notes (Signed)
Post Partum Day 2 Subjective:  c/o left flank pain with inspiration  Day 1 abx for possible sepsis, triple ABX   ctscan left nephritis  And early LLL pneumonia Objective: Blood pressure 117/71, pulse (!) 119, temperature 98.3 F (36.8 C), temperature source Oral, resp. rate 20, height 5' 1.5" (1.562 m), weight 85.3 kg, last menstrual period 07/27/2019, SpO2 99 %, unknown if currently breastfeeding.  Physical Exam:  General: alert and cooperative Abd: mild LUQ TTP  Lungs CTA  Uterine Fundus:none tender  Incision: n/a   Recent Labs    05/09/20 0417 05/09/20 1029  HGB 8.5* 8.1*  HCT 25.8* 25.0*  repeat covid test neg   Assessment/Plan: Clinically pt looks nontoxic  Left nephritis and early left LL pneumonia Cont triple abx  IS Blood cultures if respikes temp .  Repeat labs in am    LOS: 3 days   Suzy Bouchard 05/09/2020, 5:57 PM

## 2020-05-09 NOTE — Progress Notes (Signed)
Following for Code Sepsis   Messaged Bedside RN about need for Lactic acid and blood cultures to be collected. Noticed Abx already given

## 2020-05-09 NOTE — Progress Notes (Addendum)
Pharmacy Antibiotic Note  Tina Parrish is a 35 y.o. female admitted on 05/06/2020 with possible pyelonephritis vs endometritis.  Pharmacy has been consulted for gentamicin dosing. She received a single dose of gentamicin 130 mg at 0529 this morning. There is no evidence of renal impairment. She is postpartum day 2.  Plan: complete remainder of 7 mg/kg IV gentamicin dose using adjusted body weight of 64 kg (320 mg)  Obtain level 8 hours after gentamicin dose complete  Use [place] nomogram to confirm/modify dosage interval  Height: 5' 1.5" (156.2 cm) Weight: 85.3 kg (188 lb) IBW/kg (Calculated) : 48.95  Temp (24hrs), Avg:99.2 F (37.3 C), Min:98.1 F (36.7 C), Max:101.1 F (38.4 C)  Recent Labs  Lab 05/06/20 1309 05/08/20 0512 05/09/20 0104 05/09/20 0417  WBC 12.1* 13.1* 28.0* 29.5*  CREATININE 0.56  --  0.71  --   LATICACIDVEN  --   --  1.7 1.5    Estimated Creatinine Clearance: 101.2 mL/min (by C-G formula based on SCr of 0.71 mg/dL).    No Known Allergies  Antimicrobials this admission: ampicicillin 3/25 >>  clindamicin 3/25 >>  gentamicin 3/25 >>  Microbiology results: 3/25 BCx: NG < 12 hours 3/24 UCx: pending 3/22 Grp B strep: negative 3/22 chlamydia, N gonorrhoeae: negative 3/22 RPR: reactive titer 1:1 (PMH of syphilis s/p treatment)   Thank you for allowing pharmacy to be a part of this patient's care.  Tina Parrish 05/09/2020 10:01 AM

## 2020-05-09 NOTE — Progress Notes (Signed)
Post Partum Day 3 Subjective:  Tolerating regular diet, pain with PO meds, voiding and ambulating without difficulty. Passing gas and had BM yesterday. Reports scant bleeding. Continues to have left side mid to lower abdominal pain, worse when she moves around. Also c/o low back and flank pain.   No CP SOB, Chills, N/V or leg pain; denies nipple or breast pain, no HA change of vision, RUQ/epigastric pain  Objective: BP 133/82 (BP Location: Right Arm)   Pulse (!) 119   Temp 98.6 F (37 C) (Oral)   Resp (!) 24   Ht 5' 1.5" (1.562 m)   Wt 85.3 kg   LMP 07/27/2019 (Approximate)   SpO2 99%   Breastfeeding Unknown   BMI 34.95 kg/m  VS review: T max 101.1 at 2316 last night, continues to have tachycardia pulse 110-130s   Physical Exam:  General: NAD Breasts: soft/nontender CV: RRR Pulm: nl effort, CTABL Abdomen: soft, BS x 4, left lower and mid abdominal pain to palpation. Bilateral CVAT, Left >Right.  Perineum: intact Lochia: scant Uterine Fundus: fundus firm, nontender and 3 fb below umbilicus DVT Evaluation: no cords, ttp LEs   Recent Labs    05/09/20 0104 05/09/20 0417  HGB 7.9* 8.5*  HCT 24.4* 25.8*  WBC 28.0* 29.5*  PLT 164 182    Assessment/Plan: 34 y.o. [id number] postpartum day # 3  - Continue routine PP care - encouraged snug fitting bra and cabbage leaves for bottlefeeding.  - Sepsis- meets criteria as of last night with elevated WBC, tachycardia and tachypnea. Normal resp rate now, continues tachycardia. Repeat CBC and CMP today ordered, Renal US to rule out pyelo vs kidney stone due to CVAT. IVF restarted with NS.  D/W Dr schermerhorn, pt to have CT abd/pelvis w/ contrast now, Natasha Bence and Clinda dosing clarified and requested pharmacy consult for Lantana dosing.   - Acute blood loss anemia - hemodynamically stable and asymptomatic; start po ferrous sulfate BID with stool softeners     Disposition: Does not desire Dc home today.     Randa Ngo,  CNM 05/09/2020  10:36 AM

## 2020-05-10 LAB — CBC
HCT: 25.2 % — ABNORMAL LOW (ref 36.0–46.0)
Hemoglobin: 8.2 g/dL — ABNORMAL LOW (ref 12.0–15.0)
MCH: 26.1 pg (ref 26.0–34.0)
MCHC: 32.5 g/dL (ref 30.0–36.0)
MCV: 80.3 fL (ref 80.0–100.0)
Platelets: 196 10*3/uL (ref 150–400)
RBC: 3.14 MIL/uL — ABNORMAL LOW (ref 3.87–5.11)
RDW: 15.7 % — ABNORMAL HIGH (ref 11.5–15.5)
WBC: 25.7 10*3/uL — ABNORMAL HIGH (ref 4.0–10.5)
nRBC: 0 % (ref 0.0–0.2)

## 2020-05-10 LAB — BLOOD CULTURE ID PANEL (REFLEXED) - BCID2

## 2020-05-10 LAB — COMPREHENSIVE METABOLIC PANEL
ALT: 19 U/L (ref 0–44)
AST: 23 U/L (ref 15–41)
Albumin: 2.5 g/dL — ABNORMAL LOW (ref 3.5–5.0)
Alkaline Phosphatase: 107 U/L (ref 38–126)
Anion gap: 8 (ref 5–15)
BUN: 7 mg/dL (ref 6–20)
CO2: 21 mmol/L — ABNORMAL LOW (ref 22–32)
Calcium: 8.4 mg/dL — ABNORMAL LOW (ref 8.9–10.3)
Chloride: 110 mmol/L (ref 98–111)
Creatinine, Ser: 0.6 mg/dL (ref 0.44–1.00)
GFR, Estimated: >60 mL/min (ref >60)
Glucose, Bld: 57 mg/dL — ABNORMAL LOW (ref 70–99)
Potassium: 3.3 mmol/L — ABNORMAL LOW (ref 3.5–5.1)
Sodium: 139 mmol/L (ref 135–145)
Total Bilirubin: 0.5 mg/dL (ref 0.3–1.2)
Total Protein: 6.3 g/dL — ABNORMAL LOW (ref 6.5–8.1)

## 2020-05-10 LAB — GENTAMICIN LEVEL, RANDOM: Gentamicin Rm: 1 ug/mL

## 2020-05-10 MED ORDER — SODIUM CHLORIDE 0.9 % IV SOLN
300.0000 mg | Freq: Once | INTRAVENOUS | Status: AC
  Administered 2020-05-10: 300 mg via INTRAVENOUS
  Filled 2020-05-10: qty 15

## 2020-05-10 MED ORDER — GENTAMICIN SULFATE 40 MG/ML IJ SOLN
320.0000 mg | INTRAVENOUS | Status: DC
  Filled 2020-05-10: qty 8

## 2020-05-10 MED ORDER — SODIUM CHLORIDE 0.9 % IV BOLUS
1500.0000 mL | Freq: Once | INTRAVENOUS | Status: AC
  Administered 2020-05-10 – 2020-05-11 (×2): 750 mL via INTRAVENOUS

## 2020-05-10 MED ORDER — GENTAMICIN SULFATE 40 MG/ML IJ SOLN
7.0000 mg/kg | INTRAVENOUS | Status: DC
  Administered 2020-05-10: 440 mg via INTRAVENOUS
  Filled 2020-05-10 (×2): qty 11

## 2020-05-10 MED ORDER — ENOXAPARIN SODIUM 100 MG/ML ~~LOC~~ SOLN
1.0000 mg/kg | Freq: Two times a day (BID) | SUBCUTANEOUS | Status: DC
  Administered 2020-05-11 – 2020-05-12 (×4): 85 mg via SUBCUTANEOUS
  Filled 2020-05-10 (×6): qty 1

## 2020-05-10 NOTE — Progress Notes (Signed)
Notified CNM McVey of pt temp 101.9 and hr of 144 via dinemap and 140 apical, she is currently in a delivery and unable to leave room at this time. Will notify backup [place].

## 2020-05-10 NOTE — Progress Notes (Signed)
Md notified of pharmacy question about lovenox. Gave MD pharmacy phone number and name

## 2020-05-10 NOTE — Progress Notes (Addendum)
Pharmacy Antibiotic Note  Tina Parrish is a 35 y.o. female admitted on 05/06/2020 with possible pyelonephritis vs endometritis/?PNA  Pharmacy has been consulted for gentamicin dosing. She received a single dose of gentamicin 130 mg at 0529 this morning. There is no evidence of renal impairment. She is postpartum day 2.  Plan: complete remainder of 7 mg/kg IV gentamicin dose using adjusted body weight of 64 kg (320 mg)  Obtain level 8 hours after gentamicin dose complete  Use [place] nomogram to confirm/modify dosage interval  03/26:  Natasha Bence level at 1950 on 3/25= 1.0 mcg/ml.  Dose given 3/25 at 1203.  Will continue Gentamicin 440 mg (7 mg/kg w/ adj.BW=63.5 kg)  IV q24h. Monitor Scr daily    Height: 5' 1.5" (156.2 cm) Weight: 85.3 kg (188 lb) IBW/kg (Calculated) : 48.95  Temp (24hrs), Avg:98.3 F (36.8 C), Min:97.9 F (36.6 C), Max:98.9 F (37.2 C)  Recent Labs  Lab 05/06/20 1309 05/08/20 0512 05/09/20 0104 05/09/20 0417 05/09/20 1029 05/09/20 1950 05/10/20 0518  WBC 12.1* 13.1* 28.0* 29.5* 28.4*  --  25.7*  CREATININE 0.56  --  0.71  --  0.61  --  0.60  LATICACIDVEN  --   --  1.7 1.5  --   --   --   GENTRANDOM  --   --   --   --   --  1.0  --     Estimated Creatinine Clearance: 101.2 mL/min (by C-G formula based on SCr of 0.6 mg/dL).    No Known Allergies  Antimicrobials this admission: ampicicillin 3/25 >>  clindamicin 3/25 >>  gentamicin 3/25 >>  Microbiology results: 3/25 BCx: NG 1 Day 3/24 UCx: pending 3/22 Grp B strep: negative 3/22 chlamydia, N gonorrhoeae: negative 3/22 RPR: reactive titer 1:1 (PMH of syphilis s/p treatment)   Thank you for allowing pharmacy to be a part of this patient's care.  Kongmeng Santoro A 05/10/2020 8:24 AM

## 2020-05-10 NOTE — Progress Notes (Signed)
Tina Anes, MD of findings. See new orders

## 2020-05-10 NOTE — Progress Notes (Signed)
Pharmacy Antibiotic Note  Tina Parrish is a 35 y.o. female admitted on 05/06/2020 with possible pyelonephritis vs endometritis/?PNA  Pharmacy has been consulted for gentamicin dosing. She received a single dose of gentamicin 130 mg at 0529 this morning. There is no evidence of renal impairment. She is postpartum day 2.  3/26 1342 - BCID from blood cultures. 1 of 4 vials (aerobic) grew GNR identified Serratia Marcescens. Currently pt is on Amp/clinda/gent. Gent @7mg /kg dose offers appropriate coverage and our antibiogram has ~97% susceptibility pattern for marcescens. No change necessary at this time, but can narrow according to further cultures, imaging, and clinical response. Per discussion with MD, will continue broad at present. Continue with plan below  Plan:  complete remainder of 7 mg/kg IV gentamicin dose using adjusted body weight of 64 kg (320 mg)  Obtain level 8 hours after gentamicin dose complete  Use [place] nomogram to confirm/modify dosage interval  03/26:  4/26 level at 1950 on 3/25= 1.0 mcg/ml.  Dose given 3/25 at 1203.  Will continue Gentamicin 440 mg (7 mg/kg w/ adj.BW=63.5 kg)  IV q24h. Monitor Scr daily    Height: 5' 1.5" (156.2 cm) Weight: 85.3 kg (188 lb) IBW/kg (Calculated) : 48.95  Temp (24hrs), Avg:98.3 F (36.8 C), Min:97.9 F (36.6 C), Max:98.9 F (37.2 C)  Recent Labs  Lab 05/06/20 1309 05/08/20 0512 05/09/20 0104 05/09/20 0417 05/09/20 1029 05/09/20 1950 05/10/20 0518  WBC 12.1* 13.1* 28.0* 29.5* 28.4*  --  25.7*  CREATININE 0.56  --  0.71  --  0.61  --  0.60  LATICACIDVEN  --   --  1.7 1.5  --   --   --   GENTRANDOM  --   --   --   --   --  1.0  --     Estimated Creatinine Clearance: 101.2 mL/min (by C-G formula based on SCr of 0.6 mg/dL).    No Known Allergies  Antimicrobials this admission: ampicicillin 3/25 >>  clindamicin 3/25 >>  gentamicin 3/25 >>  Microbiology results: 3/25 BCx: NG 1 Day 3/24 UCx: pending 3/22 Grp B  strep: negative 3/22 chlamydia, N gonorrhoeae: negative 3/22 RPR: reactive titer 1:1 (PMH of syphilis s/p treatment)   Thank you for allowing pharmacy to be a part of this patient's care.  4/22 Tina Parrish 05/10/2020 1:39 PM

## 2020-05-11 LAB — COMPREHENSIVE METABOLIC PANEL
ALT: 24 U/L (ref 0–44)
AST: 23 U/L (ref 15–41)
Albumin: 2.6 g/dL — ABNORMAL LOW (ref 3.5–5.0)
Alkaline Phosphatase: 100 U/L (ref 38–126)
Anion gap: 9 (ref 5–15)
BUN: 7 mg/dL (ref 6–20)
CO2: 21 mmol/L — ABNORMAL LOW (ref 22–32)
Calcium: 8.4 mg/dL — ABNORMAL LOW (ref 8.9–10.3)
Chloride: 109 mmol/L (ref 98–111)
Creatinine, Ser: 0.64 mg/dL (ref 0.44–1.00)
GFR, Estimated: >60 mL/min (ref >60)
Glucose, Bld: 76 mg/dL (ref 70–99)
Potassium: 3.3 mmol/L — ABNORMAL LOW (ref 3.5–5.1)
Sodium: 139 mmol/L (ref 135–145)
Total Bilirubin: 0.4 mg/dL (ref 0.3–1.2)
Total Protein: 6.4 g/dL — ABNORMAL LOW (ref 6.5–8.1)

## 2020-05-11 LAB — CBC
HCT: 24.2 % — ABNORMAL LOW (ref 36.0–46.0)
Hemoglobin: 7.9 g/dL — ABNORMAL LOW (ref 12.0–15.0)
MCH: 26.3 pg (ref 26.0–34.0)
MCHC: 32.6 g/dL (ref 30.0–36.0)
MCV: 80.7 fL (ref 80.0–100.0)
Platelets: 238 10*3/uL (ref 150–400)
RBC: 3 MIL/uL — ABNORMAL LOW (ref 3.87–5.11)
RDW: 15.8 % — ABNORMAL HIGH (ref 11.5–15.5)
WBC: 19.2 10*3/uL — ABNORMAL HIGH (ref 4.0–10.5)
nRBC: 0.2 % (ref 0.0–0.2)

## 2020-05-11 LAB — CREATININE, SERUM
Creatinine, Ser: 0.62 mg/dL (ref 0.44–1.00)
GFR, Estimated: >60 mL/min (ref >60)

## 2020-05-11 MED ORDER — SODIUM CHLORIDE 0.9 % IV SOLN
2.0000 g | Freq: Three times a day (TID) | INTRAVENOUS | Status: DC
  Administered 2020-05-11 – 2020-05-12 (×4): 2 g via INTRAVENOUS
  Filled 2020-05-11 (×7): qty 2

## 2020-05-11 MED ORDER — LABETALOL HCL 200 MG PO TABS
200.0000 mg | ORAL_TABLET | Freq: Two times a day (BID) | ORAL | Status: DC
  Administered 2020-05-11 – 2020-05-12 (×2): 200 mg via ORAL
  Filled 2020-05-11 (×2): qty 1

## 2020-05-11 NOTE — Consult Note (Signed)
[facility]  Total days of antibiotics 4               Reason for Consult: serratia pyelonephritis with secondary bacteremia     Referring Physician: schermerhorn  Principal Problem:   NSVD (normal spontaneous vaginal delivery)    HPI: Tina Parrish is a 35 y.o. female who is 4 days out from NSVD started to have fevers and leukocytosis roughly 2 days after delivery with marked leukocytosis from 13K to nearly 30K. She was started on amp/gent/clinda due to concern for endometritis but then patient continued to report abdominal discomfort. She underwent abd/pelvis CT given that she had ongoing fevers, which showed signs concerning for left sided pyelonephritis. Infectious work up of urine and blood cx showing serratia. Interestingly, she had ecoli/serratia uti during pregnancy.   Past Medical History:  Diagnosis Date  . Asthma   . Depression    Postpartum  . History of chlamydia 12/27/2019  . History of gonorrhea 12/27/2019  . Hypertension affecting pregnancy    chronic    Allergies: No Known Allergies   MEDICATIONS: . docusate sodium  100 mg Oral BID  . enoxaparin (LOVENOX) injection  1 mg/kg Subcutaneous Q12H  . ferrous sulfate  325 mg Oral BID WC  . ibuprofen  600 mg Oral Q6H  . NIFEdipine  30 mg Oral Daily  . prenatal multivitamin  1 tablet Oral Q1200  . Tdap  0.5 mL Intramuscular Once    Social History   Tobacco Use  . Smoking status: Current Some Day Smoker    Packs/day: 0.50    Types: Cigarettes  . Smokeless tobacco: Never Used  . Tobacco comment: denies exposure to secondhand smoke  Vaping Use  . Vaping Use: Never used  Substance Use Topics  . Alcohol use: Not Currently    Alcohol/week: 3.0 standard drinks    Types: 3 Shots of liquor per week    Comment: q weekend  . Drug use: Not Currently    Types: Cocaine, Marijuana    Comment: last cocaine 12/07/18; last MJ 12/06/18    Family History  Problem Relation Age of Onset   . Hypertension Mother   . Diabetes Mother   . Hypertension Brother   . Asthma Brother   . Asthma Son   . Diabetes Maternal Grandmother   . Hypertension Maternal Grandmother   . Diabetes Maternal Grandfather   . Diabetes Maternal Aunt       OBJECTIVE: Temp:  [97.7 F (36.5 C)-101.9 F (38.8 C)] 97.7 F (36.5 C) (03/27 1202) Pulse Rate:  [114-144] 116 (03/27 1202) Resp:  [18-20] 18 (03/27 1202) BP: (125-149)/(82-102) 131/98 (03/27 1202) SpO2:  [98 %-100 %] 100 % (03/27 1202)   LABS: Results for orders placed or performed during the hospital encounter of 05/06/20 (from the past 48 hour(s))  Gentamicin level, random     Status: None   Collection Time: 05/09/20  7:50 PM  Result Value Ref Range   Gentamicin Rm 1.0 ug/mL    Comment:        Random Gentamicin therapeutic range is dependent on dosage and time of specimen collection. A peak range is 5.0-10.0 ug/mL A trough range is 0.5-2.0 ug/mL        Performed at [facility], [address] [place], [place] [address]   Comprehensive metabolic panel     Status: Abnormal   Collection Time: 05/10/20  5:18 AM  Result Value Ref Range   Sodium  139 135 - 145 mmol/L   Potassium 3.3 (L) 3.5 - 5.1 mmol/L   Chloride 110 98 - 111 mmol/L   CO2 21 (L) 22 - 32 mmol/L   Glucose, Bld 57 (L) 70 - 99 mg/dL    Comment: Glucose reference range applies only to samples taken after fasting for at least 8 hours.   BUN 7 6 - 20 mg/dL   Creatinine, Ser 6.54 0.44 - 1.00 mg/dL   Calcium 8.4 (L) 8.9 - 10.3 mg/dL   Total Protein 6.3 (L) 6.5 - 8.1 g/dL   Albumin 2.5 (L) 3.5 - 5.0 g/dL   AST 23 15 - 41 U/L   ALT 19 0 - 44 U/L   Alkaline Phosphatase 107 38 - 126 U/L   Total Bilirubin 0.5 0.3 - 1.2 mg/dL   GFR, Estimated >65 >03 mL/min    Comment: (NOTE) Calculated using the CKD-EPI Creatinine Equation (2021)    Anion gap 8 5 - 15    Comment: Performed at [facility], [address] [place], [place] [address]  CBC      Status: Abnormal   Collection Time: 05/10/20  5:18 AM  Result Value Ref Range   WBC 25.7 (H) 4.0 - 10.5 K/uL   RBC 3.14 (L) 3.87 - 5.11 MIL/uL   Hemoglobin 8.2 (L) 12.0 - 15.0 g/dL   HCT 81.2 (L) 75.1 - 70.0 %   MCV 80.3 80.0 - 100.0 fL   MCH 26.1 26.0 - 34.0 pg   MCHC 32.5 30.0 - 36.0 g/dL   RDW 17.4 (H) 94.4 - 96.7 %   Platelets 196 150 - 400 K/uL   nRBC 0.0 0.0 - 0.2 %    Comment: Performed at [facility], [address] [place], [place] [address]  Creatinine, serum     Status: None   Collection Time: 05/11/20  6:06 AM  Result Value Ref Range   Creatinine, Ser 0.62 0.44 - 1.00 mg/dL   GFR, Estimated >84 >66 mL/min    Comment: (NOTE) Calculated using the CKD-EPI Creatinine Equation (2021) Performed at [facility], [address] [place], [place] [address]   CBC     Status: Abnormal   Collection Time: 05/11/20 10:26 AM  Result Value Ref Range   WBC 19.2 (H) 4.0 - 10.5 K/uL   RBC 3.00 (L) 3.87 - 5.11 MIL/uL   Hemoglobin 7.9 (L) 12.0 - 15.0 g/dL   HCT 70.1 (L) 77.9 - 39.0 %   MCV 80.7 80.0 - 100.0 fL   MCH 26.3 26.0 - 34.0 pg   MCHC 32.6 30.0 - 36.0 g/dL   RDW 30.0 (H) 92.3 - 30.0 %   Platelets 238 150 - 400 K/uL   nRBC 0.2 0.0 - 0.2 %    Comment: Performed at [facility], [address] [place], [place] [address]  Comprehensive metabolic panel     Status: Abnormal   Collection Time: 05/11/20 10:26 AM  Result Value Ref Range   Sodium 139 135 - 145 mmol/L   Potassium 3.3 (L) 3.5 - 5.1 mmol/L   Chloride 109 98 - 111 mmol/L   CO2 21 (L) 22 - 32 mmol/L   Glucose, Bld 76 70 - 99 mg/dL    Comment: Glucose reference range applies only to samples taken after fasting for at least 8 hours.   BUN 7 6 - 20 mg/dL   Creatinine, Ser 3.33 0.44 - 1.00 mg/dL   Calcium 8.4 (L) 8.9 - 10.3 mg/dL  Total Protein 6.4 (L) 6.5 - 8.1 g/dL   Albumin 2.6 (L) 3.5 - 5.0 g/dL   AST 23 15 - 41 U/L   ALT 24 0 - 44 U/L   Alkaline Phosphatase 100 38 - 126 U/L    Total Bilirubin 0.4 0.3 - 1.2 mg/dL   GFR, Estimated >16 >10 mL/min    Comment: (NOTE) Calculated using the CKD-EPI Creatinine Equation (2021)    Anion gap 9 5 - 15    Comment: Performed at [facility], [address] [place], [place] [address]    MICRO:  3/25 blood cx serratia 3/24 urine cx serratia (70,000 col)  IMAGING: IMPRESSION: 1. Diffusely inhomogeneous enhancement of the left kidney, an appearance consistent with acute bacterial nephritis/pyelonephritis. Left kidney subtly edematous. No frank abscess apparent.  2. 1 mm nonobstructing calculus mid left kidney. No hydronephrosis or ureteral calculus on either side. Urinary bladder wall thickness normal.  3. Diffuse stool throughout much of colon. Suspect a degree of constipation.  4.  Enlarged uterus consistent with recent postpartum state.  5. Small left pleural effusion with left base atelectasis. Suspect early developing pneumonia posterior left base. HISTORICAL MICRO/IMAGING Hx of serratia uti during pregnancy  Assessment/Plan: 35yo F with post-partum fever found to have left sided pyelonephritis with secondary bacteremia with serratia marescens  - continue on cefepime 2gm Iv Q 8hr - awaiting sensitivities on culture results to see what antimicrobial agents can be used to treat her infection - ideally would like her to have 24hr of being afebrile and response to treatment   Dr Rivka Safer to follow up tomorrow and do a formal consult  Aram Beecham B. Drue Second MD MPH [facility] [phone]

## 2020-05-11 NOTE — Progress Notes (Signed)
Post Partum Day 5 Subjective:  Tolerating regular diet, pain with PO meds, voiding and ambulating without difficulty. Passing gas and had BM yesterday. Reports scant bleeding and no fundal tenderness. "feeling much better" No abdominal pain reported.   No CP SOB, Chills, N/V or leg pain; denies nipple or breast pain, no HA change of vision, RUQ/epigastric pain  Objective: BP (!) 130/91 (BP Location: Left Arm)   Pulse (!) 114   Temp 98 F (36.7 C)   Resp 18   Ht 5' 1.5" (1.562 m)   Wt 85.3 kg   LMP 07/27/2019 (Approximate)   SpO2 98%   Breastfeeding Unknown   BMI 34.95 kg/m  VS review: T max 101.9 at 2130 last night with tachycardia to 140s, currently afebrile and pulse110   Physical Exam:  General: NAD Breasts: soft/nontender CV: RRR Pulm: nl effort, CTABL No abdominal tenderness to palp, left mild CVA tenderness. None on right.  Perineum: intact Lochia: scant Uterine Fundus: fundus firm, nontender and 3 fb below umbilicus DVT Evaluation: no cords, ttp LEs   Recent Labs    05/10/20 0518 05/11/20 1026  HGB 8.2* 7.9*  HCT 25.2* 24.2*  WBC 25.7* 19.2*  PLT 196 238    Component     Latest Ref Rng & Units 05/06/2020 05/08/2020 05/09/2020 05/09/2020           1:04 AM  4:17 AM  WBC     4.0 - 10.5 K/uL 12.1 (H) 13.1 (H) 28.0 (H) 29.5 (H)   Component     Latest Ref Rng & Units 05/09/2020 05/10/2020 05/11/2020        10:29 AM    WBC     4.0 - 10.5 K/uL 28.4 (H) 25.7 (H) 19.2 (H)    Assessment/Plan: 35 y.o. [id number] postpartum day # 5  - Continue routine PP care - encouraged snug fitting bra and cabbage leaves for bottlefeeding.   - Sepsis- continue Amp/Gent and clinda. Reviewed labs with [facility]- infectious disease consult requested to clarify best abx therapy. Blood and urine culture positive for serratia. WBC trending back down.   - suspected thrombophlebitis per Dr Dalbert Garnet after exquisite pain from IV site, on therapeutic dose of Lovenox.   - Acute blood loss anemia -  hemodynamically stable and asymptomatic; start po ferrous sulfate BID with stool softeners. Given IV iron x 1 yesterday.     Disposition: Does not desire Dc home today.     Randa Ngo, CNM 05/11/2020  11:11 AM

## 2020-05-12 DIAGNOSIS — O8620 Urinary tract infection following delivery, unspecified: Secondary | ICD-10-CM

## 2020-05-12 DIAGNOSIS — N390 Urinary tract infection, site not specified: Secondary | ICD-10-CM

## 2020-05-12 DIAGNOSIS — R7881 Bacteremia: Secondary | ICD-10-CM

## 2020-05-12 DIAGNOSIS — Z1623 Resistance to quinolones and fluoroquinolones: Secondary | ICD-10-CM

## 2020-05-12 DIAGNOSIS — B9689 Other specified bacterial agents as the cause of diseases classified elsewhere: Secondary | ICD-10-CM

## 2020-05-12 LAB — CULTURE, BLOOD (ROUTINE X 2): Special Requests: ADEQUATE

## 2020-05-12 LAB — CBC
HCT: 26 % — ABNORMAL LOW (ref 36.0–46.0)
Hemoglobin: 8.4 g/dL — ABNORMAL LOW (ref 12.0–15.0)
MCH: 25.8 pg — ABNORMAL LOW (ref 26.0–34.0)
MCHC: 32.3 g/dL (ref 30.0–36.0)
MCV: 80 fL (ref 80.0–100.0)
Platelets: 295 10*3/uL (ref 150–400)
RBC: 3.25 MIL/uL — ABNORMAL LOW (ref 3.87–5.11)
RDW: 15.8 % — ABNORMAL HIGH (ref 11.5–15.5)
WBC: 15.9 10*3/uL — ABNORMAL HIGH (ref 4.0–10.5)
nRBC: 0.2 % (ref 0.0–0.2)

## 2020-05-12 LAB — URINE CULTURE
Culture: 70000 — AB
Special Requests: NORMAL

## 2020-05-12 LAB — CREATININE, SERUM
Creatinine, Ser: 0.72 mg/dL (ref 0.44–1.00)
GFR, Estimated: >60 mL/min (ref >60)

## 2020-05-12 LAB — SURGICAL PATHOLOGY

## 2020-05-12 MED ORDER — CIPROFLOXACIN HCL 500 MG PO TABS: 500.0000 mg | ORAL_TABLET | Freq: Two times a day (BID) | ORAL | 0 refills | Status: AC

## 2020-05-12 MED ORDER — NIFEDIPINE ER 30 MG PO TB24: 30.0000 mg | ORAL_TABLET | Freq: Every day | ORAL | 0 refills | Status: DC

## 2020-05-12 MED ORDER — CIPROFLOXACIN HCL 500 MG PO TABS
500.0000 mg | ORAL_TABLET | Freq: Two times a day (BID) | ORAL | Status: DC
  Administered 2020-05-12: 500 mg via ORAL
  Filled 2020-05-12 (×2): qty 1

## 2020-05-12 MED ORDER — FERROUS SULFATE 325 (65 FE) MG PO TABS: 325.0000 mg | ORAL_TABLET | Freq: Two times a day (BID) | ORAL | 3 refills | Status: DC

## 2020-05-12 MED ORDER — IBUPROFEN 600 MG PO TABS
600.0000 mg | ORAL_TABLET | Freq: Four times a day (QID) | ORAL | 0 refills | Status: DC | PRN
Start: 2020-05-12 — End: 2021-06-28

## 2020-05-12 MED ORDER — LABETALOL HCL 200 MG PO TABS: 200.0000 mg | ORAL_TABLET | Freq: Two times a day (BID) | ORAL | 1 refills | Status: DC

## 2020-05-12 NOTE — Progress Notes (Signed)
Pt discharged with infant.  Discharge instructions, prescriptions and follow up appointment given to and reviewed with pt. Pt verbalized understanding. Escorted out by auxillary. 

## 2020-05-12 NOTE — Discharge Instructions (Signed)
Ciprofloxacin tablets What is this medicine? CIPROFLOXACIN (sip roe FLOX a sin) is a quinolone antibiotic. It is used to treat certain kinds of bacterial infections. It will not work for colds, flu, or other viral infections. This medicine may be used for other purposes; ask your health care provider or pharmacist if you have questions. COMMON BRAND NAME(S): Cipro What should I tell my health care provider before I take this medicine? They need to know if you have any of these conditions:  bone problems  diabetes  heart disease  high blood pressure  history of irregular heartbeat  history of low levels of potassium in the blood  joint problems  kidney disease  liver disease  mental illness  myasthenia gravis  seizures  tendon problems  tingling of the fingers or toes, or other nerve disorder  an unusual or allergic reaction to ciprofloxacin, other antibiotics or medicines, foods, dyes, or preservatives  pregnant or trying to get pregnant  breast-feeding How should I use this medicine? Take this medicine by mouth with a full glass of water. Follow the directions on the prescription label. You can take it with or without food. If it upsets your stomach, take it with food. Take your medicine at regular intervals. Do not take your medicine more often than directed. Take all of your medicine as directed even if you think you are better. Do not skip doses or stop your medicine early. Avoid antacids, aluminum, calcium, iron, magnesium, and zinc products for 6 hours before and 2 hours after taking a dose of this medicine. A special MedGuide will be given to you by the pharmacist with each prescription and refill. Be sure to read this information carefully each time. Talk to your pediatrician regarding the use of this medicine in children. Special care may be needed. Overdosage: If you think you have taken too much of this medicine contact a poison control center or emergency  room at once. NOTE: This medicine is only for you. Do not share this medicine with others. What if I miss a dose? If you miss a dose, take it as soon as you can. If it is almost time for your next dose, take only that dose. Do not take double or extra doses. What may interact with this medicine? Do not take this medicine with any of the following medications:  cisapride  dronedarone  flibanserin  lomitapide  pimozide  thioridazine  tizanidine This medicine may also interact with the following medications:  antacids  birth control pills  caffeine  certain medicines for diabetes, like glipizide, glyburide, or insulin  certain medicines that treat or prevent blood clots like warfarin  clozapine  cyclosporine  didanosine buffered tablets or powder  dofetilide  duloxetine  lanthanum carbonate  lidocaine  methotrexate  multivitamins  NSAIDS, medicines for pain and inflammation, like ibuprofen or naproxen  olanzapine  omeprazole  other medicines that prolong the QT interval (cause an abnormal heart rhythm)  phenytoin  probenecid  ropinirole  sevelamer  sildenafil  sucralfate  theophylline  ziprasidone  zolpidem This list may not describe all possible interactions. Give your health care provider a list of all the medicines, herbs, non-prescription drugs, or dietary supplements you use. Also tell them if you smoke, drink alcohol, or use illegal drugs. Some items may interact with your medicine. What should I watch for while using this medicine? Tell your doctor or health care provider if your symptoms do not start to get better or if they get worse.  This medicine may cause serious skin reactions. They can happen weeks to months after starting the medicine. Contact your health care provider right away if you notice fevers or flu-like symptoms with a rash. The rash may be red or purple and then turn into blisters or peeling of the skin. Or, you might  notice a red rash with swelling of the face, lips or lymph nodes in your neck or under your arms. Do not treat diarrhea with over the counter products. Contact your doctor if you have diarrhea that lasts more than 2 days or if it is severe and watery. Check with your doctor or health care provider if you get an attack of severe diarrhea, nausea and vomiting, or if you sweat a lot. The loss of too much body fluid can make it dangerous for you to take this medicine. This medicine may increase blood sugar. Ask your health care provider if changes in diet or medicines are needed if you have diabetes. You may get drowsy or dizzy. Do not drive, use machinery, or do anything that needs mental alertness until you know how this medicine affects you. Do not sit or stand up quickly, especially if you are an older patient. This reduces the risk of dizzy or fainting spells. This medicine can make you more sensitive to the sun. Keep out of the sun. If you cannot avoid being in the sun, wear protective clothing and use sunscreen. Do not use sun lamps or tanning beds/booths. What side effects may I notice from receiving this medicine? Side effects that you should report to your doctor or health care professional as soon as possible:  allergic reactions like skin rash or hives, swelling of the face, lips, or tongue  anxious  bloody or watery diarrhea  confusion  depressed mood  fast, irregular heartbeat  fever  hallucination, loss of contact with reality  joint, muscle, or tendon pain or swelling  loss of memory  pain, tingling, numbness in the hands or feet  redness, blistering, peeling or loosening of the skin, including inside the mouth  seizures  signs and symptoms of aortic dissection such as sudden chest, stomach, or back pain  signs and symptoms of high blood sugar such as being more thirsty or hungry or having to urinate more than normal. You may also feel very tired or have blurry  vision.  signs and symptoms of liver injury like dark yellow or brown urine; general ill feeling or flu-like symptoms; light-colored stools; loss of appetite; nausea; right upper belly pain; unusually weak or tired; yellowing of the eyes or skin  signs and symptoms of low blood sugar such as feeling anxious; confusion; dizziness; increased hunger; unusually weak or tired; sweating; shakiness; cold; irritable; headache; blurred vision; fast heartbeat; loss of consciousness; pale skin  suicidal thoughts or other mood changes  sunburn  unusually weak or tired Side effects that usually do not require medical attention (report to your doctor or health care professional if they continue or are bothersome):  dry mouth  headache  nausea  trouble sleeping This list may not describe all possible side effects. Call your doctor for medical advice about side effects. You may report side effects to FDA at 1-800-FDA-1088. Where should I keep my medicine? Keep out of the reach of children. Store at room temperature below 30 degrees C (86 degrees F). Keep container tightly closed. Throw away any unused medicine after the expiration date. NOTE: This sheet is a summary. It may not cover  all possible information. If you have questions about this medicine, talk to your doctor, pharmacist, or health care provider.  2021 Elsevier/Gold Standard (2018-05-04 11:26:08) Pyelonephritis During Pregnancy  What are the causes? This condition is caused by a bacterial infection in the lower urinary tract that spreads to the kidney. What increases the risk? You are more likely to develop this condition if:  You have diabetes.  You have a history of frequent urinary tract infections. What are the signs or symptoms? Symptoms of this condition may begin with symptoms of a lower urinary tract infection. These may include:  A frequent urge to pass urine.  Burning pain when passing urine.  Pain and pressure in  your lower abdomen.  Blood in your urine.  Cloudy or smelly urine. As the infection spreads to your kidney, you may have these symptoms:  Fever.  Chills.  Pain and tenderness in your upper abdomen or in your back and sides (flank pain). Flank pain often affects one side of the body, usually the right side.  Nausea, vomiting, or loss of appetite. How is this diagnosed? This condition may be diagnosed based on:  Your symptoms and medical history.  A physical exam.  Tests to confirm the diagnosis. These may include: ? Blood tests to check kidney function and look for signs of infection. ? Urine tests to check for signs of infection, including bacteria, white or red blood cells, and protein. ? Tests to grow and identify the type of bacteria that is causing the infection (urine culture). ? Imaging studies of your kidneys to learn more about your condition. How is this treated? This condition is treated in the hospital with antibiotics that are given into one of your veins through an IV. Your health care provider:  Will start you on an antibiotic that is effective against common urinary tract infections.  May switch to another antibiotic if the results of your urine culture show that your infection is caused by different bacteria.  Will be careful to choose antibiotics that are the safest during pregnancy. Other treatments may include:  IV fluids if you are nauseous and not able to drink fluids.  Pain and fever medicines.  Medicines for nausea and vomiting. You will be able to go home when your infection is under control. To prevent another infection, you may need to continue taking antibiotics by mouth until your baby is born. Follow these instructions at home: Medicines  Take over-the-counter and prescription medicines only as told by your health care provider.  Take your antibiotic medicine as told by your health care provider. Do not stop taking the antibiotic even if you  start to feel better.  Continue to take your prenatal vitamins.   Lifestyle  Follow instructions from your health care provider about eating or drinking restrictions. You may need to avoid: ? Foods and drinks with added sugar. ? Caffeine and fruit juice.  Drink enough fluid to keep your urine pale yellow.  Go to the bathroom frequently. Do not hold your urine. Try to empty your bladder completely.  Change your underwear every day. Wear all-cotton underwear. Do not wear tight underwear or pants.   General instructions  Take these steps to lower the risk of bacteria getting into your urinary tract: ? Use liquid soap instead of bar soap when showering or bathing. Bacteria can grow on bar soap. ? When you wash yourself, clean the urethra opening first. Use a washcloth to clean the area between your vagina and anus.  Pat the area dry with a clean towel. ? Wash your hands before and after you go to the bathroom. ? Wipe yourself from front to back after going to the bathroom. ? Do not use douches, perfumed soap, creams, or powders. ? Do not soak in a bath for more than 30 minutes.  Return to your normal activities as told by your health care provider. Ask your health care provider what activities are safe for you.  Keep all follow-up visits as told by your health care provider. This is important.   Contact a health care provider if:  You have chills or a fever.  You have any symptoms of infection that do not get better at home.  Symptoms of infection come back.  You have a reaction or side effects from your antibiotic. Get help right away if:  You start having contractions. Summary  Pyelonephritis is an infection of the kidney or kidneys.  This condition results when a bacterial infection in the lower urinary tract spreads to the kidney.  Lower urinary tract infections are common during pregnancy.  Pyelonephritis causes chills, a fever, flank pain, and nausea.  Pyelonephritis  is a serious infection that is usually treated in the hospital with IV antibiotics. This information is not intended to replace advice given to you by your health care provider. Make sure you discuss any questions you have with your health care provider. Document Revised: 05/26/2018 Document Reviewed: 05/05/2017 Elsevier Patient Education  2021 [employer] Postpartum Care After Vaginal Delivery The following information offers guidance about how to care for yourself from the time you deliver your baby to 6-12 weeks after delivery (postpartum period). If you have problems or questions, contact your health care provider for more specific instructions. Follow these instructions at home: Vaginal bleeding  It is normal to have vaginal bleeding (lochia) after delivery. Wear a sanitary pad for bleeding and discharge. ? During the first week after delivery, the amount and appearance of lochia is often similar to a menstrual period. ? Over the next few weeks, it will gradually decrease to a dry, yellow-brown discharge. ? For most women, lochia stops completely by 4-6 weeks after delivery, but can vary.  Change your sanitary pads frequently. Watch for any changes in your flow, such as: ? A sudden increase in volume. ? A change in color. ? Large blood clots.  If you pass a blood clot from your vagina, save it and call your health care provider. Do not flush blood clots down the toilet before talking with your health care provider.  Do not use tampons or douches until your health care provider approves.  If you are not breastfeeding, your period should return 6-8 weeks after delivery. If you are feeding your baby breast milk only, your period may not return until you stop breastfeeding. Perineal care  Keep the area between the vagina and the anus (perineum) clean and dry. Use medicated pads and pain-relieving sprays and creams as directed.  If you had a surgical cut in the perineum (episiotomy) or  a tear, check the area for signs of infection until you are healed. Check for: ? More redness, swelling, or pain. ? Fluid or blood coming from the cut or tear. ? Warmth. ? Pus or a bad smell.  You may be given a squirt bottle to use instead of wiping to clean the perineum area after you use the bathroom. Pat the area gently to dry it.  To relieve pain caused by an episiotomy,  a tear, or swollen veins in the anus (hemorrhoids), take a warm sitz bath 2-3 times a day. In a sitz bath, the warm water should only come up to your hips and cover your buttocks.   Breast care  In the first few days after delivery, your breasts may feel heavy, full, and uncomfortable (breast engorgement). Milk may also leak from your breasts. Ask your health care provider about ways to help relieve the discomfort.  If you are breastfeeding: ? Wear a bra that supports your breasts and fits well. Use breast pads to absorb milk that leaks. ? Keep your nipples clean and dry. Apply creams and ointments as told. ? You may have uterine contractions every time you breastfeed for up to several weeks after delivery. This helps your uterus return to its normal size. ? If you have any problems with breastfeeding, notify your health care provider or lactation consultant.  If you are not breastfeeding: ? Avoid touching your breasts. Do not squeeze out (express) milk. Doing this can make your breasts produce more milk. ? Wear a good-fitting bra and use cold packs to help with swelling. Intimacy and sexuality  Ask your health care provider when you can engage in sexual activity. This may depend upon: ? Your risk of infection. ? How fast you are healing. ? Your comfort and desire to engage in sexual activity.  You are able to get pregnant after delivery, even if you have not had your period. Talk with your health care provider about methods of birth control (contraception) or family planning if you desire future  pregnancies. Medicines  Take over-the-counter and prescription medicines only as told by your health care provider.  Take an over-the-counter stool softener to help ease bowel movements as told by your health care provider.  If you were prescribed an antibiotic medicine, take it as told by your health care provider. Do not stop taking the antibiotic even if you start to feel better.  Review all previous and current prescriptions to check for possible transfer into breast milk. Activity  Gradually return to your normal activities as told by your health care provider.  Rest as much as possible. Nap while your baby is sleeping. Eating and drinking  Drink enough fluid to keep your urine pale yellow.  To help prevent or relieve constipation, eat high-fiber foods every day.  Choose healthy eating to support breastfeeding or weight loss goals.  Take your prenatal vitamins until your health care provider tells you to stop.   General tips/recommendations  Do not use any products that contain nicotine or tobacco. These products include cigarettes, chewing tobacco, and vaping devices, such as e-cigarettes. If you need help quitting, ask your health care provider.  Do not drink alcohol, especially if you are breastfeeding.  Do not take medications or drugs that are not prescribed to you, especially if you are breastfeeding.  Visit your health care provider for a postpartum checkup within the first 3-6 weeks after delivery.  Complete a comprehensive postpartum visit no later than 12 weeks after delivery.  Keep all follow-up visits for you and your baby. Contact a health care provider if:  You feel unusually sad or worried.  Your breasts become red, painful, or hard.  You have a fever or other signs of an infection.  You have bleeding that is soaking through one pad an hour or you have blood clots.  You have a severe headache that doesn't go away or you have vision changes.  You  have nausea and vomiting and are unable to eat or drink anything for 24 hours. Get help right away if:  You have chest pain or difficulty breathing.  You have sudden, severe leg pain.  You faint or have a seizure.  You have thoughts about hurting yourself or your baby. If you ever feel like you may hurt yourself or others, or have thoughts about taking your own life, get help right away. Go to your nearest emergency department or:  Call your local emergency services (911 in the [place]  The National Suicide Prevention Lifeline at 601-590-0268. This suicide crisis helpline is open 24 hours a day.  Text the Crisis Text Line at 561-601-5998 (in the [place] Summary  The period of time after you deliver your newborn up to 6-12 weeks after delivery is called the postpartum period.  Keep all follow-up visits for you and your baby.  Review all previous and current prescriptions to check for possible transfer into breast milk.  Contact a health care provider if you feel unusually sad or worried during the postpartum period. This information is not intended to replace advice given to you by your health care provider. Make sure you discuss any questions you have with your health care provider. Document Revised: 10/18/2019 Document Reviewed: 10/18/2019 Elsevier Patient Education  2021 [employer].

## 2020-05-12 NOTE — Consult Note (Signed)
NAME: Tina Parrish  DOB: Jun 09, 1985  MRN: [id number]  Date/Time: 05/12/2020 12:41 PM  REQUESTING PROVIDER: Dr. Feliberto Gottron Subjective:  REASON FOR CONSULT: Bacteremia ? Tina Parrish is a 35 y.o. female with  nephrolithiasis, diagnosed when [redacted] weeks pregnant but managed conservatively then , has had serratia and e.coli in the urine since sept 2021, had NSVD on 05/07/20. She started having worsening left sided pain post partum and was tachycardic and febrile. WBC increased from 13K to 30K Concern for pyelonephritis VS endometritis- started on amp  gen and clinda after cultures were sent. CT abdomen showed left sided pyelonephritis Blood culture was positive fpr serratia, and urine had serratia as well- switched to cefepime by weekend ID on call and I am seeing patient for same. Pt is feeling better No left sided flank pain  No fever No nausea or vomiting She is not going to breast feed she says  Past Medical History:  Diagnosis Date  . Asthma   . Depression    Postpartum  . History of chlamydia 12/27/2019  . History of gonorrhea 12/27/2019  . 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 Her female partner then had secondary syphilis Her female partner also got treated  Past Surgical History:  Procedure Laterality Date  . denies surgical history    . NO PAST SURGERIES      Social History   Socioeconomic History  . Marital status: Single    Spouse name: Not on file  . Number of children: 3  . Years of education: 10  . Highest education level: Not on file  Occupational History    Comment: Homemaker  Tobacco Use  . Smoking status: Current Some Day Smoker    Packs/day: 0.50    Types: Cigarettes  . Smokeless tobacco: Never Used  . Tobacco comment: denies exposure to secondhand smoke  Vaping Use  . Vaping Use: Never used  Substance and Sexual Activity  . Alcohol use: Not Currently    Alcohol/week: 3.0 standard drinks     Types: 3 Shots of liquor per week    Comment: q weekend  . Drug use: Not Currently    Types: Cocaine, Marijuana    Comment: last cocaine 12/07/18; last MJ 12/06/18  . Sexual activity: Yes    Partners: Female, Female  Other Topics Concern  . Not on file  Social History Narrative  . Not on file   Social Determinants of Health   Financial Resource Strain: Low Risk   . Difficulty of Paying Living Expenses: Not hard at all  Food Insecurity: No Food Insecurity  . Worried About Programme researcher, broadcasting/film/video in the Last Year: Never true  . Ran Out of Food in the Last Year: Never true  Transportation Needs: Unmet Transportation Needs  . Lack of Transportation (Medical): Yes  . Lack of Transportation (Non-Medical): Yes  Physical Activity: Not on file  Stress: Not on file  Social Connections: Not on file  Intimate Partner Violence: Not At Risk  . Fear of Current or Ex-Partner: No  . Emotionally Abused: No  . Physically Abused: No  . Sexually Abused: No    Family History  Problem Relation Age of Onset  . Hypertension Mother   . Diabetes Mother   . Hypertension Brother   . Asthma Brother   . Asthma Son   . Diabetes Maternal Grandmother   . Hypertension Maternal Grandmother   . Diabetes Maternal Grandfather   .  Diabetes Maternal Aunt    No Known Allergies I? Current Facility-Administered Medications  Medication Dose Route Frequency Provider Last Rate Last Admin  . 0.9 %  sodium chloride infusion   Intravenous Continuous McVey, Rebecca A, CNM 50 mL/hr at 05/12/20 0412 New Bag at 05/12/20 0412  . acetaminophen (TYLENOL) tablet 650 mg  650 mg Oral Q4H PRN Haroldine Laws, CNM   650 mg at 05/11/20 1734  . benzocaine-Menthol (DERMOPLAST) 20-0.5 % topical spray 1 application  1 application Topical PRN Haroldine Laws, CNM   1 application at 05/07/20 2028  . ceFEPIme (MAXIPIME) 2 g in sodium chloride 0.9 % 100 mL IVPB  2 g Intravenous Q8H McVey, Rebecca A, CNM 200 mL/hr at 05/12/20 0538 2 g at  05/12/20 0538  . chewing gum (ORBIT) sugar free  1 Stick Oral PRN Christeen Douglas, MD      . coconut oil  1 application Topical PRN Haroldine Laws, CNM      . witch hazel-glycerin (TUCKS) pad 1 application  1 application Topical PRN Haroldine Laws, CNM       And  . dibucaine (NUPERCAINAL) 1 % rectal ointment 1 application  1 application Rectal PRN Haroldine Laws, CNM      . diphenhydrAMINE (BENADRYL) capsule 25 mg  25 mg Oral Q6H PRN Haroldine Laws, CNM      . docusate sodium (COLACE) capsule 100 mg  100 mg Oral BID Haroldine Laws, CNM   100 mg at 05/12/20 1044  . enoxaparin (LOVENOX) injection 85 mg  1 mg/kg Subcutaneous Q12H Christeen Douglas, MD   85 mg at 05/12/20 1045  . ferrous sulfate tablet 325 mg  325 mg Oral BID WC Haroldine Laws, CNM   325 mg at 05/12/20 0818  . ibuprofen (ADVIL) tablet 600 mg  600 mg Oral Q6H Haroldine Laws, CNM   600 mg at 05/12/20 1224  . labetalol (NORMODYNE) tablet 200 mg  200 mg Oral BID McVey, Rebecca A, CNM   200 mg at 05/12/20 1044  . lactated ringers bolus 1,000 mL  1,000 mL Intravenous Once Christeen Douglas, MD      . NIFEdipine (PROCARDIA-XL/NIFEDICAL-XL) 24 hr tablet 30 mg  30 mg Oral Daily Haroldine Laws, CNM   30 mg at 05/12/20 1044  . ondansetron (ZOFRAN) tablet 4 mg  4 mg Oral Q4H PRN Haroldine Laws, CNM       Or  . ondansetron (ZOFRAN) injection 4 mg  4 mg Intravenous Q4H PRN Haroldine Laws, CNM      . oxyCODONE (Oxy IR/ROXICODONE) immediate release tablet 5 mg  5 mg Oral Q4H PRN Christeen Douglas, MD   5 mg at 05/10/20 2121  . prenatal multivitamin tablet 1 tablet  1 tablet Oral Q1200 Haroldine Laws, CNM   1 tablet at 05/12/20 1224  . simethicone (MYLICON) chewable tablet 80 mg  80 mg Oral PRN Haroldine Laws, CNM      . Tdap (BOOSTRIX) injection 0.5 mL  0.5 mL Intramuscular Once Haroldine Laws, CNM      . traMADol (ULTRAM) tablet 50 mg  50 mg Oral Q6H PRN Christeen Douglas, MD         Abtx:  Anti-infectives (From admission, onward)    Start     Dose/Rate Route Frequency Ordered Stop   05/11/20 1300  ceFEPIme (MAXIPIME) 2 g in sodium chloride 0.9 % 100 mL IVPB        2 g 200 mL/hr over 30 Minutes Intravenous Every 8 hours 05/11/20 1152  05/10/20 1200  gentamicin (GARAMYCIN) 320 mg in dextrose 5 % 100 mL IVPB  Status:  Discontinued        320 mg 108 mL/hr over 60 Minutes Intravenous Every 24 hours 05/10/20 0833 05/10/20 0852   05/10/20 1200  gentamicin (GARAMYCIN) 440 mg in dextrose 5 % 100 mL IVPB  Status:  Discontinued        7 mg/kg  63.5 kg (Adjusted) 111 mL/hr over 60 Minutes Intravenous Every 24 hours 05/10/20 0852 05/11/20 1152   05/10/20 0300  ampicillin (OMNIPEN) 2 g in sodium chloride 0.9 % 100 mL IVPB  Status:  Discontinued        2 g 300 mL/hr over 20 Minutes Intravenous Every 6 hours 05/09/20 2135 05/11/20 1152   05/09/20 1400  clindamycin (CLEOCIN) IVPB 900 mg  Status:  Discontinued        900 mg 100 mL/hr over 30 Minutes Intravenous Every 8 hours 05/09/20 0954 05/11/20 1152   05/09/20 1100  gentamicin (GARAMYCIN) 320 mg in dextrose 5 % 100 mL IVPB        320 mg 108 mL/hr over 60 Minutes Intravenous  Once 05/09/20 1024 05/09/20 1303   05/09/20 1045  gentamicin (GARAMYCIN) 430 mg in dextrose 5 % 100 mL IVPB  Status:  Discontinued        5 mg/kg  85.3 kg 110.8 mL/hr over 60 Minutes Intravenous Every 24 hours 05/09/20 0954 05/09/20 1024   05/09/20 0600  clindamycin (CLEOCIN) IVPB 600 mg        600 mg 100 mL/hr over 30 Minutes Intravenous On call to O.R. 05/08/20 2306 05/09/20 9629   05/09/20 0600  gentamicin (GARAMYCIN) 130 mg in dextrose 5 % 50 mL IVPB        1.5 mg/kg  85.3 kg 106.5 mL/hr over 30 Minutes Intravenous On call to O.R. 05/08/20 2306 05/09/20 0559   05/09/20 0000  ampicillin (OMNIPEN) 2 g in sodium chloride 0.9 % 100 mL IVPB  Status:  Discontinued        2 g 300 mL/hr over 20 Minutes Intravenous Every 6 hours 05/08/20 2306 05/09/20 2135      REVIEW OF SYSTEMS:  Const:  fever,   chills, negative weight loss Eyes: negative diplopia or visual changes, negative eye pain ENT: negative coryza, negative sore throat Resp: negative cough, hemoptysis, dyspnea Cards: negative for chest pain, palpitations, lower extremity edema GU: negative for frequency, dysuria and hematuria GI: Negative for abdominal pain, diarrhea, bleeding, constipation Skin: negative for rash and pruritus Heme: negative for easy bruising and gum/nose bleeding MS: negative for myalgias, arthralgias, back pain and muscle weakness Neurolo:negative for headaches, dizziness, vertigo, memory problems  Psych: negative for feelings of anxiety, depression  Endocrine: negative for thyroid, diabetes Allergy/Immunology- negative for any medication or food allergies  Objective:  VITALS:  BP (!) 122/92 (BP Location: Left Arm) Comment: nurse Harless Litten notified  Pulse 97   Temp 98.5 F (36.9 C) (Oral)   Resp 20   Ht 5' 1.5" (1.562 m)   Wt 85.3 kg   LMP 07/27/2019 (Approximate)   SpO2 100%   Breastfeeding Unknown   BMI 34.95 kg/m  PHYSICAL EXAM:  General: Alert, cooperative, no distress, appears stated age.  Head: Normocephalic, without obvious abnormality, atraumatic. Eyes: Conjunctivae clear, anicteric sclerae. Pupils are equal ENT Nares normal. No drainage or sinus tenderness. Lips, mucosa, and tongue normal. No Thrush Neck: Supple, symmetrical, no adenopathy, thyroid: non tender no carotid bruit and no JVD. Back: No  CVA tenderness. Lungs: Clear to auscultation bilaterally. No Wheezing or Rhonchi. No rales. Heart: Regular rate and rhythm, no murmur, rub or gallop. Abdomen: Soft, non-tender,post partum uterus Extremities: atraumatic, no cyanosis. No edema. No clubbing Skin: No rashes or lesions. Or bruising Lymph: Cervical, supraclavicular normal. Neurologic: Grossly non-focal Pertinent Labs Lab Results CBC    Component Value Date/Time   WBC 15.9 (H) 05/12/2020 0502   RBC 3.25 (L)  05/12/2020 0502   HGB 8.4 (L) 05/12/2020 0502   HGB 11.3 10/29/2019 1610   HGB 11.3 10/29/2019 1610   HCT 26.0 (L) 05/12/2020 0502   HCT 34.9 10/29/2019 1610   HCT 34.7 10/29/2019 1610   PLT 295 05/12/2020 0502   PLT 260 10/29/2019 1610   PLT 252 10/29/2019 1610   MCV 80.0 05/12/2020 0502   MCV 82 10/29/2019 1610   MCV 80 09/11/2012 1808   MCH 25.8 (L) 05/12/2020 0502   MCHC 32.3 05/12/2020 0502   RDW 15.8 (H) 05/12/2020 0502   RDW 13.0 10/29/2019 1610   RDW 14.0 09/11/2012 1808   LYMPHSABS 1.3 05/09/2020 0104   LYMPHSABS 2.8 10/29/2019 1610   LYMPHSABS 2.3 09/11/2012 1808   MONOABS 2.8 (H) 05/09/2020 0104   MONOABS 1.2 (H) 09/11/2012 1808   EOSABS 0.0 05/09/2020 0104   EOSABS 0.1 10/29/2019 1610   EOSABS 0.1 09/11/2012 1808   BASOSABS 0.1 05/09/2020 0104   BASOSABS 0.1 10/29/2019 1610   BASOSABS 0.1 09/11/2012 1808    CMP Latest Ref Rng & Units 05/12/2020 05/11/2020 05/11/2020  Glucose 70 - 99 mg/dL - 76 -  BUN 6 - 20 mg/dL - 7 -  Creatinine 9.98 - 1.00 mg/dL 3.38 2.50 5.39  Sodium 135 - 145 mmol/L - 139 -  Potassium 3.5 - 5.1 mmol/L - 3.3(L) -  Chloride 98 - 111 mmol/L - 109 -  CO2 22 - 32 mmol/L - 21(L) -  Calcium 8.9 - 10.3 mg/dL - 7.6(B) -  Total Protein 6.5 - 8.1 g/dL - 6.4(L) -  Total Bilirubin 0.3 - 1.2 mg/dL - 0.4 -  Alkaline Phos 38 - 126 U/L - 100 -  AST 15 - 41 U/L - 23 -  ALT 0 - 44 U/L - 24 -      Microbiology: Recent Results (from the past 240 hour(s))  Chlamydia/NGC rt PCR ([facility] only)     Status: None   Collection Time: 05/06/20  1:06 PM  Result Value Ref Range Status   Specimen source GC/Chlam ENDOCERVICAL  Final   Chlamydia Tr NOT DETECTED NOT DETECTED Final   N gonorrhoeae NOT DETECTED NOT DETECTED Final    Comment: (NOTE) This CT/NG assay has not been evaluated in patients with a history of  hysterectomy. Performed at [facility], [address] [place], [place] [address]   Group B strep by PCR     Status: None   Collection  Time: 05/06/20  1:06 PM   Specimen: Genital  Result Value Ref Range Status   Group B strep by PCR NEGATIVE NEGATIVE Final    Comment: (NOTE) Intrapartum testing with Xpert GBS assay should be used as an adjunct to other methods available and not used to replace antepartum testing (at 35-[redacted] weeks gestation). Performed at [facility], [address] [place], [place] [address]   Resp Panel by RT-PCR (Flu A&B, Covid) Nasopharyngeal Swab     Status: None   Collection Time: 05/06/20  7:17 PM   Specimen: Nasopharyngeal Swab; Nasopharyngeal(NP) swabs in vial transport  medium  Result Value Ref Range Status   SARS Coronavirus 2 by RT PCR NEGATIVE NEGATIVE Final    Comment: (NOTE) SARS-CoV-2 target nucleic acids are NOT DETECTED.  The SARS-CoV-2 RNA is generally detectable in upper respiratory specimens during the acute phase of infection. The lowest concentration of SARS-CoV-2 viral copies this assay can detect is 138 copies/mL. A negative result does not preclude SARS-Cov-2 infection and should not be used as the sole basis for treatment or other patient management decisions. A negative result may occur with  improper specimen collection/handling, submission of specimen other than nasopharyngeal swab, presence of viral mutation(s) within the areas targeted by this assay, and inadequate number of viral copies(<138 copies/mL). A negative result must be combined with clinical observations, patient history, and epidemiological information. The expected result is Negative.  Fact Sheet for Patients:  BloggerCourse.comhttps://www.fda.gov/media/152166/download  Fact Sheet for Healthcare Providers:  SeriousBroker.ithttps://www.fda.gov/media/152162/download  This test is no t yet approved or cleared by the [place] FDA and  has been authorized for detection and/or diagnosis of SARS-CoV-2 by FDA under an Emergency Use Authorization (EUA). This EUA will remain  in effect (meaning this test can be used) for the  duration of the COVID-19 declaration under Section 564(b)(1) of the Act, 21 U.S.C.section 360bbb-3(b)(1), unless the authorization is terminated  or revoked sooner.       Influenza A by PCR NEGATIVE NEGATIVE Final   Influenza B by PCR NEGATIVE NEGATIVE Final    Comment: (NOTE) The Xpert Xpress SARS-CoV-2/FLU/RSV plus assay is intended as an aid in the diagnosis of influenza from Nasopharyngeal swab specimens and should not be used as a sole basis for treatment. Nasal washings and aspirates are unacceptable for Xpert Xpress SARS-CoV-2/FLU/RSV testing.  Fact Sheet for Patients: BloggerCourse.comhttps://www.fda.gov/media/152166/download  Fact Sheet for Healthcare Providers: SeriousBroker.ithttps://www.fda.gov/media/152162/download  This test is not yet approved or cleared by the [place] FDA and has been authorized for detection and/or diagnosis of SARS-CoV-2 by FDA under an Emergency Use Authorization (EUA). This EUA will remain in effect (meaning this test can be used) for the duration of the COVID-19 declaration under Section 564(b)(1) of the Act, 21 U.S.C. section 360bbb-3(b)(1), unless the authorization is terminated or revoked.  Performed at [facility], [address] [place], [place] [address]   Urine Culture     Status: Abnormal (Preliminary result)   Collection Time: 05/08/20 11:16 PM   Specimen: Urine, Catheterized  Result Value Ref Range Status   Specimen Description   Final    URINE, CATHETERIZED Performed at [facility], [address] [place], [place] [address]    Special Requests   Final    Normal Performed at [facility], [address] [place], [place] [address]    Culture (A)  Final    70,000 COLONIES/mL SERRATIA MARCESCENS SUSCEPTIBILITIES TO FOLLOW Performed at [facility], [address] [place], [place] [address]    Report Status PENDING  Incomplete  Culture, blood (x 2)     Status: None (Preliminary result)   Collection Time:  05/09/20  1:07 AM   Specimen: BLOOD  Result Value Ref Range Status   Specimen Description BLOOD BLOOD LEFT HAND  Final   Special Requests   Final    BOTTLES DRAWN AEROBIC AND ANAEROBIC Blood Culture adequate volume   Culture   Final    NO GROWTH 3 DAYS Performed at [facility], [address] [place], [place] [address]    Report Status PENDING  Incomplete  Culture, blood (x 2)     Status: Abnormal   Collection Time: 05/09/20  1:11 AM   Specimen: BLOOD  Result Value Ref Range Status   Specimen Description   Final    BLOOD RIGHT ANTECUBITAL Performed at [facility], [address], [place], [place] [address]    Special Requests   Final    BOTTLES DRAWN AEROBIC AND ANAEROBIC Blood Culture adequate volume Performed at [facility], [address] [place], [place] [address]    Culture  Setup Time   Final    Organism ID to follow GRAM NEGATIVE RODS AEROBIC BOTTLE ONLY CRITICAL RESULT CALLED TO, READ BACK BY AND VERIFIED WITH: BEN BEERS AT 1132 05/10/20.PMF Performed at [facility], [address] [place], [place] [address]    Culture SERRATIA MARCESCENS (A)  Final   Report Status 05/12/2020 FINAL  Final   Organism ID, Bacteria SERRATIA MARCESCENS  Final      Susceptibility   Serratia marcescens - MIC*    CEFAZOLIN >=64 RESISTANT Resistant     CEFEPIME 0.25 SENSITIVE Sensitive     CEFTAZIDIME <=1 SENSITIVE Sensitive     CEFTRIAXONE <=0.25 SENSITIVE Sensitive     CIPROFLOXACIN <=0.25 SENSITIVE Sensitive     GENTAMICIN <=1 SENSITIVE Sensitive     TRIMETH/SULFA <=20 SENSITIVE Sensitive     * SERRATIA MARCESCENS  Blood Culture ID Panel (Reflexed)     Status: Abnormal   Collection Time: 05/09/20  1:11 AM  Result Value Ref Range Status   Enterococcus faecalis NOT DETECTED NOT DETECTED Final   Enterococcus Faecium NOT DETECTED NOT DETECTED Final   Listeria monocytogenes NOT DETECTED NOT DETECTED Final   Staphylococcus species NOT  DETECTED NOT DETECTED Final   Staphylococcus aureus (BCID) NOT DETECTED NOT DETECTED Final   Staphylococcus epidermidis NOT DETECTED NOT DETECTED Final   Staphylococcus lugdunensis NOT DETECTED NOT DETECTED Final   Streptococcus species NOT DETECTED NOT DETECTED Final   Streptococcus agalactiae NOT DETECTED NOT DETECTED Final   Streptococcus pneumoniae NOT DETECTED NOT DETECTED Final   Streptococcus pyogenes NOT DETECTED NOT DETECTED Final   A.calcoaceticus-baumannii NOT DETECTED NOT DETECTED Final   Bacteroides fragilis NOT DETECTED NOT DETECTED Final   Enterobacterales DETECTED (A) NOT DETECTED Final    Comment: Enterobacterales represent a large order of gram negative bacteria, not a single organism. CRITICAL RESULT CALLED TO, READ BACK BY AND VERIFIED WITH: BEN BEERS AT 1132 05/10/20.PMF    Enterobacter cloacae complex NOT DETECTED NOT DETECTED Final   Escherichia coli NOT DETECTED NOT DETECTED Final   Klebsiella aerogenes NOT DETECTED NOT DETECTED Final   Klebsiella oxytoca NOT DETECTED NOT DETECTED Final   Klebsiella pneumoniae NOT DETECTED NOT DETECTED Final   Proteus species NOT DETECTED NOT DETECTED Final   Salmonella species NOT DETECTED NOT DETECTED Final   Serratia marcescens DETECTED (A) NOT DETECTED Final    Comment: CRITICAL RESULT CALLED TO, READ BACK BY AND VERIFIED WITH: BEN BEERS AT 1132 05/10/20.PMF    Haemophilus influenzae NOT DETECTED NOT DETECTED Final   Neisseria meningitidis NOT DETECTED NOT DETECTED Final   Pseudomonas aeruginosa NOT DETECTED NOT DETECTED Final   Stenotrophomonas maltophilia NOT DETECTED NOT DETECTED Final   Candida albicans NOT DETECTED NOT DETECTED Final   Candida auris NOT DETECTED NOT DETECTED Final   Candida glabrata NOT DETECTED NOT DETECTED Final   Candida krusei NOT DETECTED NOT DETECTED Final   Candida parapsilosis NOT DETECTED NOT DETECTED Final   Candida tropicalis NOT DETECTED  NOT DETECTED Final   Cryptococcus neoformans/gattii  NOT DETECTED NOT DETECTED Final   CTX-M ESBL NOT DETECTED NOT DETECTED Final   Carbapenem resistance IMP NOT DETECTED NOT DETECTED Final   Carbapenem resistance KPC NOT DETECTED NOT DETECTED Final   Carbapenem resistance NDM NOT DETECTED NOT DETECTED Final   Carbapenem resist OXA 48 LIKE NOT DETECTED NOT DETECTED Final   Carbapenem resistance VIM NOT DETECTED NOT DETECTED Final    Comment: Performed at [facility], [address] [place], [place] [address]  Resp Panel by RT-PCR (Flu A&B, Covid) Nasopharyngeal Swab     Status: None   Collection Time: 05/09/20 12:59 PM   Specimen: Nasopharyngeal Swab; Nasopharyngeal(NP) swabs in vial transport medium  Result Value Ref Range Status   SARS Coronavirus 2 by RT PCR NEGATIVE NEGATIVE Final    Comment: (NOTE) SARS-CoV-2 target nucleic acids are NOT DETECTED.  The SARS-CoV-2 RNA is generally detectable in upper respiratory specimens during the acute phase of infection. The lowest concentration of SARS-CoV-2 viral copies this assay can detect is 138 copies/mL. A negative result does not preclude SARS-Cov-2 infection and should not be used as the sole basis for treatment or other patient management decisions. A negative result may occur with  improper specimen collection/handling, submission of specimen other than nasopharyngeal swab, presence of viral mutation(s) within the areas targeted by this assay, and inadequate number of viral copies(<138 copies/mL). A negative result must be combined with clinical observations, patient history, and epidemiological information. The expected result is Negative.  Fact Sheet for Patients:  BloggerCourse.com  Fact Sheet for Healthcare Providers:  SeriousBroker.it  This test is no t yet approved or cleared by the [place] FDA and  has been authorized for detection and/or diagnosis of SARS-CoV-2 by FDA under an Emergency Use Authorization  (EUA). This EUA will remain  in effect (meaning this test can be used) for the duration of the COVID-19 declaration under Section 564(b)(1) of the Act, 21 U.S.C.section 360bbb-3(b)(1), unless the authorization is terminated  or revoked sooner.       Influenza A by PCR NEGATIVE NEGATIVE Final   Influenza B by PCR NEGATIVE NEGATIVE Final    Comment: (NOTE) The Xpert Xpress SARS-CoV-2/FLU/RSV plus assay is intended as an aid in the diagnosis of influenza from Nasopharyngeal swab specimens and should not be used as a sole basis for treatment. Nasal washings and aspirates are unacceptable for Xpert Xpress SARS-CoV-2/FLU/RSV testing.  Fact Sheet for Patients: BloggerCourse.com  Fact Sheet for Healthcare Providers: SeriousBroker.it  This test is not yet approved or cleared by the [place] FDA and has been authorized for detection and/or diagnosis of SARS-CoV-2 by FDA under an Emergency Use Authorization (EUA). This EUA will remain in effect (meaning this test can be used) for the duration of the COVID-19 declaration under Section 564(b)(1) of the Act, 21 U.S.C. section 360bbb-3(b)(1), unless the authorization is terminated or revoked.  Performed at [facility], [address] [place], [place] [address]     IMAGING RESULTS: Diffusely inhomogeneous enhancement of the left kidney, an appearance consistent with acute bacterial nephritis/pyelonephritis. Left kidney subtly edematous. No frank abscess apparent.  2. 1 mm nonobstructing calculus mid left kidney. No hydronephrosis or ureteral calculus on either side. Urinary bladder wall thickness normal.  3. Diffuse stool throughout much of colon. Suspect a degree of constipation.  4.  Enlarged uterus consistent with recent postpartum state.  5. Small left pleural effusion with left base atelectasis. Suspect early developing pneumonia  posterior left base. I have  personally reviewed the films ? Impression/Recommendation ? Serratia bacteremia with serratia UTi and pyelonephritis due to left nephrolithiasis. Currently on IV Cefepime Serratia susceptible to cipro? She is not going to breast feed- Her partner was in the room when I was seeing her Pt is keen to go home today- She could go on PO cipro  BID for 10 days Follow up with urology and Ob  Post partum- normal vaginal delivery on 05/07/20  HTN during pregnancy on labetolol ad nifedipine ? ___________________________________________________ Discussed with patient, and her partner the side effects of cipro Discussed with OB Midwife . Note:  This document was prepared using Dragon voice recognition software and may include unintentional dictation errors.

## 2020-05-12 NOTE — Progress Notes (Signed)
Postpartum Day  4  Subjective: no complaints, up ad lib, voiding and tolerating PO  Doing well, no concerns. Ambulating without difficulty, pain managed with PO meds, tolerating regular diet, and voiding without difficulty.   No fever/chills, chest pain, shortness of breath, nausea/vomiting, or leg pain. No nipple or breast pain. No headache, visual changes, or RUQ/epigastric pain.  No uterine/abdominal pain, reports side pain has significantly improved.   Objective: BP (!) 122/92 (BP Location: Left Arm) Comment: nurse Harless Litten notified  Pulse 97   Temp 98.5 F (36.9 C) (Oral)   Resp 20   Ht 5' 1.5" (1.562 m)   Wt 85.3 kg   LMP 07/27/2019 (Approximate)   SpO2 100%   Breastfeeding Unknown   BMI 34.95 kg/m    Physical Exam:  General: alert, cooperative and no distress Breasts: soft/nontender CV: RRR Pulm: nl effort, CTABL Abdomen: soft, non-tender, active bowel sounds Uterine Fundus: firm Perineum: minimal edema, intact Lochia: appropriate DVT Evaluation: No evidence of DVT seen on physical exam.  Recent Labs    05/11/20 1026 05/12/20 0502  HGB 7.9* 8.4*  HCT 24.2* 26.0*  WBC 19.2* 15.9*  PLT 238 295    Assessment/Plan: 34 y.o. [id number] postpartum day # 4  -Continue routine postpartum care -Encouraged snug fitting bra, cold application, Tylenol PRN, and cabbage leaves for engorgement for formula feeding  -Maternal  - hemodynamically stable and asymptomatic; start PO ferrous sulfate BID with stool softeners  -Immunization status:   all immunizations up to date  -Blood pressures as follows: 05/12/20 1131 122/92Abnormal  05/12/20 0821 137/98Abnormal  05/12/20 02:57:38 139/93Abnormal  -Taking Procardia and labetalol  -ID consult by Dr. Rivka Safer.  Reviewed recommended antibiotic regimens.  Recommends cipro 500mg  BID for 10 days.  May give one dose today then start 10 day course tomorrow.   -Discussed discharge today versus waiting until tomorrow.  She  has been afebrile for > 24 hours and has clinically improved symptoms.   -Plan of care discussed with Dr. .  Will discharge today with antibiotic regimen.    Disposition: Desires discharge home today   LOS: 6 days   Dalbert Garnet, Gustavo Lah 05/12/2020, 3:43 PM   ----- 05/14/2020  Certified Nurse Midwife [facility]

## 2020-05-14 LAB — CULTURE, BLOOD (ROUTINE X 2)
Culture: NO GROWTH
Special Requests: ADEQUATE

## 2020-05-17 LAB — CULTURE, BLOOD (ROUTINE X 2)
Culture: NO GROWTH
Culture: NO GROWTH
Special Requests: ADEQUATE
Special Requests: ADEQUATE

## 2020-10-30 ENCOUNTER — Encounter: Payer: Self-pay | Admitting: Nurse Practitioner

## 2020-10-30 NOTE — Progress Notes (Unsigned)
Telephone call to patient regarding PAP follow up.  No answer, unable to leave a message.  Letter mailed to residence. Glenna Fellows, RN

## 2021-01-07 ENCOUNTER — Telehealth: Payer: Self-pay

## 2021-01-07 NOTE — Telephone Encounter (Signed)
Telephone call to patient regarding the need for a repeat PAP due 10-2020.  Phone number has been disconnected.  No response by patient to message by Glenna Fellows, NP on 10/30/2020 nor the PAP letter mailed on 10/30/2020.  Will close to Pap f/u until patient initiates contact with [facility]. Hart Carwin, RN

## 2021-04-16 IMAGING — CR DG ABDOMEN 1V
1 series · 3 of 3 positions shown · non-contrast
Comparison: None.

CLINICAL DATA: Dyspnea, sepsis after Ob procedure

EXAM:
ABDOMEN - 1 VIEW

[Series 1: dg abd 1 view · 0.14mm/px · 3 of 3 slices shown]
[im 1/3]
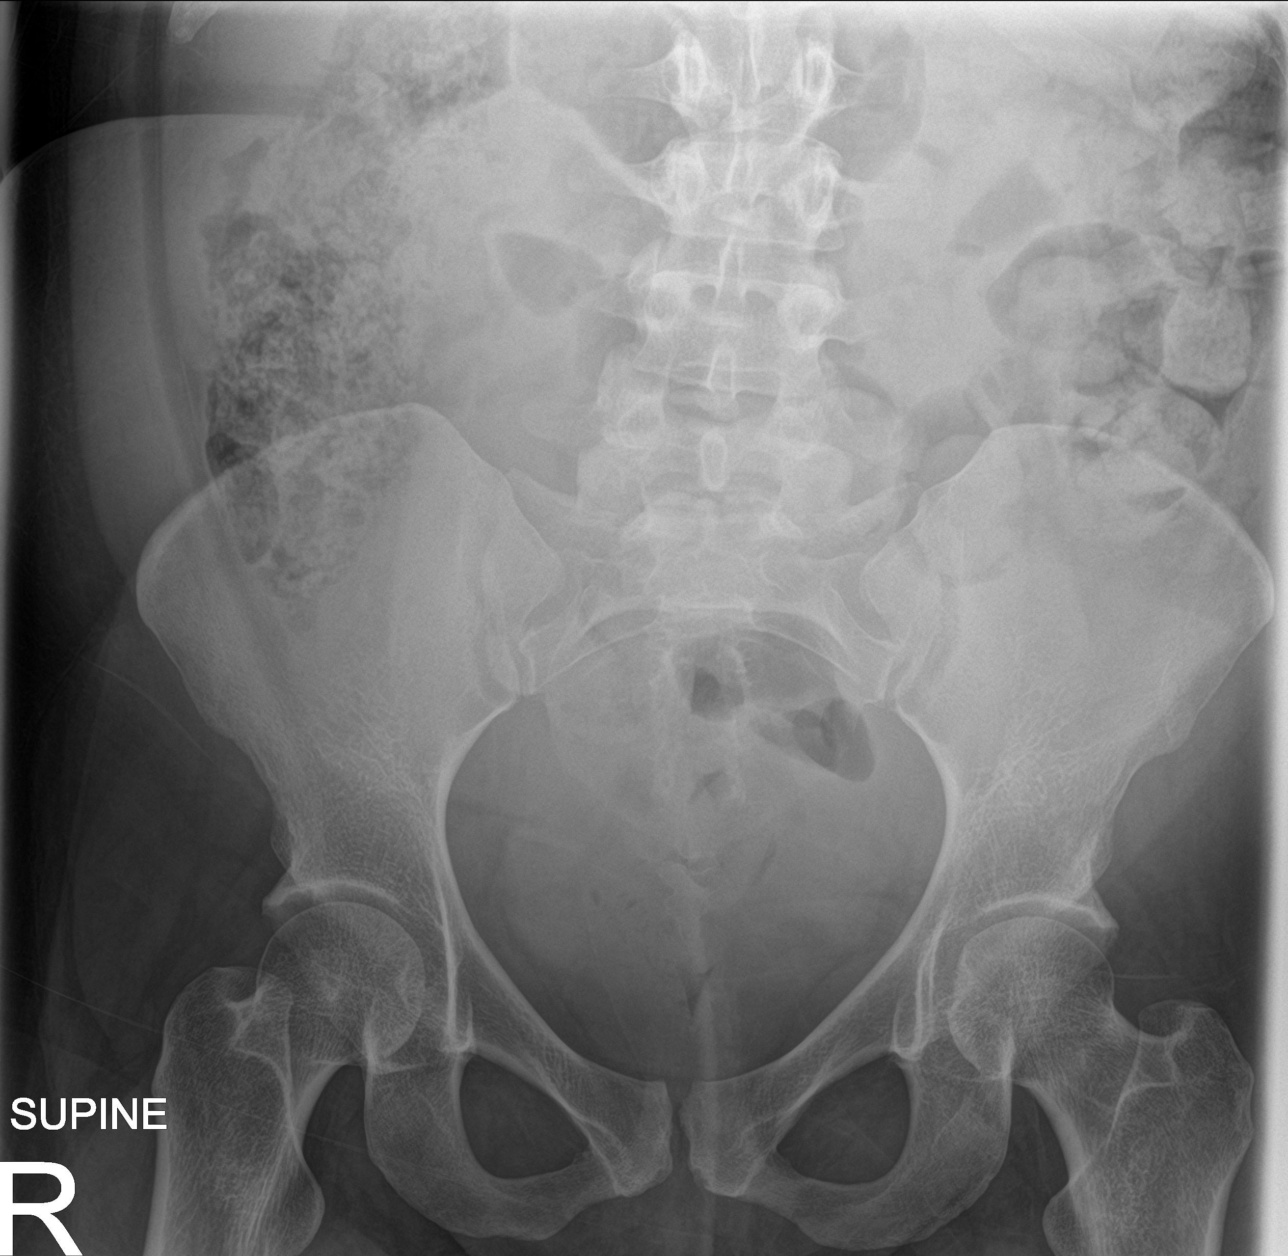
[im 2/3]
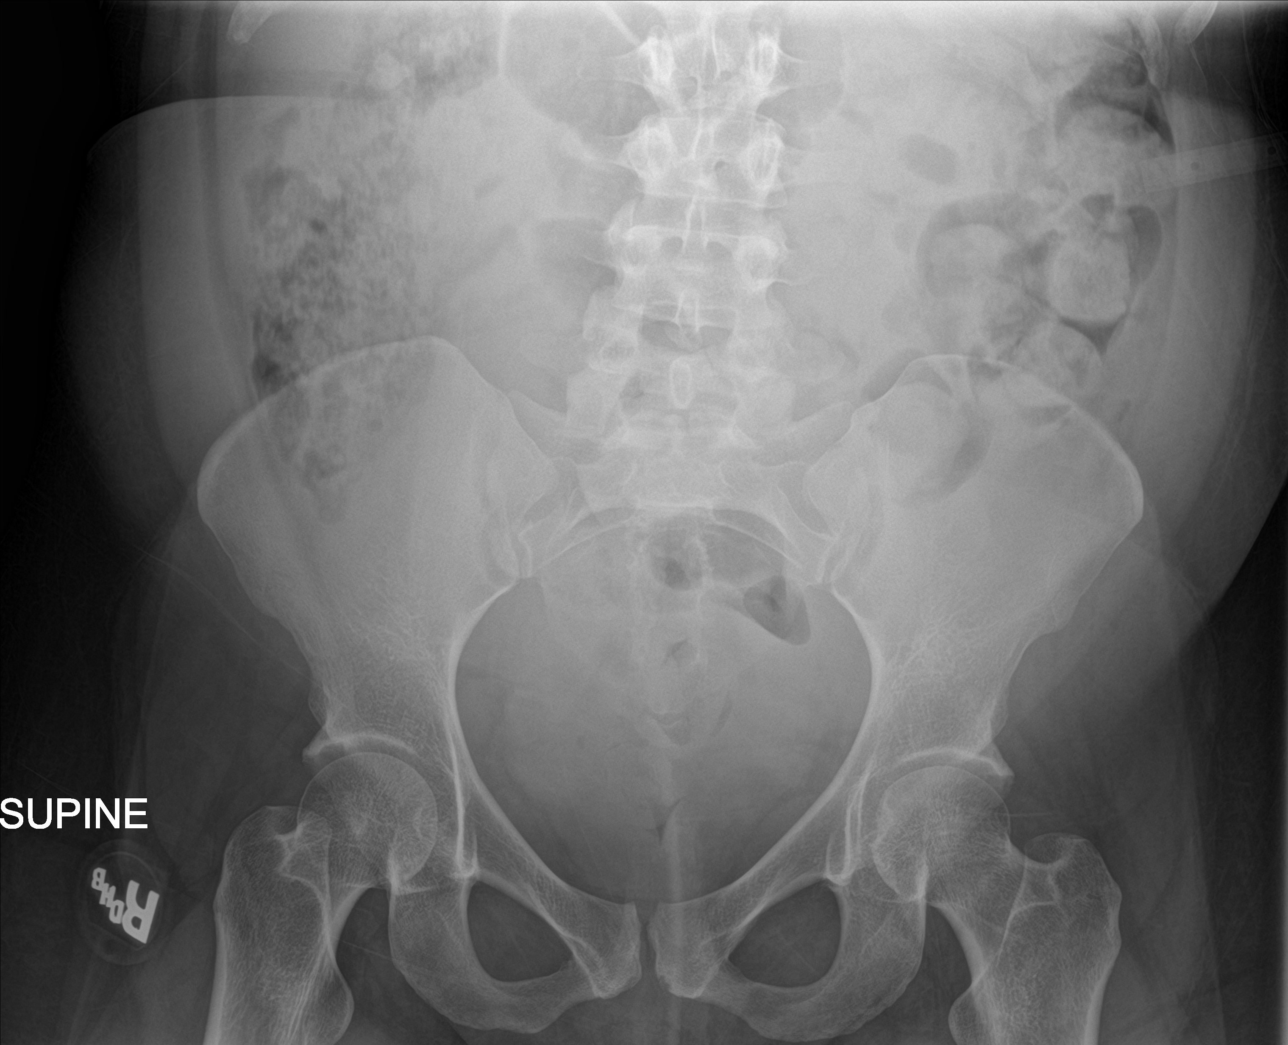
[im 3/3]
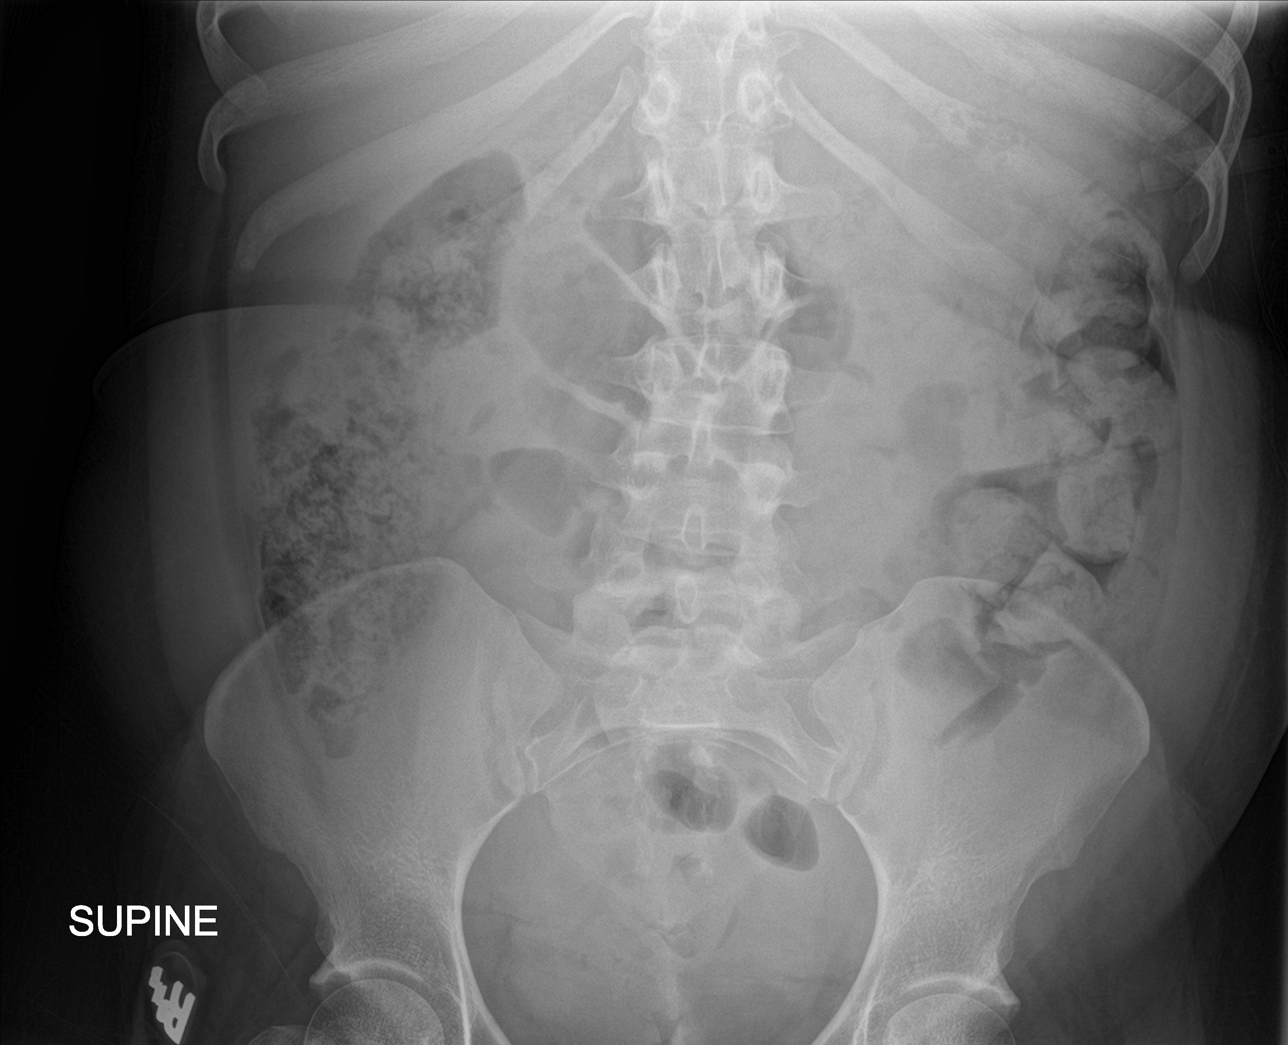

[3 of 3 positions shown; findings below may reference images not displayed]

FINDINGS: Moderate colonic stool burden. Few air-filled, borderline distended
loops of bowel are seen in the mid abdomen. Evaluation for free air
is limited on this supine only radiograph no suspicious abdominal
calcifications or acute osseous abnormalities.
IMPRESSION: 1. Moderate colonic stool burden.
2. Few air-filled, borderline distended loops of bowel in the mid
abdomen, nonspecific. Could reflect a focal ileus or early
obstruction.
3. Lucency along the periphery of the abdomen may reflect some
abdominal fat layer with evaluation of free air limited on a supine
only film. If there is clinical concern, upright or decubitus
radiographs could be obtained.

## 2021-04-17 IMAGING — US US RENAL
1 series · 15 of 25 positions shown · non-contrast
Comparison: Abdominal MRI 03/05/2020

CLINICAL DATA: Bilateral flank pain

EXAM:
RENAL / URINARY TRACT ULTRASOUND COMPLETE

[Series 1: us renal · 15 of 38 slices shown]
[im 1/38]
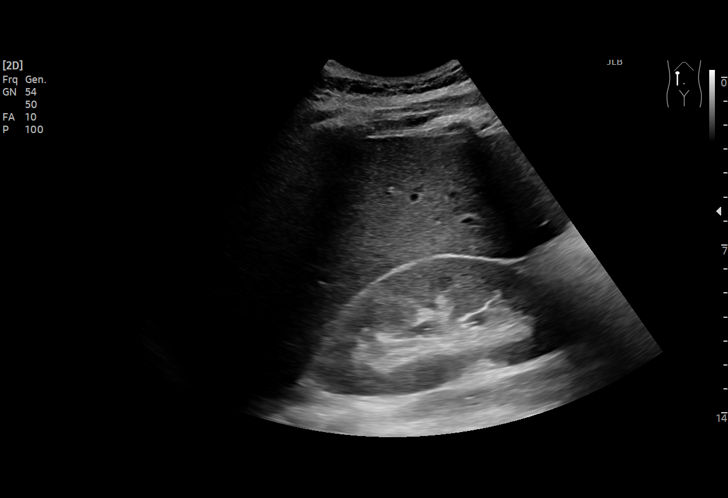
[im 4/38]
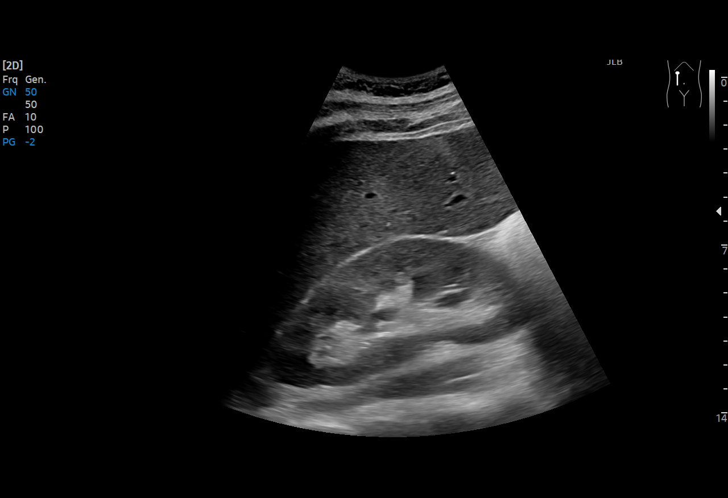
[im 7/38]
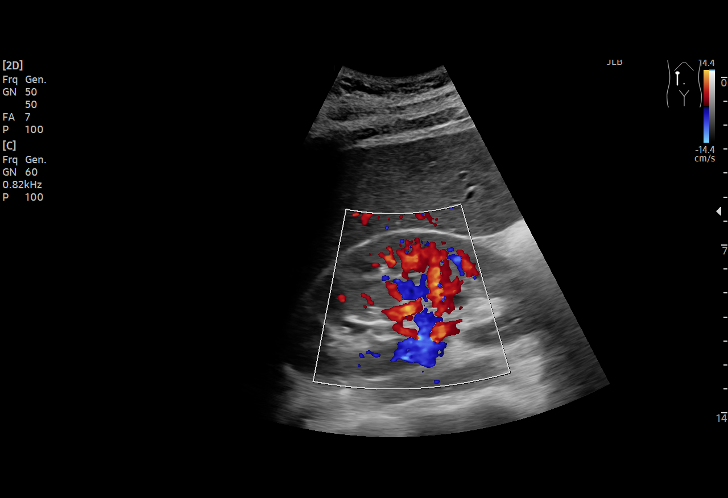
[im 8/38]
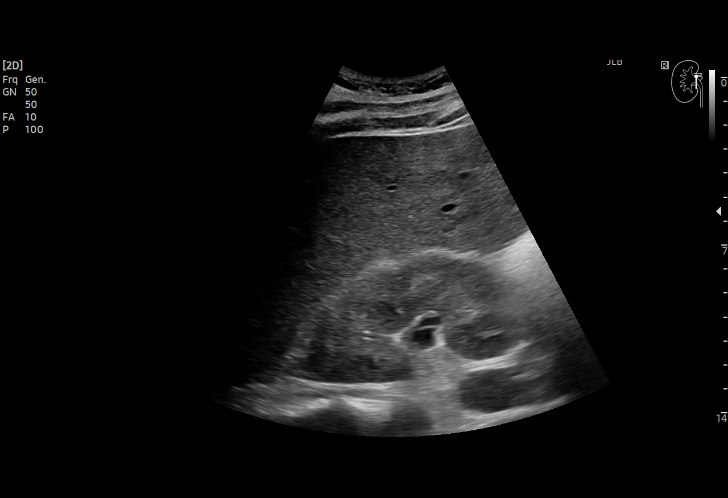
[im 11/38]
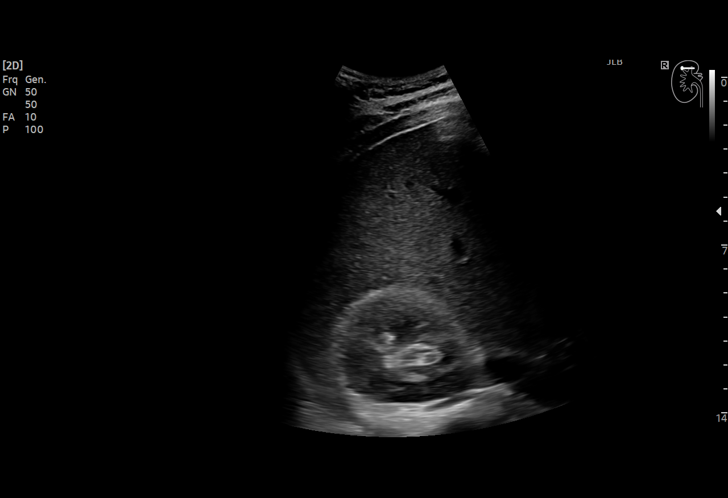
[im 14/38]
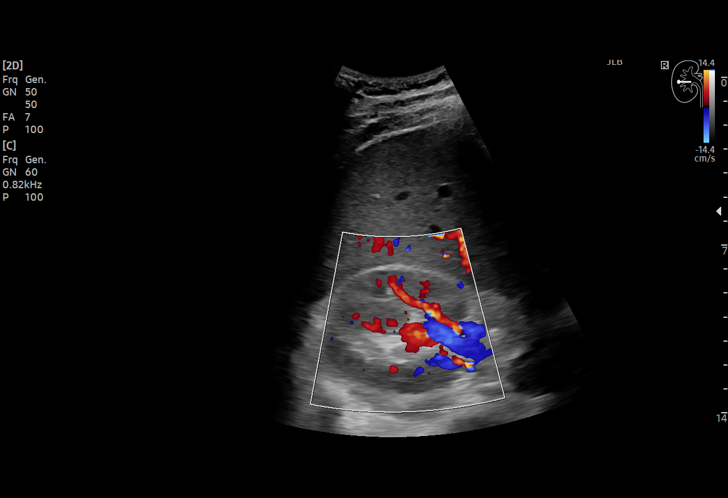
[im 16/38]
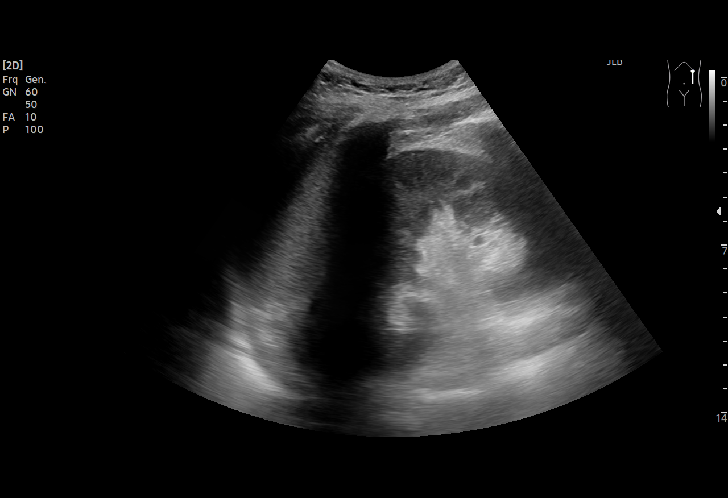
[im 19/38]
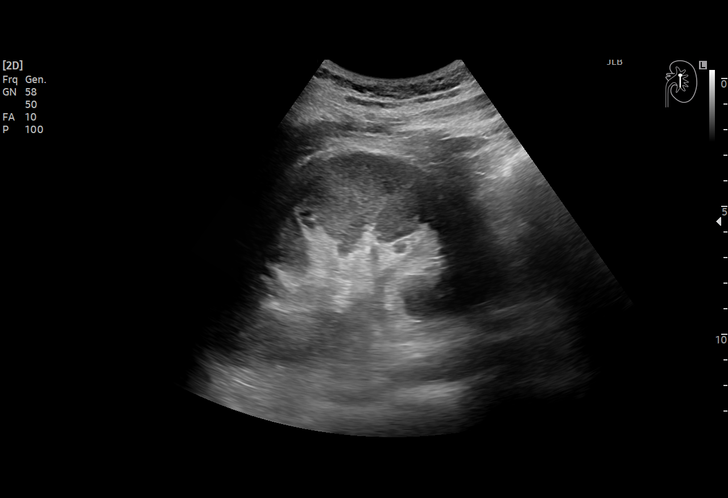
[im 22/38]
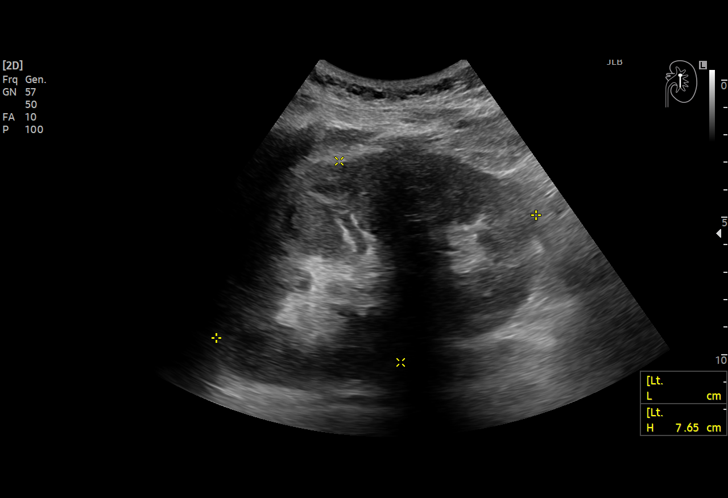
[im 24/38]
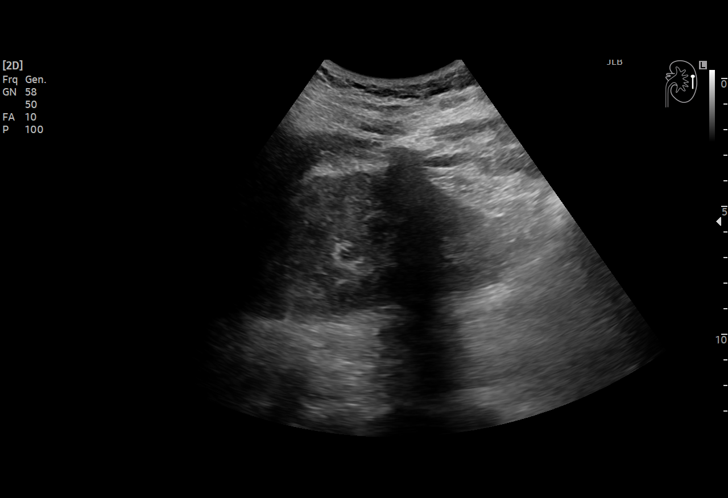
[im 27/38]
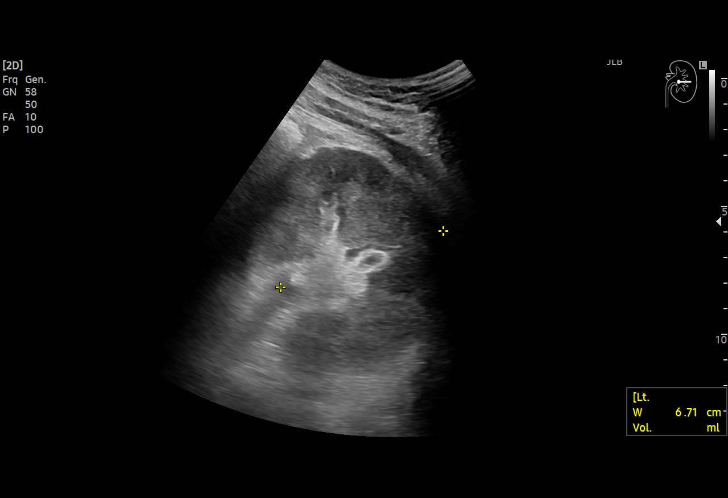
[im 30/38]
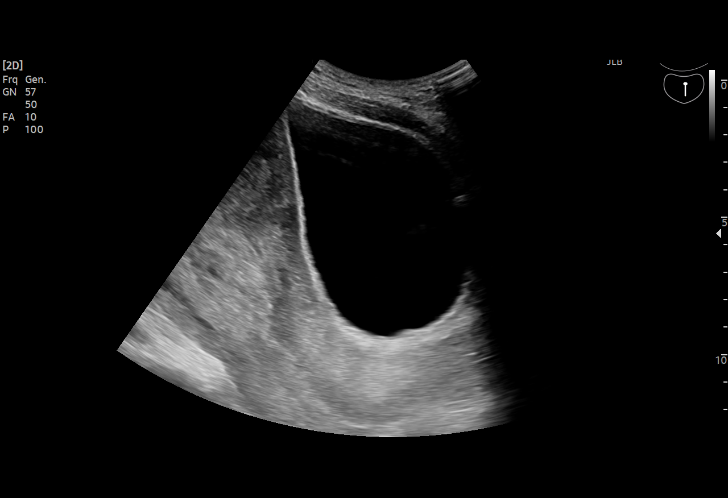
[im 31/38]
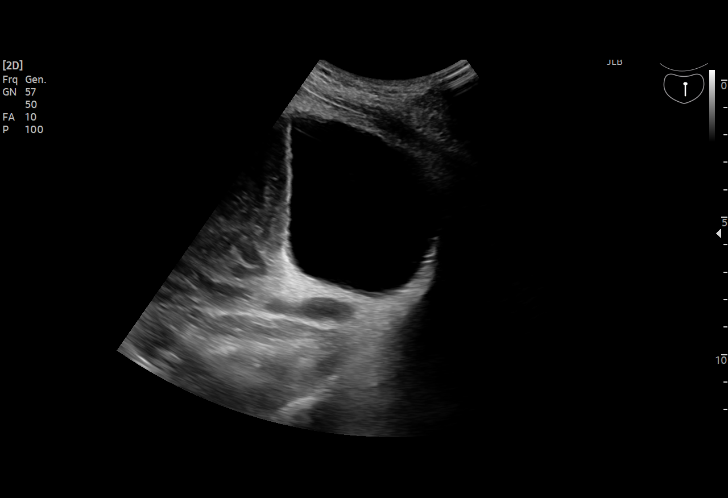
[im 34/38]
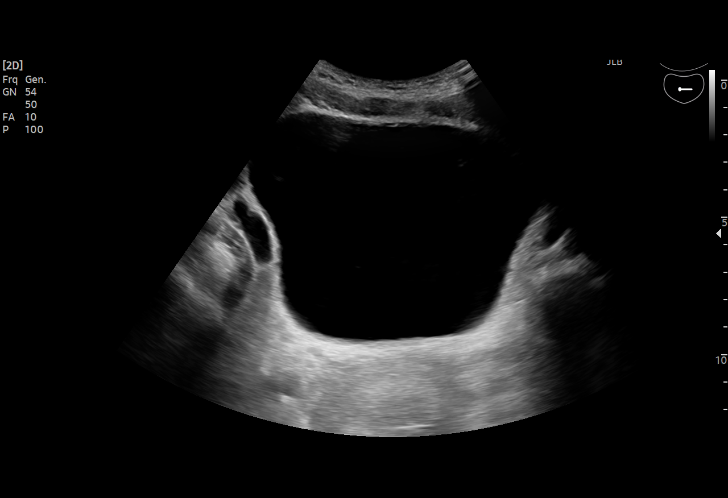
[im 38/38]
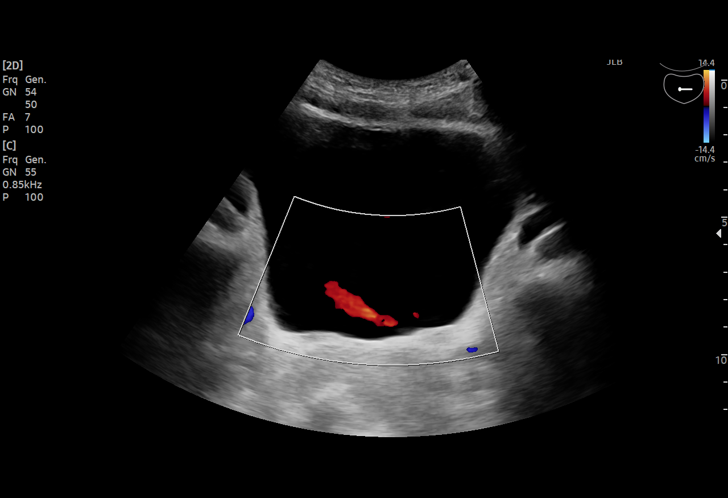

[15 of 25 positions shown; findings below may reference images not displayed]

FINDINGS: Right Kidney:

Renal measurements: 11.3 x 5.2 x 5.6 cm = volume: 173 mL.
Echogenicity within normal limits. No mass or hydronephrosis
visualized.

Left Kidney:

Renal measurements: 12.4 x 7.7 x 6.7 cm = volume: 334 mL. This size
asymmetry is unchanged from prior, making pyelonephritis unlikely.
Bidirectional blood flow seen at the hilum. Echogenicity within
normal limits. No mass or hydronephrosis visualized.

Bladder:

Appears normal for degree of bladder distention. Bilateral ureteral
jets.
IMPRESSION: Stable from prior.  No recurrent hydronephrosis.

## 2021-06-28 ENCOUNTER — Emergency Department
Admission: EM | Admit: 2021-06-28 | Discharge: 2021-06-28 | Disposition: A | Discharge status: Home / Self Care | Payer: Medicaid Other | Attending: Emergency Medicine | Admitting: Emergency Medicine

## 2021-06-28 ENCOUNTER — Encounter: Payer: Self-pay | Admitting: Emergency Medicine

## 2021-06-28 ENCOUNTER — Other Ambulatory Visit: Payer: Self-pay

## 2021-06-28 ENCOUNTER — Emergency Department: Payer: Medicaid Other

## 2021-06-28 DIAGNOSIS — J45909 Unspecified asthma, uncomplicated: Secondary | ICD-10-CM | POA: Insufficient documentation

## 2021-06-28 DIAGNOSIS — O209 Hemorrhage in early pregnancy, unspecified: Secondary | ICD-10-CM | POA: Insufficient documentation

## 2021-06-28 DIAGNOSIS — I10 Essential (primary) hypertension: Secondary | ICD-10-CM | POA: Diagnosis not present

## 2021-06-28 DIAGNOSIS — Z3A01 Less than 8 weeks gestation of pregnancy: Secondary | ICD-10-CM | POA: Insufficient documentation

## 2021-06-28 DIAGNOSIS — O4690 Antepartum hemorrhage, unspecified, unspecified trimester: Secondary | ICD-10-CM

## 2021-06-28 LAB — CBC WITH DIFFERENTIAL/PLATELET
Abs Immature Granulocytes: 0.02 10*3/uL (ref 0.00–0.07)
Basophils Absolute: 0.1 10*3/uL (ref 0.0–0.1)
Basophils Relative: 1 %
Eosinophils Absolute: 0.5 10*3/uL (ref 0.0–0.5)
Eosinophils Relative: 6 %
HCT: 34.8 % — ABNORMAL LOW (ref 36.0–46.0)
Hemoglobin: 11.1 g/dL — ABNORMAL LOW (ref 12.0–15.0)
Immature Granulocytes: 0 %
Lymphocytes Relative: 40 %
Lymphs Abs: 3.6 10*3/uL (ref 0.7–4.0)
MCH: 26.5 pg (ref 26.0–34.0)
MCHC: 31.9 g/dL (ref 30.0–36.0)
MCV: 83.1 fL (ref 80.0–100.0)
Monocytes Absolute: 0.5 10*3/uL (ref 0.1–1.0)
Monocytes Relative: 6 %
Neutro Abs: 4.4 10*3/uL (ref 1.7–7.7)
Neutrophils Relative %: 47 %
Platelets: 232 10*3/uL (ref 150–400)
RBC: 4.19 MIL/uL (ref 3.87–5.11)
RDW: 14 % (ref 11.5–15.5)
WBC: 9 10*3/uL (ref 4.0–10.5)
nRBC: 0 % (ref 0.0–0.2)

## 2021-06-28 LAB — COMPREHENSIVE METABOLIC PANEL
ALT: 10 U/L (ref 0–44)
AST: 14 U/L — ABNORMAL LOW (ref 15–41)
Albumin: 3.9 g/dL (ref 3.5–5.0)
Alkaline Phosphatase: 49 U/L (ref 38–126)
Anion gap: 6 (ref 5–15)
BUN: 12 mg/dL (ref 6–20)
CO2: 24 mmol/L (ref 22–32)
Calcium: 9.2 mg/dL (ref 8.9–10.3)
Chloride: 108 mmol/L (ref 98–111)
Creatinine, Ser: 0.83 mg/dL (ref 0.44–1.00)
GFR, Estimated: >60 mL/min (ref >60)
Glucose, Bld: 111 mg/dL — ABNORMAL HIGH (ref 70–99)
Potassium: 3.8 mmol/L (ref 3.5–5.1)
Sodium: 138 mmol/L (ref 135–145)
Total Bilirubin: 0.4 mg/dL (ref 0.3–1.2)
Total Protein: 7.4 g/dL (ref 6.5–8.1)

## 2021-06-28 LAB — TYPE AND SCREEN
ABO/RH(D): O POS
Antibody Screen: NEGATIVE

## 2021-06-28 LAB — HCG, QUANTITATIVE, PREGNANCY: hCG, Beta Chain, Quant, S: 13586 m[IU]/mL — ABNORMAL HIGH (ref <5)

## 2021-06-28 MED ORDER — HYDROCODONE-ACETAMINOPHEN 5-325 MG PO TABS: 1.0000 | ORAL_TABLET | ORAL | 0 refills | Status: DC | PRN

## 2021-06-28 MED ORDER — ONDANSETRON HCL 4 MG/2ML IJ SOLN
4.0000 mg | Freq: Once | INTRAMUSCULAR | Status: AC
Start: 2021-06-28 — End: 2021-06-28
  Administered 2021-06-28: 4 mg via INTRAVENOUS
  Filled 2021-06-28: qty 2

## 2021-06-28 MED ORDER — MORPHINE SULFATE (PF) 4 MG/ML IV SOLN
4.0000 mg | Freq: Once | INTRAVENOUS | Status: AC
  Administered 2021-06-28: 4 mg via INTRAVENOUS
  Filled 2021-06-28: qty 1

## 2021-06-28 MED ORDER — KETOROLAC TROMETHAMINE 30 MG/ML IJ SOLN
30.0000 mg | Freq: Once | INTRAMUSCULAR | Status: AC
  Administered 2021-06-28: 30 mg via INTRAVENOUS
  Filled 2021-06-28: qty 1

## 2021-06-28 MED ORDER — SODIUM CHLORIDE 0.9 % IV BOLUS (SEPSIS)
1000.0000 mL | Freq: Once | INTRAVENOUS | Status: AC
  Administered 2021-06-28: 1000 mL via INTRAVENOUS

## 2021-06-28 MED ORDER — ONDANSETRON 4 MG PO TBDP: 4.0000 mg | ORAL_TABLET | Freq: Four times a day (QID) | ORAL | 0 refills | Status: DC | PRN

## 2021-06-28 MED ORDER — IBUPROFEN 800 MG PO TABS: 800.0000 mg | ORAL_TABLET | Freq: Three times a day (TID) | ORAL | 0 refills | Status: DC | PRN

## 2021-06-28 NOTE — Discharge Instructions (Addendum)
I recommend close follow-up with your OB/GYN.  Please call tomorrow to schedule close appointment.  Your ultrasound showed what appeared to be a miscarriage.  I am sorry for your loss.  You will likely continue to bleed and pass clots, tissue over the next day or so.  If you begin having severe pain, fever of 100.4 or higher, abnormal vaginal discharge with odor, bleeding where you are soaking through more than a pad an hour for more than 2 straight hours or feel like you are going to pass out, have chest pain or shortness of breath, please return to the emergency department.  Your OB/GYN will likely follow your symptoms and your hCG blood test to ensure that all products of conception have been passed.  If they do not pass completely, your OB/GYN can offer medications and sometimes surgical intervention to complete the process. ? ? ?You are being provided a prescription for opiates (also known as narcotics) for pain control.  Opiates can be addictive and should only be used when absolutely necessary for pain control when other alternatives do not work.  We recommend you only use them for the recommended amount of time and only as prescribed.  Please do not take with other sedative medications or alcohol.  Please do not drive, operate machinery, make important decisions while taking opiates.  Please note that these medications can be addictive and have high abuse potential.  Patients can become addicted to narcotics after only taking them for a few days.  Please keep these medications locked away from children, teenagers or any family members with history of substance abuse.  Narcotic pain medicine may also make you constipated.  You may use over-the-counter medications such as MiraLAX, Colace to prevent constipation.  If you become constipated, you may use over-the-counter enemas as needed.  Itching and nausea are also common side effects of narcotic pain medication.  If you develop uncontrolled vomiting or a rash,  please stop these medications and seek medical care. ? ?

## 2021-06-28 NOTE — ED Triage Notes (Signed)
Pt to ED via POV with c/o vaginal bleeding. Pt states pain and vaginal bleeding that started earlier today. Pt states possibility she might be pregnant but is unsure. Pt with noted blood to her underwear on arrival to triage.  ? ?Pt states hx of irregular periods at this time.  ?

## 2021-06-28 NOTE — ED Notes (Signed)
Pt discharge information reviewed. Pt understands need for follow up care and when to return if symptoms worsen. All questions answered. Pt is alert and oriented with even and regular respirations. Pt is brought out of department in wheelchair and picked up by family. ?

## 2021-06-28 NOTE — ED Notes (Signed)
This RN assisted patient to the bathroom and assisted patient to be cleaned up and provided with brief, clean paper scrub pants, and pad. Pt noted to be dripping blood. Pt also stating +home preg test approx 1 week ago. This RN reaffirmed with patient no idea how far along patient might be.  ? ? ?Charge RN aware of large amounts of vaginal bleeding at this time and need for room.  ?

## 2021-06-28 NOTE — ED Provider Notes (Signed)
? ?[facility] ?Provider Note ? ? ? Event Date/Time  ? First MD Initiated Contact with Patient 06/28/21 [phone]   ?  (approximate) ? ? ?History  ? ?Vaginal Bleeding ? ? ?HPI ? ?Alvino BloodSabrina M Parrish is a 36 y.o. female G5, P4 who presents to the emergency department with complaints of vaginal bleeding.  States vaginal bleeding has been on and off but pain and passing clots started tonight.  States she just recently had a home pregnancy test was positive.  States her last period was sometime in February but she is not sure how far along she is that she has irregular menstrual cycles.  She has never had an ectopic or miscarriage before.  Before this she had no abnormal vaginal discharge, dysuria, abdominal pain, fever. ? ?States [facility].  Last delivered a child in March 2022. ? ? ?History provided by patient. ? ? ? ?Past Medical History:  ?Diagnosis Date  ? Asthma   ? Depression   ? Postpartum  ? History of chlamydia 12/27/2019  ? History of gonorrhea 12/27/2019  ? Hypertension affecting pregnancy   ? chronic  ? ? ?Past Surgical History:  ?Procedure Laterality Date  ? denies surgical history    ? NO PAST SURGERIES    ? ? ?MEDICATIONS:  ?Prior to Admission medications   ?Medication Sig Start Date End Date Taking? Authorizing Provider  ?acetaminophen (TYLENOL) 325 MG tablet Take 3 tablets (975 mg total) by mouth every 6 (six) hours. 03/05/20   Gustavo LahMackie, Anna M, CNM  ?albuterol (VENTOLIN HFA) 108 (90 Base) MCG/ACT inhaler Inhale 2 puffs into the lungs every 6 (six) hours as needed for wheezing or shortness of breath. 10/29/19   Staples, Eileen StanfordJenna L, PA-C  ?docusate sodium (COLACE) 100 MG capsule Take 1 capsule (100 mg total) by mouth daily. 03/06/20   Gustavo LahMackie, Anna M, CNM  ?ferrous sulfate 325 (65 FE) MG tablet Take 1 tablet (325 mg total) by mouth 2 (two) times daily with a meal. 05/12/20   Gustavo LahMackie, Anna M, CNM  ?ibuprofen (ADVIL) 600 MG tablet Take 1 tablet (600 mg total) by mouth every 6  (six) hours as needed for mild pain or moderate pain. 05/12/20   Gustavo LahMackie, Anna M, CNM  ?labetalol (NORMODYNE) 200 MG tablet Take 1 tablet (200 mg total) by mouth 2 (two) times daily. 05/12/20   Gustavo LahMackie, Anna M, CNM  ?NIFEdipine (ADALAT CC) 30 MG 24 hr tablet Take 1 tablet (30 mg total) by mouth daily. 05/13/20   Gustavo LahMackie, Anna M, CNM  ?Prenatal Vit-Fe Fumarate-FA (PRENATAL VITAMIN) 27-0.8 MG TABS Take 1 tablet by mouth daily. 10/11/19   Federico FlakeNewton, Kimberly Niles, MD  ? ? ?Physical Exam  ? ?Triage Vital Signs: ?ED Triage Vitals  ?Enc Vitals Group  ?   BP 06/28/21 0257 116/78  ?   Pulse Rate 06/28/21 0257 93  ?   Resp 06/28/21 0257 20  ?   Temp 06/28/21 0257 98.4 ?F (36.9 ?C)  ?   Temp Source 06/28/21 0257 Oral  ?   SpO2 06/28/21 0257 98 %  ?   Weight 06/28/21 0256 158 lb (71.7 kg)  ?   Height 06/28/21 0256 5\' 5"  (1.651 m)  ?   Head Circumference --   ?   Peak Flow --   ?   Pain Score 06/28/21 0256 8  ?   Pain Loc --   ?   Pain Edu? --   ?   Excl. in  GC? --   ? ? ?Most recent vital signs: ?Vitals:  ? 06/28/21 0530 06/28/21 0600  ?BP: 99/60 110/62  ?Pulse: 64 67  ?Resp: 14 14  ?Temp:    ?SpO2: 98% 99%  ? ? ?CONSTITUTIONAL: Alert and oriented and responds appropriately to questions.  Appears uncomfortable ?HEAD: Normocephalic, atraumatic ?EYES: Conjunctivae clear, pupils appear equal, sclera nonicteric ?ENT: normal nose; moist mucous membranes ?NECK: Supple, normal ROM ?CARD: RRR; S1 and S2 appreciated; no murmurs, no clicks, no rubs, no gallops ?RESP: Normal chest excursion without splinting or tachypnea; breath sounds clear and equal bilaterally; no wheezes, no rhonchi, no rales, no hypoxia or respiratory distress, speaking full sentences ?ABD/GI: Normal bowel sounds; non-distended; soft, non-tender, no rebound, no guarding, no peritoneal signs ?GU: Patient passing large clots at this time.  ?BACK: The back appears normal ?EXT: Normal ROM in all joints; no deformity noted, no edema; no cyanosis ?SKIN: Normal color for age and  race; warm; no rash on exposed skin ?NEURO: Moves all extremities equally, normal speech ?PSYCH: The patient's mood and manner are appropriate. ? ? ?ED Results / Procedures / Treatments  ? ?LABS: ?(all labs ordered are listed, but only abnormal results are displayed) ?Labs Reviewed  ?CBC WITH DIFFERENTIAL/PLATELET - Abnormal; Notable for the following components:  ?    Result Value  ? Hemoglobin 11.1 (*)   ? HCT 34.8 (*)   ? All other components within normal limits  ?COMPREHENSIVE METABOLIC PANEL - Abnormal; Notable for the following components:  ? Glucose, Bld 111 (*)   ? AST 14 (*)   ? All other components within normal limits  ?HCG, QUANTITATIVE, PREGNANCY - Abnormal; Notable for the following components:  ? hCG, Beta Chain, Quant, S 13,586 (*)   ? All other components within normal limits  ?TYPE AND SCREEN  ? ? ? ?EKG: ? ? ?RADIOLOGY: ?My personal review and interpretation of imaging: Findings on ultrasound suspicious for failed pregnancy. ? ?I have personally reviewed all radiology reports.   ?US OB LESS THAN 14 WEEKS W/ OB TRANSVAGINAL AND DOPPLER ? ?Result Date: 06/28/2021 ?CLINICAL DATA:  Vaginal bleeding. EXAM: OBSTETRIC <14 WK [place] AND TRANSVAGINAL OB US DOPPLER ULTRASOUND OF OVARIES TECHNIQUE: Both transabdominal and transvaginal ultrasound examinations were performed for complete evaluation of the gestation as well as the maternal uterus, adnexal regions, and pelvic cul-de-sac. Transvaginal technique was performed to assess early pregnancy. Color and duplex Doppler ultrasound was utilized to evaluate blood flow to the ovaries. COMPARISON:  None Available. FINDINGS: Intrauterine gestational sac: Within the lower uterine segment/cervix there is a ovoid anechoic fluid collection. Yolk sac:  None visualized Embryo:  None visualized Cardiac Activity: Not Visualized. Heart Rate: N/A bpm MSD: 14 mm   6 w   2 d Subchorionic hemorrhage:  None visualized. Maternal uterus/adnexae: Right ovary: Normal Left ovary:  Normal Other :None Free fluid:  None Pulsed Doppler evaluation of both ovaries demonstrates normal appearing low-resistance arterial and venous waveforms. IMPRESSION: 1. Ovoid fluid collection is identified within the lower uterine segment/cervix which may represent an early gestational sac. No yolk sac or embryo identified. Findings are suspicious but not yet definitive for failed pregnancy. Recommend follow-up US in 10-14 days and serial beta HCG for definitive diagnosis. Electronically Signed   By: Signa Kell M.D.   On: 06/28/2021 05:43   ? ? ?PROCEDURES: ? ?Critical Care performed: No ? ? ?.1-3 Lead EKG Interpretation ?Performed by: Jaheim Canino, Layla Maw, DO ?Authorized by: Cloe Sockwell, Layla Maw, DO  ? ?  Interpretation: normal   ?  ECG rate:  93 ?  ECG rate assessment: normal   ?  Rhythm: sinus rhythm   ?  Ectopy: none   ?  Conduction: normal   ? ? ? ?IMPRESSION / MDM / ASSESSMENT AND PLAN / ED COURSE  ?I reviewed the triage vital signs and the nursing notes. ? ? ? ?Patient here with vaginal bleeding in the setting of having a home pregnancy test that was positive. ? ?The patient is on the cardiac monitor to evaluate for evidence of arrhythmia and/or significant heart rate changes. ? ? ?DIFFERENTIAL DIAGNOSIS (includes but not limited to):   Spontaneous abortion, ectopic, torsion, TOA, less likely placental abruption ? ? ?PLAN: We will obtain CBC, BMP, hCG, ABO/Rh.  Patient will need a transvaginal ultrasound once her hCG is back.  We will keep her n.p.o.  Will give IV fluids, Toradol, morphine and Zofran. ? ? ?MEDICATIONS GIVEN IN ED: ?Medications  ?ketorolac (TORADOL) 30 MG/ML injection 30 mg (30 mg Intravenous Given 06/28/21 0416)  ?morphine (PF) 4 MG/ML injection 4 mg (4 mg Intravenous Given 06/28/21 0419)  ?ondansetron [facility]) injection 4 mg (4 mg Intravenous Given 06/28/21 0415)  ?sodium chloride 0.9 % bolus 1,000 mL (1,000 mLs Intravenous New Bag/Given 06/28/21 0414)  ? ? ? ?ED COURSE: Patient's labs show  hemoglobin of 11.1 which is actually markedly improved compared to her previous in March 2022.  hCG is greater than 13,000.  Blood type is O+.  She does not need RhoGAM.  Ultrasound pending for further evaluation.  P

## 2021-11-18 ENCOUNTER — Telehealth: Payer: Self-pay

## 2021-11-18 NOTE — Telephone Encounter (Signed)
Phone call to pt at [phone]. Received message that number has been changed, disconnected or no longer in service; unable to leave message.  Tried twice.  MyChart message sent.

## 2021-11-18 NOTE — Telephone Encounter (Signed)
Received CD Report from [facility] 11/17/21 of positive syphilis result for this pt: Specimen date 11/02/21 Source: Plasma Positive Syphilis  Upon review of pt records in Epic, pt has history of syphilis:  See 12/08/2018 lab and Antoine Primas PA notes regarding hx and treatment noted on lab (Reactive 1:128 on 06/14/2018 and treated with bicillin 2.4 MU).  Last test found in Epic is dated 05/06/21: Reactive, Titer 1:1 (over a year and a half ago).  Phone call to Brion Aliment, DIS; informed of new plasma testing and discussed hx. Doren Custard states needs to come to [facility] for titer.

## 2021-11-25 NOTE — Telephone Encounter (Signed)
Pt has STD appt scheduled at [facility] for 11/30/21.

## 2021-11-30 ENCOUNTER — Ambulatory Visit: Payer: Medicaid Other | Admitting: Advanced Practice Midwife

## 2021-11-30 ENCOUNTER — Encounter: Payer: Self-pay | Admitting: Advanced Practice Midwife

## 2021-11-30 DIAGNOSIS — F17200 Nicotine dependence, unspecified, uncomplicated: Secondary | ICD-10-CM | POA: Insufficient documentation

## 2021-11-30 DIAGNOSIS — Z113 Encounter for screening for infections with a predominantly sexual mode of transmission: Secondary | ICD-10-CM | POA: Diagnosis not present

## 2021-11-30 DIAGNOSIS — A5149 Other secondary syphilitic conditions: Secondary | ICD-10-CM

## 2021-11-30 LAB — HM HIV SCREENING LAB: HM HIV Screening: NEGATIVE

## 2021-11-30 MED ORDER — PENICILLIN G BENZATHINE 1200000 UNIT/2ML IM SUSY
2.4000 10*6.[IU] | PREFILLED_SYRINGE | Freq: Once | INTRAMUSCULAR | Status: AC
  Administered 2021-11-30: 2.4 10*6.[IU] via INTRAMUSCULAR

## 2021-11-30 NOTE — Progress Notes (Signed)
[facility] Department  STI clinic/screening visit [place] [address] [phone]  Subjective:  Tina Parrish is a 36 y.o. G5P4 SBF smoker female being seen today for an STI screening visit. The patient reports they do have symptoms.  Patient reports that they do not desire a pregnancy in the next year.   They reported they are not interested in discussing contraception today.    Patient's last menstrual period was 11/24/2021 (approximate).   Patient has the following medical conditions:   Patient Active Problem List   Diagnosis Date Noted   Kidney stone complicating pregnancy 90/24/0973   History of substance abuse ([place]) 12/27/2019   Marijuana abuse 12/27/2019   Mild intermittent asthma without complication 53/29/9242   Positive urine drug screen 10/29/19 MJ 11/06/2019   UTI (urinary tract infection) during pregnancy 10/29/19 E.Coli 50-100,000 & Serratia Marcescens 11/06/2019   Asthma 10/29/2019   GERD (gastroesophageal reflux disease) 10/29/2019   Gonorrhea and chlamydia (treated 10/2019) 10/29/2019   Abnormal Pap smear of cervix 12/18/2018   Former smoker (last use 09/2019) 12/08/2018   Syphilis, 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  C/o 4 sores on her right hand and 1 sore on left foot onset 2 wks ago painless chancres.  Last sex 11/13/21 with female; with current partner x 4 years. Last MJ today. Last cig yesterday. Last cigar 3 wks ago. Last ETOH 11/19/21 (2 shots liquor) occassional.    Last HIV test per patient/review of record was 05/06/20 Patient reports last pap was 10/29/19 neg HPV neg   Screening for MPX risk: Does the patient have an  unexplained rash? Yes Is the patient MSM? No Does the patient endorse multiple sex partners or anonymous sex partners? Yes Did the patient have close or sexual contact with a person diagnosed with MPX? No Has the patient traveled outside the [place] where MPX is endemic? No Is there a high clinical suspicion for MPX-- evidenced by one of the following No  -Unlikely to be chickenpox  -Lymphadenopathy  -Rash that present in same phase of evolution on any given body part See flowsheet for further details and programmatic requirements.   Immunization history:  Immunization History  Administered Date(s) Administered   Hepatitis B, PED/ADOLESCENT 11/06/1999, 12/18/1999, 05/27/2000   MMR 08/03/1989, 10/26/1993   Tdap 05/12/2020     The following portions of the patient's history were reviewed and updated as appropriate: allergies, current medications, past medical history, past social history, past surgical history and problem list.  Objective:  There were no vitals filed for this visit.  Physical Exam Vitals and nursing note reviewed.  Constitutional:      Appearance: Normal appearance.       Comments: 4 circular hypopigmented flat chancres on palm of right hand and 1 near sole of left foot x 2 wks painless  HENT:     Head: Normocephalic and atraumatic.     Mouth/Throat:     Mouth: Mucous membranes are moist.     Pharynx: Oropharynx is clear. No oropharyngeal exudate or posterior oropharyngeal erythema.  Eyes:     Conjunctiva/sclera: Conjunctivae normal.  Pulmonary:     Effort: Pulmonary effort  is normal.  Abdominal:     Palpations: Abdomen is soft. There is no mass.     Tenderness: There is no abdominal tenderness. There is no rebound.     Comments: Soft without masses or tenderness, fair tone  Genitourinary:    General: Normal vulva.     Exam position: Lithotomy position.     Pubic Area: No rash or pubic lice.      Labia:        Right: No rash or lesion.        Left: No rash  or lesion.      Vagina: Vaginal discharge (grey creamy leukorrhea, no ph paper available) present. No erythema, bleeding or lesions.     Cervix: Normal.     Uterus: Normal.      Adnexa: Right adnexa normal and left adnexa normal.     Rectum: Normal.     Comments: pH = no ph paper available Lymphadenopathy:     Head:     Right side of head: No preauricular or posterior auricular adenopathy.     Left side of head: No preauricular or posterior auricular adenopathy.     Cervical: No cervical adenopathy.     Right cervical: No superficial, deep or posterior cervical adenopathy.    Left cervical: No superficial, deep or posterior cervical adenopathy.     Upper Body:     Right upper body: No supraclavicular, axillary or epitrochlear adenopathy.     Left upper body: No supraclavicular, axillary or epitrochlear adenopathy.     Lower Body: No right inguinal adenopathy. No left inguinal adenopathy.  Skin:    General: Skin is warm and dry.     Findings: No rash.  Neurological:     Mental Status: She is alert and oriented to person, place, and time.      Assessment and Plan:  Tina Parrish is a 36 y.o. female presenting to the [facility] Department for STI screening  1. Screening examination for venereal disease Treat wet mount per standing orders Immunization nurse consult  - Syphilis Serology, [facility] - HIV [facility] - Gonococcus culture - WET PREP FOR TRICH, YEAST, CLUE - Chlamydia/Gonorrhea [facility] - penicillin g benzathine (BICILLIN LA) 1200000 UNIT/2ML injection 2.4 Million Units  2. Syphilis, secondary 12/15/18 pt with reactive RPR 1:128 on 06/14/18 and treated with Bicillin 2.4 mu IM 12/08/2018 RPR 1:4 with confirmatory test not done 10/29/19 RPR 1:2 05/06/2020 RPR 1:1 Today pt c/o painless sores on right palm and 1 on left foot onset 2 wks ago Consulted with A. White, FNP and will treat today with Bicillin 1 set     No follow-ups on file.  No  future appointments.  Herbie Saxon, CNM

## 2021-11-30 NOTE — Progress Notes (Signed)
Primary care list per patient request. Latex free condoms given per patient request.  Patient states had syphilis but doesn't believe her or her partner was treated.  BThiele RN

## 2021-11-30 NOTE — Progress Notes (Signed)
Per provider A. White, FNP verbal order, patient given Bicillin treatment and counseled to stay for observation for 15-20 minutes. Patient counseled no sex until results received and patient has talked with provider about follow-up. Patient states understanding.Jenetta Downer, RN

## 2021-12-01 LAB — WET PREP FOR TRICH, YEAST, CLUE
Trichomonas Exam: NEGATIVE
Yeast Exam: NEGATIVE

## 2021-12-01 NOTE — Telephone Encounter (Signed)
Pt seen in [facility] provider clinic 11/30/21. Clinic notes: C/o 4 sores on her right hand and 1 sore on left foot onset 2 wks ago painless chancres.  Exam: 4 circular hypopigmented flat chancres on palm of right hand and 1 near sole of left foot x 2 wks painless. Dx of secondary syphilis.  Bicillin set x 1.  Bicillin LA 2.4 MU IM administered on 11/30/21. Tx complete.

## 2021-12-05 LAB — GONOCOCCUS CULTURE

## 2021-12-08 ENCOUNTER — Telehealth: Payer: Self-pay

## 2021-12-08 NOTE — Telephone Encounter (Signed)
Call to pt re syphilis titer result from 11/30/21 specimen. RPR=Reactive Titer= 1:128 Sphilis TP= not done (hx of syphilis)  05/06/20 RPR titer= 1:1  At 11/30/21 visit provider noted symptoms: : 4 circular hypopigmented flat chancres on palm of right hand and 1 near sole of left foot x 2 wks painless  Pt treated with 2.4 MU Bicillin LA during 11/30/21 visit (dx with secondary syphilis).  [facility] provider reviewing lab result.

## 2021-12-09 NOTE — Telephone Encounter (Signed)
See Lorella Nimrod, FNP, written order dated 12/09/21 on TR: Bicillin 2.4 MU IM x 1 dose; no additional tx needed; repeat RPR in 6 months. Pt completed tx during 11/30/21 visit.  Calling pt re results, tx completed, and follow-up in 6 months, call to [phone].  Pt confirmed identity. Counseled pt about syphilis result, tx completed, and RTC in ~ 6 months to repeat testing. Pt expressed understanding.

## 2022-07-03 ENCOUNTER — Emergency Department
Admission: EM | Admit: 2022-07-03 | Discharge: 2022-07-03 | Disposition: A | Discharge status: Home / Self Care | Payer: Medicaid Other | Attending: Emergency Medicine | Admitting: Emergency Medicine

## 2022-07-03 ENCOUNTER — Encounter: Payer: Self-pay | Admitting: Emergency Medicine

## 2022-07-03 ENCOUNTER — Other Ambulatory Visit: Payer: Self-pay

## 2022-07-03 ENCOUNTER — Emergency Department: Payer: Medicaid Other

## 2022-07-03 DIAGNOSIS — N760 Acute vaginitis: Secondary | ICD-10-CM | POA: Insufficient documentation

## 2022-07-03 DIAGNOSIS — N939 Abnormal uterine and vaginal bleeding, unspecified: Secondary | ICD-10-CM

## 2022-07-03 LAB — URINALYSIS, ROUTINE W REFLEX MICROSCOPIC
Bilirubin Urine: NEGATIVE
Glucose, UA: NEGATIVE mg/dL
Ketones, ur: NEGATIVE mg/dL
Leukocytes,Ua: NEGATIVE
Nitrite: NEGATIVE
Protein, ur: NEGATIVE mg/dL
Specific Gravity, Urine: 1.011 (ref 1.005–1.030)
pH: 6 (ref 5.0–8.0)

## 2022-07-03 LAB — COMPREHENSIVE METABOLIC PANEL
ALT: 13 U/L (ref 0–44)
AST: 20 U/L (ref 15–41)
Albumin: 4.4 g/dL (ref 3.5–5.0)
Alkaline Phosphatase: 48 U/L (ref 38–126)
Anion gap: 8 (ref 5–15)
BUN: 12 mg/dL (ref 6–20)
CO2: 23 mmol/L (ref 22–32)
Calcium: 9 mg/dL (ref 8.9–10.3)
Chloride: 105 mmol/L (ref 98–111)
Creatinine, Ser: 0.87 mg/dL (ref 0.44–1.00)
GFR, Estimated: >60 mL/min (ref >60)
Glucose, Bld: 147 mg/dL — ABNORMAL HIGH (ref 70–99)
Potassium: 3.6 mmol/L (ref 3.5–5.1)
Sodium: 136 mmol/L (ref 135–145)
Total Bilirubin: 0.8 mg/dL (ref 0.3–1.2)
Total Protein: 7.6 g/dL (ref 6.5–8.1)

## 2022-07-03 LAB — CHLAMYDIA/NGC RT PCR (ARMC ONLY)
Chlamydia Tr: NOT DETECTED
N gonorrhoeae: NOT DETECTED

## 2022-07-03 LAB — WET PREP, GENITAL
Sperm: NONE SEEN
Trich, Wet Prep: NONE SEEN
WBC, Wet Prep HPF POC: <10 (ref <10)
Yeast Wet Prep HPF POC: NONE SEEN

## 2022-07-03 LAB — CBC
HCT: 40.3 % (ref 36.0–46.0)
Hemoglobin: 12.7 g/dL (ref 12.0–15.0)
MCH: 26.3 pg (ref 26.0–34.0)
MCHC: 31.5 g/dL (ref 30.0–36.0)
MCV: 83.4 fL (ref 80.0–100.0)
Platelets: 256 10*3/uL (ref 150–400)
RBC: 4.83 MIL/uL (ref 3.87–5.11)
RDW: 14.7 % (ref 11.5–15.5)
WBC: 6.6 10*3/uL (ref 4.0–10.5)
nRBC: 0 % (ref 0.0–0.2)

## 2022-07-03 LAB — ABO/RH: ABO/RH(D): O POS

## 2022-07-03 LAB — PREGNANCY, URINE: Preg Test, Ur: NEGATIVE

## 2022-07-03 LAB — HCG, QUANTITATIVE, PREGNANCY: hCG, Beta Chain, Quant, S: <1 m[IU]/mL (ref <5)

## 2022-07-03 MED ORDER — LIDOCAINE 5 % EX PTCH
1.0000 | MEDICATED_PATCH | Freq: Once | CUTANEOUS | Status: DC
  Administered 2022-07-03: 1 via TRANSDERMAL
  Filled 2022-07-03: qty 1

## 2022-07-03 MED ORDER — LIDOCAINE 5 % EX PTCH: 1.0000 | MEDICATED_PATCH | Freq: Two times a day (BID) | CUTANEOUS | 0 refills | Status: AC

## 2022-07-03 MED ORDER — ACETAMINOPHEN 500 MG PO TABS
1000.0000 mg | ORAL_TABLET | Freq: Once | ORAL | Status: AC
  Administered 2022-07-03: 1000 mg via ORAL
  Filled 2022-07-03: qty 2

## 2022-07-03 MED ORDER — IBUPROFEN 600 MG PO TABS: 600.0000 mg | ORAL_TABLET | Freq: Three times a day (TID) | ORAL | 0 refills | Status: AC | PRN

## 2022-07-03 MED ORDER — METRONIDAZOLE 500 MG PO TABS: 500.0000 mg | ORAL_TABLET | Freq: Two times a day (BID) | ORAL | 0 refills | Status: AC

## 2022-07-03 NOTE — ED Provider Notes (Signed)
[facility] Provider Note    Event Date/Time   First MD Initiated Contact with Patient 07/03/22 1346     (approximate)   History   Vaginal Bleeding   HPI  Tina Parrish is a 37 y.o. female who comes in with concerns for vaginal bleeding and lower back pain since yesterday.  Patient reports 5 prior pregnancies with 4 live births and 1 miscarriage.  Denies any history of ectopic pregnancy.  She states that she typically has regular periods but has not taken a pregnancy test recently.  She reports a normal period back in March but then only having a day menstruation in April which is atypical for her.  Yesterday she developed some vaginal bleeding initially just some spotting now developing more clots.  States that she does even have a pad and though in her underwear is not soaked.  Denies it being more than a pad an hour.  Denies any new partners.   Physical Exam   Triage Vital Signs: ED Triage Vitals  Enc Vitals Group     BP 07/03/22 1336 134/89     Pulse Rate 07/03/22 1336 84     Resp 07/03/22 1336 16     Temp 07/03/22 1336 98.3 F (36.8 C)     Temp Source 07/03/22 1336 Oral     SpO2 07/03/22 1336 100 %     Weight 07/03/22 1337 146 lb (66.2 kg)     Height 07/03/22 1337 5\' 1"  (1.549 m)     Head Circumference --      Peak Flow --      Pain Score 07/03/22 1336 7     Pain Loc --      Pain Edu? --      Excl. in GC? --     Most recent vital signs: Vitals:   07/03/22 1336  BP: 134/89  Pulse: 84  Resp: 16  Temp: 98.3 F (36.8 C)  SpO2: 100%     General: Awake, no distress.  CV:  Good peripheral perfusion.  Resp:  Normal effort.  Abd:  No distention.  Other:  3 fox swabs worth of blood.  No foreign body noted.  No obvious adnexal tenderness or CMT   ED Results / Procedures / Treatments   Labs (all labs ordered are listed, but only abnormal results are displayed) Labs Reviewed  COMPREHENSIVE METABOLIC PANEL - Abnormal; Notable for the  following components:      Result Value   Glucose, Bld 147 (*)    All other components within normal limits  URINALYSIS, ROUTINE W REFLEX MICROSCOPIC - Abnormal; Notable for the following components:   Color, Urine YELLOW (*)    APPearance CLEAR (*)    Hgb urine dipstick MODERATE (*)    Bacteria, UA RARE (*)    All other components within normal limits  WET PREP, GENITAL  CHLAMYDIA/NGC RT PCR ([facility] ONLY)            CBC  HCG, QUANTITATIVE, PREGNANCY  PREGNANCY, URINE  ABO/RH     EKG  My interpretation of EKG:  Normal sinus rate 69 without any ST elevation or T wave inversions, normal intervals  RADIOLOGY I have reviewed the ultrasound personally interpreted and no signs of torsion  PROCEDURES:  Critical Care performed: No  Procedures   MEDICATIONS ORDERED IN ED: Medications  lidocaine (LIDODERM) 5 % 1 patch (1 patch Transdermal Patch Applied 07/03/22 1629)  acetaminophen (TYLENOL) tablet 1,000 mg (1,000 mg Oral  Given 07/03/22 1630)     IMPRESSION / MDM / ASSESSMENT AND PLAN / ED COURSE  I reviewed the triage vital signs and the nursing notes.   Patient's presentation is most consistent with acute presentation with potential threat to life or bodily function.   Patient comes in with vaginal bleeding, lower back pain.  Preg test was negative therefore ectopic seems less likely I did order ultrasound to rule out torsion given she reports a lot of discomfort.  Patient was given some Tylenol, lidocaine patch.  Her ultrasound was reassuring does show some signs of numerous small follicles.  She denies ever being told this previously she denies getting IVF.  She is to follow this up with OB/GYN to discuss PCOS testing.  Given her pain has resolved with lidocaine and Tylenol I do not feel that we need to do OCPs at this time unless she really wanted to and she would prefer to follow-up outpatient with OB/GYN.  Her bleeding is very minimal and her CBC shows stable hemoglobin.  STD  testing negative patient is positive for bacterial vaginosis we will provide treatment discussed not drinking with it.  Urine without evidence of UTI.  CMP negative.   On repeat evaluation patient denies any pain do not feel we need to do any CT imaging low suspicion for appendicitis or other acute abdominal pathology.  Suspect this is related to menstruations and she will follow-up outpatient with OB/GYN we discussed return precautions in regards to bleeding.  FINAL CLINICAL IMPRESSION(S) / ED DIAGNOSES   Final diagnoses:  Vaginal bleeding  Bacterial vaginosis     Rx / DC Orders   ED Discharge Orders          Ordered    metroNIDAZOLE (FLAGYL) 500 MG tablet  2 times daily        07/03/22 1756    ibuprofen (ADVIL) 600 MG tablet  Every 8 hours PRN        07/03/22 1758    lidocaine (LIDODERM) 5 %  Every 12 hours        07/03/22 1758             Note:  This document was prepared using Dragon voice recognition software and may include unintentional dictation errors.   Concha Se, MD 07/03/22 [phone]

## 2022-07-03 NOTE — Discharge Instructions (Addendum)
We started you on some antibiotics for bacterial vaginosis. Do not drink etoh.  You should call the OB/GYN numbers for follow-up.  If you develop worsening bleeding he can was return to the ER for reevaluation could consider starting hormone medications if this is PCOS this could help with your symptoms in the meantime you can take ibuprofen 600 to help with pain and the lidocaine patches.  Return to the ER if develop worsening symptoms, fevers or any other other concerns    IMPRESSION: 1. No evidence of ovarian torsion. 2. Numerous small follicles predominantly positioned in the peripheral aspect of the bilateral ovaries. Findings can be seen with polycystic ovarian syndrome in the appropriate clinical setting. 3. Unremarkable appearance of the uterus and endometrium.

## 2022-07-03 NOTE — ED Triage Notes (Signed)
Pt in via POV, reports suspected pregnancy, started w/ vaginal bleeding and lower back pain yesterday.  Now reports that when she stands, feels as is she is going to pass out.  Vitals WDL, NAD noted at this time.

## 2022-11-10 ENCOUNTER — Ambulatory Visit: Payer: Medicaid Other

## 2022-11-10 VITALS — BP 123/80 | Ht 61.0 in | Wt 150.0 lb

## 2022-11-10 DIAGNOSIS — Z309 Encounter for contraceptive management, unspecified: Secondary | ICD-10-CM | POA: Diagnosis not present

## 2022-11-10 DIAGNOSIS — Z3201 Encounter for pregnancy test, result positive: Secondary | ICD-10-CM

## 2022-11-10 LAB — PREGNANCY, URINE: Preg Test, Ur: POSITIVE — AB

## 2022-11-10 MED ORDER — PRENATAL 27-0.8 MG PO TABS
1.0000 | ORAL_TABLET | Freq: Every day | ORAL | Status: DC
Start: 2022-11-10 — End: 2023-01-07

## 2022-11-10 NOTE — Progress Notes (Signed)
UPT positive. Plans on prenatal care at [facility]. Sent to clerical for preadmit. Consulted with provider about nausea/vomiting and history of vomiting. Provider recommends Unisom and Vit B6 . Also advised if having weightloss to go to urgent care/ED . Pt is not having any weight loss states she has gained 4lbs . Pt states she usually comes here initially for prenatal care.

## 2022-11-17 ENCOUNTER — Telehealth: Payer: Self-pay

## 2022-11-17 NOTE — Telephone Encounter (Signed)
TC to patient to let her know that due to her high risk medical history she will need to receive her pregnancy care at [facility] or [facility]. TC to [facility] to make sure they take her medicaid and per clerical [facility] they do take her medicaid. Per [facility], they have appointments available. Patient can call for appointment.. LM with number to call.Burt Knack, RN

## 2022-11-18 NOTE — Telephone Encounter (Signed)
Client with 11/26/22 new OB appt and due to high risk medical history unable to receive prenatal care at [facility]. Left message with above information on voicemail in addition to information that [facility] has available appts and accepts her Medicaid [facility] does not accept her Medicaid). Client can also receive prenatal care at [facility]. Requested she call [employer] and ask for Katie or Aniyia Rane. Number to call provided. Jossie Ng, RN

## 2022-11-19 NOTE — Telephone Encounter (Signed)
Call to client to counsel her unable to receive prenatal care at [facility] due to high risk medical history. During call, noticed client has 11/22/22 OB Nurse Intake appt and 11/24/22 has new OB appt with provider. Left message that now realize she has appts scheduled with [facility] and wished the best during her pregnancy. Jossie Ng, RN

## 2022-11-22 ENCOUNTER — Ambulatory Visit (INDEPENDENT_AMBULATORY_CARE_PROVIDER_SITE_OTHER): Payer: Medicaid Other

## 2022-11-22 VITALS — Wt 150.0 lb

## 2022-11-22 DIAGNOSIS — Z3480 Encounter for supervision of other normal pregnancy, unspecified trimester: Secondary | ICD-10-CM | POA: Insufficient documentation

## 2022-11-22 DIAGNOSIS — Z369 Encounter for antenatal screening, unspecified: Secondary | ICD-10-CM

## 2022-11-22 DIAGNOSIS — Z3689 Encounter for other specified antenatal screening: Secondary | ICD-10-CM

## 2022-11-22 NOTE — Progress Notes (Signed)
The patient contacted centralized scheduling is scheduled for dating and New ob 10/10

## 2022-11-22 NOTE — Progress Notes (Signed)
New OB Intake  I connected with  Tina Parrish on 11/22/22 at  1:15 PM EDT by telephone  and verified that I am speaking with the correct person using two identifiers. Nurse is located at [facility] and pt is located at home.  I explained I am completing New OB Intake today. We discussed her EDD of 06/07/2023 that is based on LMP of 08/31/2022. Pt is G6/P4014. I reviewed her allergies, medications, Medical/Surgical/OB history, and appropriate screenings. There are no cats in the home.  Based on history, this is a/an pregnancy uncomplicated .   Patient Active Problem List   Diagnosis Date Noted   Supervision of other normal pregnancy, antepartum 11/22/2022   Smoker 11/30/2021   Kidney stone complicating pregnancy 03/05/2020   History of substance abuse (HCC) 12/27/2019   Marijuana abuse 12/27/2019   Mild intermittent asthma without complication 12/27/2019   Positive urine drug screen 10/29/19 MJ 11/06/2019   UTI (urinary tract infection) during pregnancy 10/29/19 E.Coli 50-100,000 & Serratia Marcescens 11/06/2019   Asthma 10/29/2019   GERD (gastroesophageal reflux disease) 10/29/2019   Gonorrhea and chlamydia (treated 10/2019) 10/29/2019   Abnormal Pap smear of cervix 12/18/2018   Former smoker (last use 09/2019) 12/08/2018   Syphilis, 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) 06/14/2018   Chronic hypertension 10/14/2014   BMI 31.0-31.9,adult 04/12/2014    Concerns addressed today None  Delivery Plans:  Plans to deliver at [facility] as far as she knows now.  Pt aware our providers only deliver at [facility] and if she goes elsewhere she will be delivered by someone she hasn't met before.  Anatomy US Explained first scheduled [place] will be scheduled soon and an anatomy scan will be done at 20 weeks.  Labs Discussed genetic screening with patient. Patient declines genetic testing to be drawn at new OB visit. Discussed possible labs  to be drawn at new OB appointment.  COVID Vaccine Patient has not had COVID vaccine.   Social Determinants of Health Food Insecurity: denies food insecurity Transportation: Patient expressed transportation needs. Childcare: Discussed no children allowed at ultrasound appointments.   First visit review I reviewed new OB appt with pt. I explained she will have ob bloodwork and pap smear/pelvic exam if indicated. Explained pt will be seen by an Paula Compton, CNM at first visit; encounter routed to appropriate provider.   Loran Senters, [place] 11/22/2022  2:03 PM

## 2022-11-22 NOTE — Patient Instructions (Signed)
Second Trimester of Pregnancy  The second trimester of pregnancy is from week 13 through week 27. This is months 4 through 6 of pregnancy. The second trimester is often a time when you feel your best. Your body has adjusted to being pregnant, and you begin to feel better physically. During the second trimester: Morning sickness has lessened or stopped completely. You may have more energy. You may have an increase in appetite. The second trimester is also a time when the unborn baby (fetus) is growing rapidly. At the end of the sixth month, the fetus may be up to 12 inches long and weigh about 1 pounds. You will likely begin to feel the baby move (quickening) between 16 and 20 weeks of pregnancy. Body changes during your second trimester Your body continues to go through many changes during your second trimester. The changes vary and generally return to normal after the baby is born. Physical changes Your weight will continue to increase. You will notice your lower abdomen bulging out. You may begin to get stretch marks on your hips, abdomen, and breasts. Your breasts will continue to grow and to become tender. Dark spots or blotches (chloasma or mask of pregnancy) may develop on your face. A dark line from your belly button to the pubic area (linea nigra) may appear. You may have changes in your hair. These can include thickening of your hair, rapid growth, and changes in texture. Some people also have hair loss during or after pregnancy, or hair that feels dry or thin. Health changes You may develop headaches. You may have heartburn. You may develop constipation. You may develop hemorrhoids or swollen, bulging veins (varicose veins). Your gums may bleed and may be sensitive to brushing and flossing. You may urinate more often because the fetus is pressing on your bladder. You may have back pain. This is caused by: Weight gain. Pregnancy hormones that are relaxing the joints in your  pelvis. A shift in weight and the muscles that support your balance. Follow these instructions at home: Medicines Follow your health care provider's instructions regarding medicine use. Specific medicines may be either safe or unsafe to take during pregnancy. Do not take any medicines unless approved by your health care provider. Take a prenatal vitamin that contains at least 600 micrograms (mcg) of folic acid. Eating and drinking Eat a healthy diet that includes fresh fruits and vegetables, whole grains, good sources of protein such as meat, eggs, or tofu, and low-fat dairy products. Avoid raw meat and unpasteurized juice, milk, and cheese. These carry germs that can harm you and your baby. You may need to take these actions to prevent or treat constipation: Drink enough fluid to keep your urine pale yellow. Eat foods that are high in fiber, such as beans, whole grains, and fresh fruits and vegetables. Limit foods that are high in fat and processed sugars, such as fried or sweet foods. Activity Exercise only as directed by your health care provider. Most people can continue their usual exercise routine during pregnancy. Try to exercise for 30 minutes at least 5 days a week. Stop exercising if you develop contractions in your uterus. Stop exercising if you develop pain or cramping in the lower abdomen or lower back. Avoid exercising if it is very hot or humid or if you are at a high altitude. Avoid heavy lifting. If you choose to, you may have sex unless your health care provider tells you not to. Relieving pain and discomfort Wear a supportive   bra to prevent discomfort from breast tenderness. Take warm sitz baths to soothe any pain or discomfort caused by hemorrhoids. Use hemorrhoid cream if your health care provider approves. Rest with your legs raised (elevated) if you have leg cramps or low back pain. If you develop varicose veins: Wear support hose as told by your health care  provider. Elevate your feet for 15 minutes, 3-4 times a day. Limit salt in your diet. Safety Wear your seat belt at all times when driving or riding in a car. Talk with your health care provider if someone is verbally or physically abusive to you. Lifestyle Do not use hot tubs, steam rooms, or saunas. Do not douche. Do not use tampons or scented sanitary pads. Avoid cat litter boxes and soil used by cats. These carry germs that can cause birth defects in the baby and possibly loss of the fetus by miscarriage or stillbirth. Do not use herbal remedies, alcohol, illegal drugs, or medicines that are not approved by your health care provider. Chemicals in these products can harm your baby. Do not use any products that contain nicotine or tobacco, such as cigarettes, e-cigarettes, and chewing tobacco. If you need help quitting, ask your health care provider. General instructions During a routine prenatal visit, your health care provider will do a physical exam and other tests. He or she will also discuss your overall health. Keep all follow-up visits. This is important. Ask your health care provider for a referral to a local prenatal education class. Ask for help if you have counseling or nutritional needs during pregnancy. Your health care provider can offer advice or refer you to specialists for help with various needs. Where to find more information American Pregnancy Association: americanpregnancy.org American College of Obstetricians and Gynecologists: acog.org/en/Womens%20Health/Pregnancy Office on Women's Health: womenshealth.gov/pregnancy Contact a health care provider if you have: A headache that does not go away when you take medicine. Vision changes or you see spots in front of your eyes. Mild pelvic cramps, pelvic pressure, or nagging pain in the abdominal area. Persistent nausea, vomiting, or diarrhea. A bad-smelling vaginal discharge or foul-smelling urine. Pain when you  urinate. Sudden or extreme swelling of your face, hands, ankles, feet, or legs. A fever. Get help right away if you: Have fluid leaking from your vagina. Have spotting or bleeding from your vagina. Have severe abdominal cramping or pain. Have difficulty breathing. Have chest pain. Have fainting spells. Have not felt your baby move for the time period told by your health care provider. Have new or increased pain, swelling, or redness in an arm or leg. Summary The second trimester of pregnancy is from week 13 through week 27 (months 4 through 6). Do not use herbal remedies, alcohol, illegal drugs, or medicines that are not approved by your health care provider. Chemicals in these products can harm your baby. Exercise only as directed by your health care provider. Most people can continue their usual exercise routine during pregnancy. Keep all follow-up visits. This is important. This information is not intended to replace advice given to you by your health care provider. Make sure you discuss any questions you have with your health care provider. Document Revised: 07/11/2019 Document Reviewed: 05/17/2019 Elsevier Patient Education  2024 [employer] First Trimester of Pregnancy  The first trimester of pregnancy starts on the first day of your last menstrual period until the end of week 12. This is also called months 1 through 3 of pregnancy. Body changes during your first trimester Your   body goes through many changes during pregnancy. The changes usually return to normal after your baby is born. Physical changes You may gain or lose weight. Your breasts may grow larger and hurt. The area around your nipples may get darker. Dark spots or blotches may develop on your face. You may have changes in your hair. Health changes You may feel like you might vomit (nauseous), and you may vomit. You may have heartburn. You may have headaches. You may have trouble pooping (constipation). Your  gums may bleed. Other changes You may get tired easily. You may pee (urinate) more often. Your menstrual periods will stop. You may not feel hungry. You may want to eat certain kinds of food. You may have changes in your emotions from day to day. You may have more dreams. Follow these instructions at home: Medicines Take over-the-counter and prescription medicines only as told by your doctor. Some medicines are not safe during pregnancy. Take a prenatal vitamin that contains at least 600 micrograms (mcg) of folic acid. Eating and drinking Eat healthy meals that include: Fresh fruits and vegetables. Whole grains. Good sources of protein, such as meat, eggs, or tofu. Low-fat dairy products. Avoid raw meat and unpasteurized juice, milk, and cheese. If you feel like you may vomit, or you vomit: Eat 4 or 5 small meals a day instead of 3 large meals. Try eating a few soda crackers. Drink liquids between meals instead of during meals. You may need to take these actions to prevent or treat trouble pooping: Drink enough fluids to keep your pee (urine) pale yellow. Eat foods that are high in fiber. These include beans, whole grains, and fresh fruits and vegetables. Limit foods that are high in fat and sugar. These include fried or sweet foods. Activity Exercise only as told by your doctor. Most people can do their usual exercise routine during pregnancy. Stop exercising if you have cramps or pain in your lower belly (abdomen) or low back. Do not exercise if it is too hot or too humid, or if you are in a place of great height (high altitude). Avoid heavy lifting. If you choose to, you may have sex unless your doctor tells you not to. Relieving pain and discomfort Wear a good support bra if your breasts are sore. Rest with your legs raised (elevated) if you have leg cramps or low back pain. If you have bulging veins (varicose veins) in your legs: Wear support hose as told by your  doctor. Raise your feet for 15 minutes, 3-4 times a day. Limit salt in your food. Safety Wear your seat belt at all times when you are in a car. Talk with your doctor if someone is hurting you or yelling at you. Talk with your doctor if you are feeling sad or have thoughts of hurting yourself. Lifestyle Do not use hot tubs, steam rooms, or saunas. Do not douche. Do not use tampons or scented sanitary pads. Do not use herbal medicines, illegal drugs, or medicines that are not approved by your doctor. Do not drink alcohol. Do not smoke or use any products that contain nicotine or tobacco. If you need help quitting, ask your doctor. Avoid cat litter boxes and soil that is used by cats. These carry germs that can cause harm to the baby and can cause a loss of your baby by miscarriage or stillbirth. General instructions Keep all follow-up visits. This is important. Ask for help if you need counseling or if you need help with   nutrition. Your doctor can give you advice or tell you where to go for help. Visit your dentist. At home, brush your teeth with a soft toothbrush. Floss gently. Write down your questions. Take them to your prenatal visits. Where to find more information American Pregnancy Association: americanpregnancy.org American College of Obstetricians and Gynecologists: www.acog.org Office on Women's Health: womenshealth.gov/pregnancy Contact a doctor if: You are dizzy. You have a fever. You have mild cramps or pressure in your lower belly. You have a nagging pain in your belly area. You continue to feel like you may vomit, you vomit, or you have watery poop (diarrhea) for 24 hours or longer. You have a bad-smelling fluid coming from your vagina. You have pain when you pee. You are exposed to a disease that spreads from person to person, such as chickenpox, measles, Zika virus, HIV, or hepatitis. Get help right away if: You have spotting or bleeding from your vagina. You have  very bad belly cramping or pain. You have shortness of breath or chest pain. You have any kind of injury, such as from a fall or a car crash. You have new or increased pain, swelling, or redness in an arm or leg. Summary The first trimester of pregnancy starts on the first day of your last menstrual period until the end of week 12 (months 1 through 3). Eat 4 or 5 small meals a day instead of 3 large meals. Do not smoke or use any products that contain nicotine or tobacco. If you need help quitting, ask your doctor. Keep all follow-up visits. This information is not intended to replace advice given to you by your health care provider. Make sure you discuss any questions you have with your health care provider. Document Revised: 07/11/2019 Document Reviewed: 05/17/2019 Elsevier Patient Education  2024 [employer] Commonly Asked Questions During Pregnancy  Cats: A parasite can be excreted in cat feces.  To avoid exposure you need to have another person empty the little box.  If you must empty the litter box you will need to wear gloves.  Wash your hands after handling your cat.  This parasite can also be found in raw or undercooked meat so this should also be avoided.  Colds, Sore Throats, Flu: Please check your medication sheet to see what you can take for symptoms.  If your symptoms are unrelieved by these medications please call the office.  Dental Work: Most any dental work your dentist recommends is permitted.  X-rays should only be taken during the first trimester if absolutely necessary.  Your abdomen should be shielded with a lead apron during all x-rays.  Please notify your provider prior to receiving any x-rays.  Novocaine is fine; gas is not recommended.  If your dentist requires a note from us prior to dental work please call the office and we will provide one for you.  Exercise: Exercise is an important part of staying healthy during your pregnancy.  You may continue most exercises  you were accustomed to prior to pregnancy.  Later in your pregnancy you will most likely notice you have difficulty with activities requiring balance like riding a bicycle.  It is important that you listen to your body and avoid activities that put you at a higher risk of falling.  Adequate rest and staying well hydrated are a must!  If you have questions about the safety of specific activities ask your provider.    Exposure to Children with illness: Try to avoid obvious exposure; report any   symptoms to us when noted,  If you have chicken pos, red measles or mumps, you should be immune to these diseases.   Please do not take any vaccines while pregnant unless you have checked with your OB provider.  Fetal Movement: After 28 weeks we recommend you do "kick counts" twice daily.  Lie or sit down in a calm quiet environment and count your baby movements "kicks".  You should feel your baby at least 10 times per hour.  If you have not felt 10 kicks within the first hour get up, walk around and have something sweet to eat or drink then repeat for an additional hour.  If count remains less than 10 per hour notify your provider.  Fumigating: Follow your pest control agent's advice as to how long to stay out of your home.  Ventilate the area well before re-entering.  Hemorrhoids:   Most over-the-counter preparations can be used during pregnancy.  Check your medication to see what is safe to use.  It is important to use a stool softener or fiber in your diet and to drink lots of liquids.  If hemorrhoids seem to be getting worse please call the office.   Hot Tubs:  Hot tubs Jacuzzis and saunas are not recommended while pregnant.  These increase your internal body temperature and should be avoided.  Intercourse:  Sexual intercourse is safe during pregnancy as long as you are comfortable, unless otherwise advised by your provider.  Spotting may occur after intercourse; report any bright red bleeding that is heavier  than spotting.  Labor:  If you know that you are in labor, please go to the hospital.  If you are unsure, please call the office and let us help you decide what to do.  Lifting, straining, etc:  If your job requires heavy lifting or straining please check with your provider for any limitations.  Generally, you should not lift items heavier than that you can lift simply with your hands and arms (no back muscles)  Painting:  Paint fumes do not harm your pregnancy, but may make you ill and should be avoided if possible.  Latex or water based paints have less odor than oils.  Use adequate ventilation while painting.  Permanents & Hair Color:  Chemicals in hair dyes are not recommended as they cause increase hair dryness which can increase hair loss during pregnancy.  " Highlighting" and permanents are allowed.  Dye may be absorbed differently and permanents may not hold as well during pregnancy.  Sunbathing:  Use a sunscreen, as skin burns easily during pregnancy.  Drink plenty of fluids; avoid over heating.  Tanning Beds:  Because their possible side effects are still unknown, tanning beds are not recommended.  Ultrasound Scans:  Routine ultrasounds are performed at approximately 20 weeks.  You will be able to see your baby's general anatomy an if you would like to know the gender this can usually be determined as well.  If it is questionable when you conceived you may also receive an ultrasound early in your pregnancy for dating purposes.  Otherwise ultrasound exams are not routinely performed unless there is a medical necessity.  Although you can request a scan we ask that you pay for it when conducted because insurance does not cover " patient request" scans.  Work: If your pregnancy proceeds without complications you may work until your due date, unless your physician or employer advises otherwise.  Round Ligament Pain/Pelvic Discomfort:  Sharp, shooting pains not associated   with bleeding are  fairly common, usually occurring in the second trimester of pregnancy.  They tend to be worse when standing up or when you remain standing for long periods of time.  These are the result of pressure of certain pelvic ligaments called "round ligaments".  Rest, Tylenol and heat seem to be the most effective relief.  As the womb and fetus grow, they rise out of the pelvis and the discomfort improves.  Please notify the office if your pain seems different than that described.  It may represent a more serious condition.  Common Medications Safe in Pregnancy  Acne:      Constipation:  Benzoyl Peroxide     Colace  Clindamycin      Dulcolax Suppository  Topica Erythromycin     Fibercon  Salicylic Acid      Metamucil         Miralax AVOID:        Senakot   Accutane    Cough:  Retin-A       Cough Drops  Tetracycline      Phenergan w/ Codeine if Rx  Minocycline      Robitussin (Plain & DM)  Antibiotics:     Crabs/Lice:  Ceclor       RID  Cephalosporins    AVOID:  E-Mycins      Kwell  Keflex  Macrobid/Macrodantin   Diarrhea:  Penicillin      Kao-Pectate  Zithromax      Imodium AD         PUSH FLUIDS AVOID:       Cipro     Fever:  Tetracycline      Tylenol (Regular or Extra  Minocycline       Strength)  Levaquin      Extra Strength-Do not          Exceed 8 tabs/24 hrs Caffeine:        <200mg/day (equiv. To 1 cup of coffee or  approx. 3 12 oz sodas)         Gas: Cold/Hayfever:       Gas-X  Benadryl      Mylicon  Claritin       Phazyme  **Claritin-D        Chlor-Trimeton    Headaches:  Dimetapp      ASA-Free Excedrin  Drixoral-Non-Drowsy     Cold Compress  Mucinex (Guaifenasin)     Tylenol (Regular or Extra  Sudafed/Sudafed-12 Hour     Strength)  **Sudafed PE Pseudoephedrine   Tylenol Cold & Sinus     Vicks Vapor Rub  Zyrtec  **AVOID if Problems With Blood Pressure         Heartburn: Avoid lying down for at least 1 hour after meals  Aciphex      Maalox     Rash:  Milk of  Magnesia     Benadryl    Mylanta       1% Hydrocortisone Cream  Pepcid  Pepcid Complete   Sleep Aids:  Prevacid      Ambien   Prilosec       Benadryl  Rolaids       Chamomile Tea  Tums (Limit 4/day)     Unisom         Tylenol PM         Warm milk-add vanilla or  Hemorrhoids:       Sugar for taste  Anusol/Anusol H.C.  (RX: Analapram 2.5%)  Sugar Substitutes:    Hydrocortisone OTC     Ok in moderation  Preparation H      Tucks        Vaseline lotion applied to tissue with wiping    Herpes:     Throat:  Acyclovir      Oragel  Famvir  Valtrex     Vaccines:         Flu Shot Leg Cramps:       *Gardasil  Benadryl      Hepatitis A         Hepatitis B Nasal Spray:       Pneumovax  Saline Nasal Spray     Polio Booster         Tetanus Nausea:       Tuberculosis test or PPD  Vitamin B6 25 mg TID   AVOID:    Dramamine      *Gardasil  Emetrol       Live Poliovirus  Ginger Root 250 mg QID    MMR (measles, mumps &  High Complex Carbs @ Bedtime    rebella)  Sea Bands-Accupressure    Varicella (Chickenpox)  Unisom 1/2 tab TID     *No known complications           If received before Pain:         Known pregnancy;   Darvocet       Resume series after  Lortab        Delivery  Percocet    Yeast:   Tramadol      Femstat  Tylenol 3      Gyne-lotrimin  Ultram       Monistat  Vicodin           MISC:         All Sunscreens           Hair Coloring/highlights          Insect Repellant's          (Including DEET)         Mystic Tans  

## 2022-11-24 ENCOUNTER — Encounter: Payer: Medicaid Other | Admitting: Obstetrics

## 2022-11-25 ENCOUNTER — Ambulatory Visit
Admission: RE | Admit: 2022-11-25 | Discharge: 2022-11-25 | Disposition: A | Discharge status: Home / Self Care | Payer: Medicaid Other | Source: Ambulatory Visit | Attending: Obstetrics | Admitting: Obstetrics

## 2022-11-25 ENCOUNTER — Other Ambulatory Visit (HOSPITAL_COMMUNITY)
Admission: RE | Admit: 2022-11-25 | Discharge: 2022-11-25 | Disposition: A | Discharge status: Home / Self Care | Payer: Medicaid Other | Source: Ambulatory Visit | Attending: Obstetrics | Admitting: Obstetrics

## 2022-11-25 ENCOUNTER — Ambulatory Visit: Payer: Medicaid Other | Admitting: Obstetrics

## 2022-11-25 ENCOUNTER — Encounter: Payer: Self-pay | Admitting: Obstetrics

## 2022-11-25 ENCOUNTER — Other Ambulatory Visit: Payer: Self-pay | Admitting: Obstetrics

## 2022-11-25 VITALS — BP 120/74 | HR 85 | Wt 152.0 lb

## 2022-11-25 DIAGNOSIS — Z113 Encounter for screening for infections with a predominantly sexual mode of transmission: Secondary | ICD-10-CM | POA: Insufficient documentation

## 2022-11-25 DIAGNOSIS — Q8789 Other specified congenital malformation syndromes, not elsewhere classified: Secondary | ICD-10-CM | POA: Insufficient documentation

## 2022-11-25 DIAGNOSIS — Z369 Encounter for antenatal screening, unspecified: Secondary | ICD-10-CM

## 2022-11-25 DIAGNOSIS — Z3687 Encounter for antenatal screening for uncertain dates: Secondary | ICD-10-CM | POA: Insufficient documentation

## 2022-11-25 DIAGNOSIS — Z124 Encounter for screening for malignant neoplasm of cervix: Secondary | ICD-10-CM

## 2022-11-25 DIAGNOSIS — Z3A08 8 weeks gestation of pregnancy: Secondary | ICD-10-CM | POA: Insufficient documentation

## 2022-11-25 DIAGNOSIS — O021 Missed abortion: Secondary | ICD-10-CM | POA: Diagnosis not present

## 2022-11-25 DIAGNOSIS — Z3A12 12 weeks gestation of pregnancy: Secondary | ICD-10-CM

## 2022-11-25 DIAGNOSIS — Z3480 Encounter for supervision of other normal pregnancy, unspecified trimester: Secondary | ICD-10-CM

## 2022-11-25 NOTE — Progress Notes (Signed)
Obstetrics & Gynecology Office Visit   Chief Complaint:  Chief Complaint  Patient presents with   Initial Prenatal Visit    History of Present Illness: Tina Parrish came to the office today as a new OB visit client. She came directly to our office from the out patient imaging center at [facility]. Per her report to the MA, she is 12 weeks and 2 days pregnant per her reported LMP, but the tech at the hospital mentioned to her that she was 8 weeks and 2 days.   She was not contracepting, and this pregnancy is unplanned.The patient is unemployed. She plans to sign up for [facility].  She admits to occasional cigarette smoking (rare) and to using marijuana. She is a [place] 6 para 4 with hx of 4 SVD. Her Hx is noted for asthma,marijuana and other substance use, hs of STIS (syphilis, GC and CT) She is noted to have a hx of chronic HTN, but she is not taking any medication for this.     Review of Systems:  Review of Systems  Constitutional: Negative.   Eyes: Negative.   Respiratory: Negative.         Asthma, and desires renewal of an inhaler  Cardiovascular: Negative.   Gastrointestinal: Negative.   Genitourinary: Negative.   Musculoskeletal: Negative.   Skin:        Multiple tattoos on arms chest, back extremities  Neurological: Negative.   Endo/Heme/Allergies: Negative.   Psychiatric/Behavioral: Negative.       Past Medical History:  Past Medical History:  Diagnosis Date   Asthma    Depression    Postpartum   History of chlamydia 12/27/2019   History of gonorrhea 12/27/2019   Hypertension affecting pregnancy    chronic    Past Surgical History:  Past Surgical History:  Procedure Laterality Date   denies surgical history     NO PAST SURGERIES      Gynecologic History: Patient's last menstrual period was 08/31/2022 (approximate).  Obstetric History: [id number]  Family History:  Family History  Problem Relation Age of Onset   Hypertension Mother    Diabetes Mother     Healthy Father    Healthy Brother    Hypertension Brother    Asthma Brother    Healthy Brother    Diabetes Maternal Grandmother    Hypertension Maternal Grandmother    Diabetes Maternal Grandfather    Healthy Paternal Grandmother    Healthy Paternal Grandfather    Asthma Son    Diabetes Maternal Aunt     Social History:  Social History   Socioeconomic History   Marital status: Single    Spouse name: Not on file   Number of children: 4   Years of education: 10   Highest education level: Not on file  Occupational History   Occupation: stay at home Mom    Comment: Homemaker  Tobacco Use   Smoking status: Former    Current packs/day: 0.10    Types: Cigarettes   Smokeless tobacco: Never   Tobacco comments:    denies exposure to secondhand smoke  Vaping Use   Vaping status: Never Used  Substance and Sexual Activity   Alcohol use: Not Currently    Alcohol/week: 3.0 standard drinks of alcohol    Types: 3 Shots of liquor per week    Comment: q weekend   Drug use: Not Currently    Types: Cocaine, Marijuana    Comment: Last Use 2020   Sexual activity: Yes  Partners: Female, Female  Other Topics Concern   Not on file  Social History Narrative   Not on file   Social Determinants of Health   Financial Resource Strain: Low Risk  (11/22/2022)   Overall Financial Resource Strain (CARDIA)    Difficulty of Paying Living Expenses: Not hard at all  Food Insecurity: No Food Insecurity (11/22/2022)   Hunger Vital Sign    Worried About Running Out of Food in the Last Year: Never true    Ran Out of Food in the Last Year: Never true  Transportation Needs: Unmet Transportation Needs (11/22/2022)   PRAPARE - Administrator, Civil Service (Medical): Yes    Lack of Transportation (Non-Medical): Yes  Physical Activity: Insufficiently Active (11/22/2022)   Exercise Vital Sign    Days of Exercise per Week: 3 days    Minutes of Exercise per Session: 20 min  Stress: No Stress  Concern Present (11/22/2022)   [employer] of Occupational Health - Occupational Stress Questionnaire    Feeling of Stress : Not at all  Social Connections: Moderately Isolated (11/22/2022)   Social Connection and Isolation Panel [NHANES]    Frequency of Communication with Friends and Family: More than three times a week    Frequency of Social Gatherings with Friends and Family: Once a week    Attends Religious Services: Never    Database administrator or Organizations: No    Attends Banker Meetings: Never    Marital Status: Living with partner  Intimate Partner Violence: Not At Risk (11/22/2022)   Humiliation, Afraid, Rape, and Kick questionnaire    Fear of Current or Ex-Partner: No    Emotionally Abused: No    Physically Abused: No    Sexually Abused: No    Allergies:  No Known Allergies  Medications: Prior to Admission medications   Medication Sig Start Date End Date Taking? Authorizing Provider  Prenatal Vit-Fe Fumarate-FA (MULTIVITAMIN-PRENATAL) 27-0.8 MG TABS tablet Take 1 tablet by mouth daily at 12 noon. 11/10/22 02/18/23 Yes Valetta Close, MD    Physical Exam Vitals:  Vitals:   11/25/22 1317  BP: 120/74  Pulse: 85   Patient's last menstrual period was 08/31/2022 (approximate).  Physical Exam Constitutional:      Appearance: Normal appearance.  HENT:     Head: Normocephalic and atraumatic.  Cardiovascular:     Rate and Rhythm: Normal rate and regular rhythm.     Pulses: Normal pulses.     Heart sounds: Normal heart sounds.  Pulmonary:     Effort: Pulmonary effort is normal.     Breath sounds: Normal breath sounds.  Abdominal:     Palpations: Abdomen is soft.     Comments: Slightly distended, no pain on palpation. No FHTS auscultated  Genitourinary:    General: Normal vulva.     Rectum: Normal.     Comments: No external lesions Scan vaginal discharge. Aptima retrieved. Uterus is slightly enlarged, anteverted, more C/W 8-9 week  size.  Pap retrieved. Musculoskeletal:        General: Normal range of motion.     Cervical back: Normal range of motion and neck supple.  Skin:    General: Skin is warm and dry.     Comments: Multiple tattoos.  Neurological:     General: No focal deficit present.     Mental Status: She is alert and oriented to person, place, and time.  Psychiatric:        Mood and  Affect: Mood normal.        Behavior: Behavior normal.    During the visit , the ultrasound report was sent via message to the AOB office. Per the report  a gestational sac is seen and a fetus of 8 weeks 2 days is noted, but no cardiac motion. A failed pregnancy was diagnosed.  Assessment: 37 y.o. [id number] with a missed abortion noted today on ultrasound New OB visit had already been performed.  Plan: Problem List Items Addressed This Visit   None Visit Diagnoses     Screening for STD (sexually transmitted disease)    -  Primary   Relevant Orders   Cervicovaginal ancillary only   Screening for cervical cancer       Relevant Orders   Cytology - PAP   Missed abortion       Relevant Orders   Beta hCG quant (ref lab)      Much sympathy and condolences shared with this patient. Options for POC briefly described, but due to the surprise that came after her new OB visit had been completed, the patient is being called again after the office consults with an MD regarding her options.She did not answer her phone and a brief VM was left for her as well as the office number  so she can call with her questions. A quant HCG was drawn today.  We will process her pap and the Aptima as an STI screen.

## 2022-11-26 ENCOUNTER — Telehealth: Payer: Self-pay

## 2022-11-26 LAB — BETA HCG QUANT (REF LAB): hCG Quant: 33381 m[IU]/mL

## 2022-11-26 NOTE — Progress Notes (Signed)
As we discussed, can notify of her options and schedule a follow up visit in the office depending on her choice for management.

## 2022-11-26 NOTE — Telephone Encounter (Signed)
Pt had dating scan yesterday before NOB appt and no cardiac activity. MMF called pt after being advised by Dr. Valentino Saxon, no answer LVM. Options were given, let the body do its work naturally, D&C, or vaginal cytotec. Patient calling triage today asking what would be her best option. Can you pls advise?

## 2022-11-29 LAB — CERVICOVAGINAL ANCILLARY ONLY
Bacterial Vaginitis (gardnerella): POSITIVE — AB
Chlamydia: NEGATIVE
Comment: NEGATIVE
Comment: NEGATIVE
Comment: NEGATIVE
Comment: NORMAL
Neisseria Gonorrhea: NEGATIVE
Trichomonas: POSITIVE — AB

## 2022-11-30 ENCOUNTER — Other Ambulatory Visit: Payer: Self-pay | Admitting: Obstetrics

## 2022-11-30 ENCOUNTER — Other Ambulatory Visit: Payer: Self-pay

## 2022-11-30 ENCOUNTER — Other Ambulatory Visit: Payer: Self-pay | Admitting: Obstetrics and Gynecology

## 2022-11-30 ENCOUNTER — Telehealth: Payer: Self-pay

## 2022-11-30 DIAGNOSIS — A599 Trichomoniasis, unspecified: Secondary | ICD-10-CM

## 2022-11-30 DIAGNOSIS — N760 Acute vaginitis: Secondary | ICD-10-CM

## 2022-11-30 DIAGNOSIS — B9689 Other specified bacterial agents as the cause of diseases classified elsewhere: Secondary | ICD-10-CM

## 2022-11-30 LAB — CYTOLOGY - PAP
Comment: NEGATIVE
Diagnosis: NEGATIVE
High risk HPV: NEGATIVE

## 2022-11-30 MED ORDER — IBUPROFEN 800 MG PO TABS: 800.0000 mg | ORAL_TABLET | Freq: Three times a day (TID) | ORAL | 0 refills | Status: DC | PRN

## 2022-11-30 MED ORDER — MISOPROSTOL 200 MCG PO TABS: ORAL_TABLET | ORAL | 1 refills | Status: DC

## 2022-11-30 MED ORDER — METRONIDAZOLE 500 MG PO TABS
500.0000 mg | ORAL_TABLET | Freq: Two times a day (BID) | ORAL | 0 refills | Status: DC
Start: 2022-11-30 — End: 2023-10-21

## 2022-11-30 MED ORDER — OXYCODONE-ACETAMINOPHEN 5-325 MG PO TABS: 1.0000 | ORAL_TABLET | Freq: Four times a day (QID) | ORAL | 0 refills | Status: DC | PRN

## 2022-11-30 NOTE — Progress Notes (Signed)
Tina Parrish called to discuss her management options for MAB. She would like to get the process moving. Reviewed options of D&E versus medication management. She would like to proceed with misoprostol. We discussed the risks, including heavy bleeding, pain, and potential need for D&E if it is not successful. I reviewed proper dosing, danger signs, and when to seek emergency care. Dosha verbalized understanding. Rx sent for misoprostol 800 mcg PV with one refill. Small rx for Percocet for pain management. Plan to follow up in one week or PRN.  Glenetta Borg, CNM

## 2022-11-30 NOTE — Telephone Encounter (Signed)
Pt called office to request Rx for positive BV and trich. Flagyl sent, pt aware.

## 2022-11-30 NOTE — Progress Notes (Signed)
Sent in small  prescription for Percocet as patient desires medical management for AB with Cytotec per request of Guadlupe Spanish, CNM.    Hildred Laser, MD [facility]

## 2022-12-07 ENCOUNTER — Encounter: Payer: Self-pay | Admitting: Obstetrics

## 2022-12-10 ENCOUNTER — Ambulatory Visit: Payer: Medicaid Other | Admitting: Obstetrics

## 2022-12-20 ENCOUNTER — Ambulatory Visit: Payer: Medicaid Other | Admitting: Obstetrics

## 2022-12-31 ENCOUNTER — Ambulatory Visit: Payer: Medicaid Other | Admitting: Obstetrics

## 2023-01-07 ENCOUNTER — Encounter: Payer: Self-pay | Admitting: Obstetrics

## 2023-01-07 ENCOUNTER — Ambulatory Visit: Payer: Medicaid Other | Admitting: Obstetrics

## 2023-01-07 VITALS — BP 112/75 | HR 85 | Resp 16 | Ht 61.0 in | Wt 153.8 lb

## 2023-01-07 DIAGNOSIS — Z09 Encounter for follow-up examination after completed treatment for conditions other than malignant neoplasm: Secondary | ICD-10-CM | POA: Diagnosis not present

## 2023-01-07 DIAGNOSIS — O039 Complete or unspecified spontaneous abortion without complication: Secondary | ICD-10-CM

## 2023-01-07 DIAGNOSIS — N912 Amenorrhea, unspecified: Secondary | ICD-10-CM

## 2023-01-07 DIAGNOSIS — Z3202 Encounter for pregnancy test, result negative: Secondary | ICD-10-CM

## 2023-01-07 DIAGNOSIS — Z3009 Encounter for other general counseling and advice on contraception: Secondary | ICD-10-CM | POA: Diagnosis not present

## 2023-01-07 LAB — POCT URINE PREGNANCY: Preg Test, Ur: NEGATIVE

## 2023-01-07 NOTE — Patient Instructions (Signed)
IUD PLACEMENT POST-PROCEDURE INSTRUCTIONS  You may take Ibuprofen, Aleve or Tylenol for pain if needed.  Cramping should resolve within in 24 hours.  You may have a small amount of spotting.  You should wear a mini pad for the next few days.  You may have intercourse after 24 hours.  If you using this for birth control, it is effective immediately.  You need to call if you have any pelvic pain, fever, heavy bleeding or foul smelling vaginal discharge.  Irregular bleeding is common the first several months after having an IUD placed. You do not need to call for this reason unless you are concerned.  Shower or bathe as normal  You should have a follow-up appointment in 4-8 weeks for a re-check to make sure you are not having any problems.Hormonal Contraception Information Hormonal contraception is a type of birth control that uses hormones to prevent pregnancy. It usually involves a combination of the hormones estrogen and progesterone, or only the hormone progesterone. Hormonal contraception works in these ways: It thickens the mucus in the cervix, which is the lowest part of the uterus. Thicker mucus makes it harder for sperm to enter the uterus. It changes the lining of the uterus. This makes it harder for an egg to attach or implant. It may stop the ovaries from releasing eggs (ovulation). Some women who take hormonal contraceptives that contain only progesterone may continue to ovulate. Hormonal contraception cannot prevent STIs (sexually transmitted infections). Pregnancy may still occur. Types of hormonal contraception  Estrogen and progesterone contraceptives Contraceptives that use a combination of estrogen and progesterone are available in these forms: Pill. Pills come in different combinations of hormones. Pills must be taken at the same time each day. They can affect your period. You can get your period monthly, once every 3 months, or not at all. Patch. The patch is applied to the  buttocks, abdomen, upper outer arm, or back. It is kept in place for 3 weeks. It is removed for the last or fourth week of the cycle. Vaginal ring. The ring is placed in the vagina and left there for 3 weeks. It is then removed for the last or fourth week of the cycle. Progesterone-only contraceptives Contraceptives that use only progesterone are available in these forms: Pill. Pills should be taken at the same time everyday. This is very important to decrease the chance of pregnancy. Pills containing progestin-only are usually taken every day of the cycle. Other types of pills may have a placebo tablet for the last 4 days of every cycle. Intrauterine device (IUD). This device is inserted through the vagina and cervix into the uterus. It is removed or replaced every 3 to 5 years, depending on the type. It can be removed sooner. Implant. Plastic rods are placed under the skin of the upper arm. They are removed or replaced every 3 years. They can be removed sooner. Shot (injection). The injection is given once every 12 or 13 weeks (about 3 months). Risks associated with hormonal contraception Estrogen and progesterone contraceptives can sometimes cause side effects, such as: Nausea. Headaches. Breast tenderness. Bleeding or spotting between menstrual cycles. High blood pressure (rare). Strokes, heart attacks, or blood clots (rare). Progesterone-only contraceptives also can have side effects, such as: Nausea. Headaches. Breast tenderness. Irregular menstrual bleeding. High blood pressure (rare). Talk to your health care provider about what side effects may mean for you. Questions to ask: What type of hormonal contraception is right for me? How long should I  plan to use hormonal contraception? What are the side effects of the hormonal contraception method I choose? How can I prevent STIs while using hormonal contraception? Where to find more information Ask your health care provider for more  information and resources about hormonal contraception. You can also go to: U.S. Department of Health and [employer], Office on Women's Health: http://hoffman.com/ Summary Estrogen and progesterone are hormones used in many forms of birth control. Hormonal contraception cannot prevent STIs (sexually transmitted infections). Talk to your health care provider about what side effects may mean for you. Ask your health care provider for more information and resources about hormonal contraception. This information is not intended to replace advice given to you by your health care provider. Make sure you discuss any questions you have with your health care provider. Document Revised: 10/09/2019 Document Reviewed: 10/10/2019 Elsevier Patient Education  2024 Elsevier Inc. Etonogestrel Implant What is this medication? ETONOGESTREL (et oh noe JES trel) prevents ovulation and pregnancy. It belongs to a group of medications called contraceptives. This medication is a progestin hormone. This medicine may be used for other purposes; ask your health care provider or pharmacist if you have questions. COMMON BRAND NAME(S): Implanon, Nexplanon What should I tell my care team before I take this medication? They need to know if you have any of these conditions: Abnormal vaginal bleeding Blood clots Blood vessel disease Breast, cervical, endometrial, ovarian, liver, or uterine cancer Diabetes Gallbladder disease Heart disease or recent heart attack High blood pressure High cholesterol or triglycerides Kidney disease Liver disease Migraine headaches Seizures Stroke Tobacco use An unusual or allergic reaction to etonogestrel, other medications, foods, dyes, or preservatives Pregnant or trying to get pregnant Breastfeeding How should I use this medication? This device is inserted just under the skin on the inner side of your upper arm by your care team. Talk to your care team about the use of this  medication in children. Special care may be needed. Overdosage: If you think you have taken too much of this medicine contact a poison control center or emergency room at once. NOTE: This medicine is only for you. Do not share this medicine with others. What if I miss a dose? This does not apply. What may interact with this medication? Do not take this medication with any of the following: Amprenavir Fosamprenavir This medication may also interact with the following: Acitretin Aprepitant Armodafinil Bexarotene Bosentan Carbamazepine Certain antivirals for HIV or hepatitis Certain medications for fungal infections, such as fluconazole, ketoconazole, itraconazole, or voriconazole Cyclosporine Felbamate Griseofulvin Lamotrigine Modafinil Oxcarbazepine Phenobarbital Phenytoin Primidone Rifabutin Rifampin Rifapentine St. John's wort Topiramate This list may not describe all possible interactions. Give your health care provider a list of all the medicines, herbs, non-prescription drugs, or dietary supplements you use. Also tell them if you smoke, drink alcohol, or use illegal drugs. Some items may interact with your medicine. What should I watch for while using this medication? Visit your care team for regular checks on your progress. Using this medication does not protect you or your partner against HIV or other sexually transmitted infections (STIs). You should be able to feel the implant by pressing your fingertips over the skin where it was inserted. Contact your care team if you cannot feel the implant, and use a non-hormonal birth control method (such as condoms) until your care team confirms that the implant is in place. Contact your care team if you think that the implant may have broken or become bent while in  your arm. You will receive a user card from your care team after the implant is inserted. The card is a record of the location of the implant in your upper arm and when  it should be removed. Keep this card with your health records. What side effects may I notice from receiving this medication? Side effects that you should report to your care team as soon as possible: Allergic reactions--skin rash, itching, hives, swelling of the face, lips, tongue, or throat Blood clot--pain, swelling, or warmth in the leg, shortness of breath, chest pain Gallbladder problems--severe stomach pain, nausea, vomiting, fever Increase in blood pressure Liver injury--right upper belly pain, loss of appetite, nausea, light-colored stool, dark yellow or brown urine, yellowing skin or eyes, unusual weakness or fatigue New or worsening migraines or headaches Pain, redness, or irritation at injection site Stroke--sudden numbness or weakness of the face, arm, or leg, trouble speaking, confusion, trouble walking, loss of balance or coordination, dizziness, severe headache, change in vision Unusual vaginal discharge, itching, or odor Worsening mood, feelings of depression Side effects that usually do not require medical attention (report to your care team if they continue or are bothersome): Breast pain or tenderness Dark patches of skin on the face or other sun-exposed areas Irregular menstrual cycles or spotting Nausea Weight gain This list may not describe all possible side effects. Call your doctor for medical advice about side effects. You may report side effects to FDA at 1-800-FDA-1088. Where should I keep my medication? This medication is given in a hospital or clinic and will not be stored at home. NOTE: This sheet is a summary. It may not cover all possible information. If you have questions about this medicine, talk to your doctor, pharmacist, or health care provider.  2024 Elsevier/Gold Standard (2021-09-08 00:00:00)

## 2023-01-07 NOTE — Progress Notes (Signed)
Tina Parrish presents for a repeat quantitative HCG. She was diagnosed with a MAB on 11/25/2022 (last month). She decided to use Misoprostol, and then made an appointment for a repeat Quant today. She admits to unprotected IC since she passed the pregnancy.She has not decided on a contraceptive method.she shares that she may be pregnant again. She does not want another pregnancy. She has not had a period since her SAB. She denies any depressive symptoms. She tolerated the cytotec well, and shares that the Motrin helped with the cramping.  O:  Review of Systems  Constitutional: Negative.   HENT: Negative.    Eyes: Negative.   Respiratory: Negative.    Cardiovascular: Negative.   Gastrointestinal: Negative.   Skin: Negative.   Endo/Heme/Allergies: Negative.   All other systems reviewed and are negative.   Physical Exam Constitutional:      Appearance: Normal appearance. She is normal weight.  HENT:     Head: Normocephalic and atraumatic.  Cardiovascular:     Rate and Rhythm: Normal rate and regular rhythm.     Pulses: Normal pulses.     Heart sounds: Normal heart sounds.  Pulmonary:     Effort: Pulmonary effort is normal.     Breath sounds: Normal breath sounds.  Genitourinary:    Comments: Exam deferred. Musculoskeletal:        General: Normal range of motion.     Cervical back: Normal range of motion and neck supple.  Skin:    General: Skin is warm and dry.  Neurological:     General: No focal deficit present.     Mental Status: She is alert and oriented to person, place, and time.  Psychiatric:        Mood and Affect: Mood normal.        Behavior: Behavior normal.     A: follow up on SAB   Needs a contraceptive plan  P: Quantitative HCG drawn today. We spent a good deal of time reviewing contraceptive methods today. A LARC would be appropriate for her. Ultimately she indicates a desire fro the Nexplanon. Will review her Quant and then plan on Nexplanon insertion next week.  Will call her with the results once they are back in. She is educated and consented for the Nexplanon.  Mirna Mires, CNM  01/07/2023 3:26 PM

## 2023-01-08 LAB — BETA HCG QUANT (REF LAB): hCG Quant: 5 m[IU]/mL

## 2023-01-10 ENCOUNTER — Encounter: Payer: Self-pay | Admitting: Obstetrics

## 2023-10-14 ENCOUNTER — Ambulatory Visit (LOCAL_COMMUNITY_HEALTH_CENTER)

## 2023-10-14 VITALS — BP 116/68 | Ht 61.5 in | Wt 146.5 lb

## 2023-10-14 DIAGNOSIS — Z309 Encounter for contraceptive management, unspecified: Secondary | ICD-10-CM

## 2023-10-14 DIAGNOSIS — Z3201 Encounter for pregnancy test, result positive: Secondary | ICD-10-CM | POA: Diagnosis not present

## 2023-10-14 LAB — PREGNANCY, URINE: Preg Test, Ur: POSITIVE — AB

## 2023-10-14 MED ORDER — PRENATAL 27-0.8 MG PO TABS
1.0000 | ORAL_TABLET | Freq: Every day | ORAL | Status: DC
Start: 2023-10-14 — End: 2024-01-10

## 2023-10-14 NOTE — Progress Notes (Signed)
 UPT positive. Unsure of where she would like to establish prenatal care; encouraged to establish care ASAP. List of local providers given to patient.   The patient was dispensed prenatal vitamins #100 today. I provided counseling today regarding the medication. We discussed the medication, the side effects and when to call clinic.   Positive pregnancy packet reviewed and given to patient. Also counseled on hydration and when to seek medical attention.  Patient given the opportunity to ask questions. Questions answered.   Doyce CINDERELLA Shuck, RN

## 2023-10-21 ENCOUNTER — Ambulatory Visit (INDEPENDENT_AMBULATORY_CARE_PROVIDER_SITE_OTHER)

## 2023-10-21 DIAGNOSIS — O09299 Supervision of pregnancy with other poor reproductive or obstetric history, unspecified trimester: Secondary | ICD-10-CM | POA: Insufficient documentation

## 2023-10-21 DIAGNOSIS — Z3687 Encounter for antenatal screening for uncertain dates: Secondary | ICD-10-CM

## 2023-10-21 DIAGNOSIS — Z3480 Encounter for supervision of other normal pregnancy, unspecified trimester: Secondary | ICD-10-CM | POA: Insufficient documentation

## 2023-10-21 DIAGNOSIS — Z3689 Encounter for other specified antenatal screening: Secondary | ICD-10-CM

## 2023-10-21 NOTE — Progress Notes (Signed)
 New OB Intake  I connected with  Tina Parrish on 10/21/23 at  3:15 PM EDT by telephone Visit and verified that I am speaking with the correct person using two identifiers. Nurse is located at [facility] and pt is located at home.  I discussed the limitations, risks, security and privacy concerns of performing an evaluation and management service by telephone and the availability of in person appointments. I also discussed with the patient that there may be a patient responsible charge related to this service. The patient expressed understanding and agreed to proceed.  I explained I am completing New OB Intake today. We discussed her EDD of 05/31/24 that is based on LMP of 08/25/23. Pt is G7/P4. I reviewed her allergies, medications, Medical/Surgical/OB history, and appropriate screenings. There are cats in the home: no. Based on history, this is a/an pregnancy uncomplicated . Her obstetrical history is significant for N/A.  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  Anatomy US  Explained first scheduled US  will be soon. Anatomy US  will be scheduled around [redacted] weeks gestational age.  Labs Discussed genetic screening with patient. Patient desires genetic testing to be drawn at new OB  visit. Discussed possible labs to be drawn at new OB appointment.  COVID Vaccine Patient has not had COVID vaccine.   Social Determinants of Health Food Insecurity: denies food insecurity  Transportation: Patient denies transportation needs. Childcare: Discussed no children allowed at ultrasound appointments.   First visit review I reviewed new OB appt with pt. I explained she will have blood work and pap smear/pelvic exam if indicated. Explained pt will be seen by Eleanor Canny, CNM at first visit; encounter routed to appropriate provider.   Beola Skeens, CMA 10/21/2023  3:43 PM

## 2023-10-21 NOTE — Patient Instructions (Signed)
 First Trimester of Pregnancy  The first trimester of pregnancy starts on the first day of your last monthly period until the end of week 13. This is months 1 through 3 of pregnancy. A week after a sperm fertilizes an egg, the egg will implant into the wall of the uterus and begin to develop into a baby. Body changes during your first trimester Your body goes through many changes during pregnancy. The changes usually return to normal after your baby is born. Physical changes Your breasts may grow larger and may hurt. The area around your nipples may get darker. Your periods will stop. Your hair and nails may grow faster. You may pee more often. Health changes You may tire easily. Your gums may bleed and may be sensitive when you brush and floss. You may not feel hungry. You may have heartburn. You may throw up or feel like you may throw up. You may want to eat some foods, but not others. You may have headaches. You may have trouble pooping (constipation). Other changes Your emotions may change from day to day. You may have more dreams. Follow these instructions at home: Medicines Talk to your health care provider if you're taking medicines. Ask if the medicines are safe to take during pregnancy. Your provider may change the medicines that you take. Do not take any medicines unless told to by your provider. Take a prenatal vitamin that has at least 600 micrograms (mcg) of folic acid. Do not use herbal medicines, illegal substances, or medicines that are not approved by your provider. Eating and drinking While you're pregnant your body needs extra food for your growing baby. Talk with your provider about what to eat while pregnant. Activity Most women are able to exercise during pregnancy. Exercises may need to change as your pregnancy goes on. Talk to your provider about your activities and exercise routines. Relieving pain and discomfort Wear a good, supportive bra if your breasts  hurt. Rest with your legs raised if you have leg cramps or low back pain. Safety Wear your seatbelt at all times when you're in a car. Talk to your provider if someone hits you, hurts you, or yells at you. Talk with your provider if you're feeling sad or have thoughts of hurting yourself. Lifestyle Certain things can be harmful while you're pregnant. Follow these rules: Do not use hot tubs, steam rooms, or saunas. Do not douche. Do not use tampons or scented pads. Do not drink alcohol,smoke, vape, or use products with nicotine or tobacco in them. If you need help quitting, talk with your provider. Avoid cat litter boxes and soil used by cats. These things carry germs that can cause harm to your pregnancy and your baby. General instructions Keep all follow-up visits. It helps you and your unborn baby stay as healthy as possible. Write down your questions. Take them to your visits. Your provider will: Talk with you about your overall health. Give you advice or refer you to specialists who can help with different needs, including: Prenatal education classes. Mental health and counseling. Foods and healthy eating. Ask for help if you need help with food. Call your dentist and ask to be seen. Brush your teeth with a soft toothbrush. Floss gently. Where to find more information American Pregnancy Association: americanpregnancy.org [employer]ians [employer] Gynecologists: acog.org Office on [employer] Health: TravelLesson.ca Contact a health care provider if: You feel dizzy, faint, or have a fever. You vomit or have watery poop (diarrhea) for 2  days or more. You have abnormal discharge or bleeding from your vagina. You have pain when you pee or your pee smells bad. You have cramps, pain, or pressure in your belly area. Get help right away if: You have trouble breathing or chest pain. You have any kind of injury, such as from a fall or a car crash. These symptoms may be an  emergency. Get help right away. Call 911. Do not wait to see if the symptoms will go away. Do not drive yourself to the hospital. This information is not intended to replace advice given to you by your health care provider. Make sure you discuss any questions you have with your health care provider. Document Revised: 11/04/2022 Document Reviewed: 06/04/2022 Elsevier Patient Education  2024 Elsevier Inc.   Common Medications Safe in Pregnancy  Acne:      Constipation:  Benzoyl Peroxide     Colace  Clindamycin      Dulcolax Suppository  Topica Erythromycin     Fibercon  Salicylic Acid      Metamucil         Miralax AVOID:        Senakot   Accutane    Cough:  Retin-A       Cough Drops  Tetracycline      Phenergan w/ Codeine if Rx  Minocycline      Robitussin (Plain & DM)  Antibiotics:     Crabs/Lice:  Ceclor       RID  Cephalosporins    AVOID:  E-Mycins      Kwell  Keflex  Macrobid/Macrodantin   Diarrhea:  Penicillin      Kao-Pectate  Zithromax      Imodium AD         PUSH FLUIDS AVOID:       Cipro     Fever:  Tetracycline      Tylenol (Regular or Extra  Minocycline       Strength)  Levaquin      Extra Strength-Do not          Exceed 8 tabs/24 hrs Caffeine:        200mg /day (equiv. To 1 cup of coffee or  approx. 3 12 oz sodas)         Gas: Cold/Hayfever:       Gas-X  Benadryl      Mylicon  Claritin       Phazyme  **Claritin-D        Chlor-Trimeton    Headaches:  Dimetapp      ASA-Free Excedrin  Drixoral-Non-Drowsy     Cold Compress  Mucinex (Guaifenasin)     Tylenol (Regular or Extra  Sudafed/Sudafed-12 Hour     Strength)  **Sudafed PE Pseudoephedrine   Tylenol Cold & Sinus     Vicks Vapor Rub  Zyrtec  **AVOID if Problems With Blood Pressure         Heartburn: Avoid lying down for at least 1 hour after meals  Aciphex      Maalox     Rash:  Milk of Magnesia     Benadryl    Mylanta       1% Hydrocortisone Cream  Pepcid  Pepcid Complete   Sleep  Aids:  Prevacid      Ambien   Prilosec       Benadryl  Rolaids       Chamomile Tea  Tums (Limit 4/day)     Unisom  Tylenol PM         Warm milk-add vanilla or  Hemorrhoids:       Sugar for taste  Anusol/Anusol H.C.  (RX: Analapram 2.5%)  Sugar Substitutes:  Hydrocortisone OTC     Ok in moderation  Preparation H      Tucks        Vaseline lotion applied to tissue with wiping    Herpes:     Throat:  Acyclovir      Oragel  Famvir  Valtrex     Vaccines:         Flu Shot Leg Cramps:       *Gardasil  Benadryl      Hepatitis A         Hepatitis B Nasal Spray:       Pneumovax  Saline Nasal Spray     Polio Booster         Tetanus Nausea:       Tuberculosis test or PPD  Vitamin B6 25 mg TID   AVOID:    Dramamine      *Gardasil  Emetrol       Live Poliovirus  Ginger Root 250 mg QID    MMR (measles, mumps &  High Complex Carbs @ Bedtime    rebella)  Sea Bands-Accupressure    Varicella (Chickenpox)  Unisom 1/2 tab TID     *No known complications           If received before Pain:         Known pregnancy;   Darvocet       Resume series after  Lortab        Delivery  Percocet    Yeast:   Tramadol      Femstat  Tylenol 3      Gyne-lotrimin  Ultram       Monistat  Vicodin           MISC:         All Sunscreens           Hair Coloring/highlights          Insect Repellant's          (Including DEET)         Mystic Tans   Commonly Asked Questions During Pregnancy   Cats: A parasite can be excreted in cat feces.  To avoid exposure you need to have another person empty the little box.  If you must empty the litter box you will need to wear gloves.  Wash your hands after handling your cat.  This parasite can also be found in raw or undercooked meat so this should also be avoided.  Colds, Sore Throats, Flu: Please check your medication sheet to see what you can take for symptoms.  If your symptoms are unrelieved by these medications please call the office.  Dental Work: Most  any dental work Agricultural consultant recommends is permitted.  X-rays should only be taken during the first trimester if absolutely necessary.  Your abdomen should be shielded with a lead apron during all x-rays.  Please notify your provider prior to receiving any x-rays.  Novocaine is fine; gas is not recommended.  If your dentist requires a note from [place] prior to dental work please call the office and we will provide one for you.  Exercise: Exercise is an important part of staying healthy during your pregnancy.  You may continue most exercises you were accustomed to prior to pregnancy.  Later in your pregnancy you will most likely notice you have difficulty with activities requiring balance like riding a bicycle.  It is important that you listen to your body and avoid activities that put you at a higher risk of falling.  Adequate rest and staying well hydrated are a must!  If you have questions about the safety of specific activities ask your provider.    Exposure to Children with illness: Try to avoid obvious exposure; report any symptoms to [place] when noted,  If you have chicken pos, red measles or mumps, you should be immune to these diseases.   Please do not take any vaccines while pregnant unless you have checked with your OB provider.  Fetal Movement: After 28 weeks we recommend you do "kick counts" twice daily.  Lie or sit down in a calm quiet environment and count your baby movements "kicks".  You should feel your baby at least 10 times per hour.  If you have not felt 10 kicks within the first hour get up, walk around and have something sweet to eat or drink then repeat for an additional hour.  If count remains less than 10 per hour notify your provider.  Fumigating: Follow your pest control agent's advice as to how long to stay out of your home.  Ventilate the area well before re-entering.  Hemorrhoids:   Most over-the-counter preparations can be used during pregnancy.  Check your medication to see what is  safe to use.  It is important to use a stool softener or fiber in your diet and to drink lots of liquids.  If hemorrhoids seem to be getting worse please call the office.   Hot Tubs:  Hot tubs Jacuzzis and saunas are not recommended while pregnant.  These increase your internal body temperature and should be avoided.  Intercourse:  Sexual intercourse is safe during pregnancy as long as you are comfortable, unless otherwise advised by your provider.  Spotting may occur after intercourse; report any bright red bleeding that is heavier than spotting.  Labor:  If you know that you are in labor, please go to the hospital.  If you are unsure, please call the office and let us help you decide what to do.  Lifting, straining, etc:  If your job requires heavy lifting or straining please check with your provider for any limitations.  Generally, you should not lift items heavier than that you can lift simply with your hands and arms (no back muscles)  Painting:  Paint fumes do not harm your pregnancy, but may make you ill and should be avoided if possible.  Latex or water based paints have less odor than oils.  Use adequate ventilation while painting.  Permanents & Hair Color:  Chemicals in hair dyes are not recommended as they cause increase hair dryness which can increase hair loss during pregnancy.  " Highlighting" and permanents are allowed.  Dye may be absorbed differently and permanents may not hold as well during pregnancy.  Sunbathing:  Use a sunscreen, as skin burns easily during pregnancy.  Drink plenty of fluids; avoid over heating.  Tanning Beds:  Because their possible side effects are still unknown, tanning beds are not recommended.  Ultrasound Scans:  Routine ultrasounds are performed at approximately 20 weeks.  You will be able to see your baby's general anatomy an if you would like to know the gender this can usually be determined as well.  If it is questionable when you conceived you may  also  receive an ultrasound early in your pregnancy for dating purposes.  Otherwise ultrasound exams are not routinely performed unless there is a medical necessity.  Although you can request a scan we ask that you pay for it when conducted because insurance does not cover " patient request" scans.  Work: If your pregnancy proceeds without complications you may work until your due date, unless your physician or employer advises otherwise.  Round Ligament Pain/Pelvic Discomfort:  Sharp, shooting pains not associated with bleeding are fairly common, usually occurring in the second trimester of pregnancy.  They tend to be worse when standing up or when you remain standing for long periods of time.  These are the result of pressure of certain pelvic ligaments called "round ligaments".  Rest, Tylenol and heat seem to be the most effective relief.  As the womb and fetus grow, they rise out of the pelvis and the discomfort improves.  Please notify the office if your pain seems different than that described.  It may represent a more serious condition.

## 2023-10-25 ENCOUNTER — Other Ambulatory Visit: Payer: Self-pay | Admitting: Obstetrics

## 2023-10-25 ENCOUNTER — Ambulatory Visit
Admission: RE | Admit: 2023-10-25 | Discharge: 2023-10-25 | Disposition: A | Discharge status: Home / Self Care | Source: Ambulatory Visit | Attending: Obstetrics | Admitting: Obstetrics

## 2023-10-25 DIAGNOSIS — Z3687 Encounter for antenatal screening for uncertain dates: Secondary | ICD-10-CM | POA: Insufficient documentation

## 2023-10-25 DIAGNOSIS — Z3480 Encounter for supervision of other normal pregnancy, unspecified trimester: Secondary | ICD-10-CM

## 2023-10-25 DIAGNOSIS — O09299 Supervision of pregnancy with other poor reproductive or obstetric history, unspecified trimester: Secondary | ICD-10-CM

## 2023-10-31 ENCOUNTER — Encounter: Payer: Self-pay | Admitting: Obstetrics

## 2023-11-09 ENCOUNTER — Ambulatory Visit: Admitting: Pediatrics

## 2023-11-14 ENCOUNTER — Ambulatory Visit (INDEPENDENT_AMBULATORY_CARE_PROVIDER_SITE_OTHER): Admitting: Obstetrics

## 2023-11-14 ENCOUNTER — Other Ambulatory Visit: Payer: Self-pay | Admitting: Obstetrics

## 2023-11-14 ENCOUNTER — Encounter: Payer: Self-pay | Admitting: Obstetrics

## 2023-11-14 ENCOUNTER — Telehealth: Payer: Self-pay

## 2023-11-14 ENCOUNTER — Other Ambulatory Visit (HOSPITAL_COMMUNITY)
Admission: RE | Admit: 2023-11-14 | Discharge: 2023-11-14 | Disposition: A | Discharge status: Home / Self Care | Source: Ambulatory Visit | Attending: Obstetrics | Admitting: Obstetrics

## 2023-11-14 VITALS — BP 92/58 | HR 93 | Ht 61.5 in | Wt 151.5 lb

## 2023-11-14 DIAGNOSIS — Z3A11 11 weeks gestation of pregnancy: Secondary | ICD-10-CM | POA: Diagnosis not present

## 2023-11-14 DIAGNOSIS — Z113 Encounter for screening for infections with a predominantly sexual mode of transmission: Secondary | ICD-10-CM | POA: Insufficient documentation

## 2023-11-14 DIAGNOSIS — Z3481 Encounter for supervision of other normal pregnancy, first trimester: Secondary | ICD-10-CM

## 2023-11-14 DIAGNOSIS — Z1379 Encounter for other screening for genetic and chromosomal anomalies: Secondary | ICD-10-CM

## 2023-11-14 DIAGNOSIS — Z3480 Encounter for supervision of other normal pregnancy, unspecified trimester: Secondary | ICD-10-CM

## 2023-11-14 DIAGNOSIS — Z0283 Encounter for blood-alcohol and blood-drug test: Secondary | ICD-10-CM

## 2023-11-14 DIAGNOSIS — Z0184 Encounter for antibody response examination: Secondary | ICD-10-CM

## 2023-11-14 DIAGNOSIS — D649 Anemia, unspecified: Secondary | ICD-10-CM

## 2023-11-14 DIAGNOSIS — Z13220 Encounter for screening for lipoid disorders: Secondary | ICD-10-CM

## 2023-11-14 DIAGNOSIS — Z131 Encounter for screening for diabetes mellitus: Secondary | ICD-10-CM

## 2023-11-14 MED ORDER — DOXYLAMINE-PYRIDOXINE 10-10 MG PO TBEC: 4.0000 | DELAYED_RELEASE_TABLET | Freq: Every day | ORAL | 1 refills | Status: AC

## 2023-11-14 MED ORDER — ALBUTEROL SULFATE HFA 108 (90 BASE) MCG/ACT IN AERS: 2.0000 | INHALATION_SPRAY | Freq: Four times a day (QID) | RESPIRATORY_TRACT | 2 refills | Status: AC | PRN

## 2023-11-14 MED ORDER — ONDANSETRON 4 MG PO TBDP
4.0000 mg | ORAL_TABLET | Freq: Three times a day (TID) | ORAL | 0 refills | Status: DC | PRN
Start: 2023-11-14 — End: 2024-01-10

## 2023-11-14 MED ORDER — ASPIRIN 81 MG PO TBEC: 81.0000 mg | DELAYED_RELEASE_TABLET | Freq: Every day | ORAL | 2 refills | Status: AC

## 2023-11-14 NOTE — Telephone Encounter (Signed)
 [facility] pharmacy called, had questions about Diclegis directions. Pharmacy aware that pt can start with the recommended dose to see what will work for her.

## 2023-11-14 NOTE — Progress Notes (Signed)
 NEW OB HISTORY AND PHYSICAL  SUBJECTIVE:       Tina Parrish is a 38 y.o. [phone] female, Patient's last menstrual period was 08/25/2023 (approximate)., Estimated Date of Delivery: 05/31/24, [redacted]w[redacted]d, presents today for establishment of Prenatal Care. She has had GHTN in prior pregnancies without progression to preeclampsia. She reports N/V, constipation, GERD, headaches. She has h/o asthma and occasionally needs an inhaler; she does not currently have one. She uses marijuana for nausea relief.  Social history Partner/Relationship: FOB involved Living situation: lives with her 4 children, feels safe there Work: [employer] Exercise: 2x/week Substance use: +MJ   Gynecologic History Patient's last menstrual period was 08/25/2023 (approximate). Last period was late.   Contraception: none Last Pap: 11/25/22. Results were: NILM, negative HPV  Obstetric History OB History  Gravida Para Term Preterm AB Living  7 4 4  0 2 4  SAB IAB Ectopic Multiple Live Births  2 0 0  4    # Outcome Date GA Lbr Len/2nd Weight Sex Type Anes PTL Lv  7 Current           6 SAB 11/25/22 [redacted]w[redacted]d         5 SAB 2023     SAB     4 Term 05/07/20 [redacted]w[redacted]d 02:18 / 00:22 7 lb 3 oz (3.26 kg) M Vag-Spont EPI  LIV  3 Term 10/16/14 [redacted]w[redacted]d / 00:08 6 lb 3.5 oz (2.82 kg) F Vag-Spont EPI  LIV  2 Term 09/12/12 [redacted]w[redacted]d  7 lb 8 oz (3.402 kg) F Vag-Spont  N LIV     Complications: Gestational hypertension, Pregnancy-induced hypertension  1 Term 04/21/10 [redacted]w[redacted]d  7 lb 7.9 oz (3.4 kg) M Vag-Spont  N LIV    Past Medical History:  Diagnosis Date   Asthma    Depression    Postpartum   Gonorrhea and chlamydia (treated 10/2019) 10/29/2019   Treated 10/29/19 w/500mg  ceftrixone and 1gm azithromycin . Partner advised to be treated.  [ ]  TOC  [ ]  3rd trimester screen     History of chlamydia 12/27/2019   History of gonorrhea 12/27/2019   Hypertension affecting pregnancy    chronic    Past Surgical History:  Procedure Laterality Date   NO PAST  SURGERIES      Current Outpatient Medications on File Prior to Visit  Medication Sig Dispense Refill   Prenatal Vit-Fe Fumarate-FA (MULTIVITAMIN-PRENATAL) 27-0.8 MG TABS tablet Take 1 tablet by mouth daily at 12 noon.     No current facility-administered medications on file prior to visit.    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  Tobacco Use   Smoking status: Former    Current packs/day: 0.10    Types: Cigarettes   Smokeless tobacco: Never   Tobacco comments:    denies exposure to secondhand smoke  Vaping Use   Vaping status: Never Used  Substance and Sexual Activity   Alcohol use: Not Currently    Alcohol/week: 3.0 standard drinks of alcohol    Types: 3 Shots of liquor per week    Comment: last ETOH 10/07/2023   Drug use: Not Currently    Types: Cocaine, Marijuana   Sexual activity: Yes    Partners: Male  Birth control/protection: None  Other Topics Concern   Not on file  Social History Narrative   Not on file   Social Drivers of Health   Financial Resource Strain: Low Risk  (10/21/2023)   Overall Financial Resource Strain (CARDIA)    Difficulty of Paying Living Expenses: Not hard at all  Food Insecurity: No Food Insecurity (10/21/2023)   Hunger Vital Sign    Worried About Running Out of Food in the Last Year: Never true    Ran Out of Food in the Last Year: Never true  Transportation Needs: No Transportation Needs (10/21/2023)   PRAPARE - Administrator, Civil Service (Medical): No    Lack of Transportation (Non-Medical): No  Physical Activity: Inactive (10/21/2023)   Exercise Vital Sign    Days of Exercise per Week: 0 days    Minutes of Exercise per Session: 0 min  Stress: No Stress Concern Present (10/21/2023)    [employer] of Occupational Health - Occupational Stress Questionnaire    Feeling of Stress: Not at all  Social Connections: Unknown (10/21/2023)   Social Connection and Isolation Panel    Frequency of Communication with Friends and Family: More than three times a week    Frequency of Social Gatherings with Friends and Family: Three times a week    Attends Religious Services: Never    Active Member of Clubs or Organizations: No    Attends Banker Meetings: Never    Marital Status: Not on file  Intimate Partner Violence: Not At Risk (10/21/2023)   Humiliation, Afraid, Rape, and Kick questionnaire    Fear of Current or Ex-Partner: No    Emotionally Abused: No    Physically Abused: No    Sexually Abused: No    Family History  Problem Relation Age of Onset   Hypertension Mother    Diabetes Mother    Healthy Father    Healthy Brother    Hypertension Brother    Asthma Brother    Healthy Brother    Diabetes Maternal Grandmother    Hypertension Maternal Grandmother    Diabetes Maternal Grandfather    Healthy Paternal Grandmother    Healthy Paternal Grandfather    Asthma Son    Diabetes Maternal Aunt     The following portions of the patient's history were reviewed and updated as appropriate: allergies, current medications, past OB history, past medical history, past surgical history, past family history, past social history, and problem list.  Constitutional: Denied constitutional symptoms, night sweats, recent illness, fatigue, fever, insomnia and weight loss.  Eyes: Denied eye symptoms, eye pain, photophobia, vision change and visual disturbance.  Ears/Nose/Throat/Neck: Denied ear, nose, throat or neck symptoms, hearing loss, nasal discharge, sinus congestion and sore throat.  Cardiovascular: Denied cardiovascular symptoms, arrhythmia, chest pain/pressure, edema, exercise intolerance, orthopnea and palpitations.  Respiratory: Denied pulmonary symptoms, asthma,  pleuritic pain, productive sputum, cough, dyspnea and wheezing.  Gastrointestinal: +N/V, reflux, constipation  Genitourinary: Denied genitourinary symptoms including symptomatic vaginal discharge, pelvic relaxation issues, and urinary complaints.  Musculoskeletal: Denied musculoskeletal symptoms, stiffness, swelling, muscle weakness and myalgia.  Dermatologic: Denied dermatology symptoms, rash and scar.  Neurologic: Denied neurology symptoms, dizziness, headache, neck pain and syncope.  Psychiatric: Denied psychiatric symptoms, anxiety and depression.  Endocrine: Denied endocrine symptoms including hot flashes and night sweats.    Indications for ASA therapy (per uptodate) One of the following: Previous pregnancy with preeclampsia, especially early onset and with an adverse outcome No Multifetal gestation No Chronic hypertension  No Type 1 or 2 diabetes mellitus No Chronic kidney disease No Autoimmune disease (antiphospholipid syndrome, systemic lupus erythematosus) No  Two or more of the following: Nulliparity No Obesity (body mass index >30 kg/m2) No Family history of preeclampsia in mother or sister No Age >=35 years Yes Sociodemographic characteristics (African American race, low socioeconomic level) Yes Personal risk factors (eg, previous pregnancy with low birth weight or small for gestational age infant, previous adverse pregnancy outcome [eg, stillbirth], interval >10 years between pregnancies) Yes   OBJECTIVE: Initial Physical Exam (New OB)  GENERAL APPEARANCE: alert, well appearing HEAD: normocephalic, atraumatic MOUTH: mucous membranes moist, pharynx normal without lesions THYROID: no thyromegaly or masses present BREASTS: patient declined exam LUNGS: clear to auscultation, no wheezes, rales or rhonchi, symmetric air entry HEART: regular rate and rhythm, no murmurs ABDOMEN: soft, nontender, nondistended, no abnormal masses, no epigastric pain and FHT  present EXTREMITIES: no redness or tenderness in the calves or thighs SKIN: normal coloration and turgor, no rashes LYMPH NODES: no adenopathy palpable NEUROLOGIC: alert, oriented, normal speech, no focal findings or movement disorder noted  PELVIC EXAM declined  ASSESSMENT: Normal pregnancy [redacted]w[redacted]d    PLAN: Routine prenatal care. We discussed an overview of prenatal care and when to call. Reviewed diet, exercise, and weight gain recommendations in pregnancy. Discussed benefits of breastfeeding and lactation resources at [facility]. I answered all questions. Labs and genetic screening today. -Counseled THC cessation. Rx sent for Diclegis and Zofran  for nausea -Discussed comfort measures for reflux -LDASA prescribed -Albuterol  inhaler refilled. Discussed maintenance asthma med if needing inhaler frequently.  See orders  Eleanor Canny, CNM

## 2023-11-15 LAB — URINALYSIS, ROUTINE W REFLEX MICROSCOPIC
Bilirubin, UA: NEGATIVE
Glucose, UA: NEGATIVE
Ketones, UA: NEGATIVE
Leukocytes,UA: NEGATIVE
Nitrite, UA: NEGATIVE
Protein,UA: NEGATIVE
RBC, UA: NEGATIVE
Specific Gravity, UA: 1.007 (ref 1.005–1.030)
Urobilinogen, Ur: 0.2 mg/dL (ref 0.2–1.0)
pH, UA: 7.5 (ref 5.0–7.5)

## 2023-11-15 LAB — CERVICOVAGINAL ANCILLARY ONLY
Bacterial Vaginitis (gardnerella): POSITIVE — AB
Candida Glabrata: NEGATIVE
Candida Vaginitis: POSITIVE — AB
Chlamydia: NEGATIVE
Comment: NEGATIVE
Comment: NEGATIVE
Comment: NEGATIVE
Comment: NEGATIVE
Comment: NEGATIVE
Comment: NORMAL
Neisseria Gonorrhea: NEGATIVE
Trichomonas: NEGATIVE

## 2023-11-15 LAB — PROTEIN / CREATININE RATIO, URINE
Creatinine, Urine: 35.5 mg/dL
Protein, Ur: 6.4 mg/dL
Protein/Creat Ratio: 180 mg/g{creat} (ref 0–200)

## 2023-11-16 ENCOUNTER — Encounter: Payer: Self-pay | Admitting: Obstetrics

## 2023-11-16 ENCOUNTER — Other Ambulatory Visit: Payer: Self-pay | Admitting: Obstetrics

## 2023-11-16 LAB — CULTURE, OB URINE

## 2023-11-16 LAB — URINE CULTURE, OB REFLEX: Organism ID, Bacteria: NO GROWTH

## 2023-11-16 MED ORDER — METRONIDAZOLE 500 MG PO TABS: 500.0000 mg | ORAL_TABLET | Freq: Two times a day (BID) | ORAL | 0 refills | Status: DC

## 2023-11-16 NOTE — Progress Notes (Signed)
+  BV/yeast. Rx for metronidazole  500 mg PO BID x 7 days sent to pharmacy. Monistat 7 recommended. Azana notified via MyChart.  M. Justino, CNM

## 2023-11-17 LAB — MONITOR DRUG PROFILE 14(MW)
Amphetamine Scrn, Ur: NEGATIVE ng/mL
BARBITURATE SCREEN URINE: NEGATIVE ng/mL
BENZODIAZEPINE SCREEN, URINE: NEGATIVE ng/mL
Buprenorphine, Urine: NEGATIVE ng/mL
Cocaine (Metab) Scrn, Ur: NEGATIVE ng/mL
Creatinine(Crt), U: 37.8 mg/dL (ref 20.0–300.0)
Fentanyl, Urine: NEGATIVE pg/mL
Meperidine Screen, Urine: NEGATIVE ng/mL
Methadone Screen, Urine: NEGATIVE ng/mL
OXYCODONE+OXYMORPHONE UR QL SCN: NEGATIVE ng/mL
Opiate Scrn, Ur: NEGATIVE ng/mL
Ph of Urine: 7.1 (ref 4.5–8.9)
Phencyclidine Qn, Ur: NEGATIVE ng/mL
Propoxyphene Scrn, Ur: NEGATIVE ng/mL
SPECIFIC GRAVITY: 1.006
Tramadol Screen, Urine: NEGATIVE ng/mL

## 2023-11-17 LAB — CANNABINOID (GC/MS), URINE
Cannabinoid: POSITIVE — AB
Carboxy THC (GC/MS): 127 ng/mL

## 2023-11-17 LAB — NICOTINE SCREEN, URINE: Cotinine Ql Scrn, Ur: NEGATIVE ng/mL

## 2023-11-19 LAB — CBC/D/PLT+RPR+RH+ABO+RUBIGG...
Antibody Screen: NEGATIVE
Basophils Absolute: 0 10*3/uL (ref 0.0–0.2)
Basos: 0 %
EOS (ABSOLUTE): 0 10*3/uL (ref 0.0–0.4)
Eos: 0 %
HCV Ab: NONREACTIVE
HIV Screen 4th Generation wRfx: NONREACTIVE
Hematocrit: 33.7 % — ABNORMAL LOW (ref 34.0–46.6)
Hemoglobin: 10.7 g/dL — ABNORMAL LOW (ref 11.1–15.9)
Hepatitis B Surface Ag: NEGATIVE
Immature Grans (Abs): 0 10*3/uL (ref 0.0–0.1)
Immature Granulocytes: 0 %
Lymphocytes Absolute: 2.3 10*3/uL (ref 0.7–3.1)
Lymphs: 24 %
MCH: 27 pg (ref 26.6–33.0)
MCHC: 31.8 g/dL (ref 31.5–35.7)
MCV: 85 fL (ref 79–97)
Monocytes Absolute: 0.4 10*3/uL (ref 0.1–0.9)
Monocytes: 4 %
Neutrophils Absolute: 6.7 10*3/uL (ref 1.4–7.0)
Neutrophils: 72 %
Platelets: 238 10*3/uL (ref 150–450)
RBC: 3.97 x10E6/uL (ref 3.77–5.28)
RDW: 13 % (ref 11.7–15.4)
RPR Ser Ql: REACTIVE — AB
Rh Factor: POSITIVE
Rubella Antibodies, IGG: 2 {index}
Varicella zoster IgG: REACTIVE
WBC: 9.5 10*3/uL (ref 3.4–10.8)

## 2023-11-19 LAB — HCV INTERPRETATION

## 2023-11-19 LAB — COMPREHENSIVE METABOLIC PANEL WITH GFR
ALT: 16 [IU]/L (ref 0–32)
AST: 16 [IU]/L (ref 0–40)
Albumin: 3.9 g/dL (ref 3.9–4.9)
Alkaline Phosphatase: 46 [IU]/L (ref 41–116)
BUN/Creatinine Ratio: 10 (ref 9–23)
BUN: 7 mg/dL (ref 6–20)
Bilirubin Total: <0.2 mg/dL (ref 0.0–1.2)
CO2: 21 mmol/L (ref 20–29)
Calcium: 9.4 mg/dL (ref 8.7–10.2)
Chloride: 105 mmol/L (ref 96–106)
Creatinine, Ser: 0.69 mg/dL (ref 0.57–1.00)
Globulin, Total: 2.7 g/dL (ref 1.5–4.5)
Glucose: 71 mg/dL (ref 70–99)
Potassium: 4 mmol/L (ref 3.5–5.2)
Sodium: 139 mmol/L (ref 134–144)
Total Protein: 6.6 g/dL (ref 6.0–8.5)
eGFR: 116 mL/min/{1.73_m2}

## 2023-11-19 LAB — MATERNIT 21 PLUS CORE, BLOOD
Fetal Fraction: 17
Result (T21): NEGATIVE
Trisomy 13 (Patau syndrome): NEGATIVE
Trisomy 18 (Edwards syndrome): NEGATIVE
Trisomy 21 (Down syndrome): NEGATIVE

## 2023-11-19 LAB — RPR, QUANT+TP ABS (REFLEX)
Rapid Plasma Reagin, Quant: 1:1 {titer} — ABNORMAL HIGH
T Pallidum Abs: REACTIVE — AB

## 2023-11-19 LAB — HEMOGLOBIN A1C
Est. average glucose Bld gHb Est-mCnc: 108 mg/dL
Hgb A1c MFr Bld: 5.4 % (ref 4.8–5.6)

## 2023-11-19 LAB — TSH+FREE T4
Free T4: 1.23 ng/dL (ref 0.82–1.77)
TSH: 0.021 u[IU]/mL — ABNORMAL LOW (ref 0.450–4.500)

## 2023-12-09 NOTE — Progress Notes (Signed)
    Return Prenatal Note   Assessment/Plan   Plan  38 y.o. [id number] at [redacted]w[redacted]d presents for follow-up OB visit. Reviewed prenatal record including previous visit note.  Supervision of other normal pregnancy, antepartum -Anatomy scan ordered -Would like AFP at next visit -Encouraged to stop eating spicy/fried foods that she's identified as heart burn triggers. May take Pepcid  if dietary changes do not help. -Discussed danger signs and when to go to the hospital    Orders Placed This Encounter  Procedures   US  OB Comp + 14 Wk    Standing Status:   Future    Expiration Date:   02/07/2025    Reason for Exam (SYMPTOM  OR DIAGNOSIS REQUIRED):   anatomy    Preferred Imaging Location?:   Internal   AFP, Serum, Open Spina Bifida    Standing Status:   Future    Expiration Date:   02/11/2024    Is patient insulin dependent?:   No    Gestational Age (GA), weeks:   15    Date on which patient was at this GA:   12/08/2023    GA Calculation Method:   LMP    Number of fetuses:   1   Ambulatory referral to Physical Therapy    Referral Priority:   Routine    Referral Type:   Physical Medicine    Referral Reason:   Specialty Services Required    Requested Specialty:   Physical Therapy    Number of Visits Requested:   1   Return in about 4 weeks (around 01/09/2024).   Future Appointments  Date Time Provider Department Center  01/10/2024  9:15 AM [facility] US  1 [facility] None  01/10/2024 10:15 AM Jayne Harlene CROME, CNM [facility] None     For next visit:  Routine prenatal care    Subjective   Tina Parrish has been having a lot of heart burn. She reports that she eats a lot of spicy foods. She reports frequent stress incontinence. She has started feeling some flutters.  Movement: Present Contractions: Not present  Objective   Flow sheet Vitals: Pulse Rate: 99 BP: 109/78 Fetal Heart Rate (bpm): 155 Total weight gain: 6 lb (2.722 kg)  General Appearance  No acute distress, well  appearing, and well nourished Pulmonary   Normal work of breathing Neurologic   Alert and oriented to person, place, and time Psychiatric   Mood and affect within normal limits  Eleanor Canny, CNM 12/12/23 10:51 AM

## 2023-12-12 ENCOUNTER — Ambulatory Visit (INDEPENDENT_AMBULATORY_CARE_PROVIDER_SITE_OTHER): Admitting: Obstetrics

## 2023-12-12 ENCOUNTER — Encounter: Payer: Self-pay | Admitting: Obstetrics

## 2023-12-12 VITALS — BP 109/78 | HR 99 | Wt 152.0 lb

## 2023-12-12 DIAGNOSIS — Z3A15 15 weeks gestation of pregnancy: Secondary | ICD-10-CM

## 2023-12-12 DIAGNOSIS — Z3689 Encounter for other specified antenatal screening: Secondary | ICD-10-CM

## 2023-12-12 DIAGNOSIS — Z3482 Encounter for supervision of other normal pregnancy, second trimester: Secondary | ICD-10-CM

## 2023-12-12 DIAGNOSIS — Z1379 Encounter for other screening for genetic and chromosomal anomalies: Secondary | ICD-10-CM

## 2023-12-12 DIAGNOSIS — O09299 Supervision of pregnancy with other poor reproductive or obstetric history, unspecified trimester: Secondary | ICD-10-CM

## 2023-12-12 DIAGNOSIS — F1411 Cocaine abuse, in remission: Secondary | ICD-10-CM

## 2023-12-12 DIAGNOSIS — Z3480 Encounter for supervision of other normal pregnancy, unspecified trimester: Secondary | ICD-10-CM

## 2023-12-12 DIAGNOSIS — Z87891 Personal history of nicotine dependence: Secondary | ICD-10-CM

## 2023-12-12 DIAGNOSIS — O99891 Other specified diseases and conditions complicating pregnancy: Secondary | ICD-10-CM

## 2023-12-12 DIAGNOSIS — F1911 Other psychoactive substance abuse, in remission: Secondary | ICD-10-CM

## 2023-12-12 MED ORDER — FAMOTIDINE 20 MG PO TABS: 20.0000 mg | ORAL_TABLET | Freq: Every day | ORAL | 2 refills | Status: DC

## 2023-12-12 NOTE — Assessment & Plan Note (Addendum)
-  Anatomy scan ordered -Would like AFP at next visit -Encouraged to stop eating spicy/fried foods that she's identified as heart burn triggers. May take Pepcid  if dietary changes do not help. -Referral sent to pelvic PT -Discussed danger signs and when to go to the hospital

## 2023-12-20 ENCOUNTER — Other Ambulatory Visit: Payer: Self-pay

## 2023-12-20 ENCOUNTER — Ambulatory Visit: Attending: Obstetrics

## 2023-12-20 DIAGNOSIS — R2689 Other abnormalities of gait and mobility: Secondary | ICD-10-CM | POA: Insufficient documentation

## 2023-12-20 DIAGNOSIS — R293 Abnormal posture: Secondary | ICD-10-CM | POA: Diagnosis present

## 2023-12-20 DIAGNOSIS — N393 Stress incontinence (female) (male): Secondary | ICD-10-CM | POA: Diagnosis present

## 2023-12-20 DIAGNOSIS — O99891 Other specified diseases and conditions complicating pregnancy: Secondary | ICD-10-CM | POA: Diagnosis not present

## 2023-12-20 DIAGNOSIS — M6281 Muscle weakness (generalized): Secondary | ICD-10-CM | POA: Diagnosis present

## 2023-12-20 NOTE — Therapy (Signed)
 OUTPATIENT PHYSICAL THERAPY FEMALE PELVIC EVALUATION   Patient Name: Tina Parrish MRN: [id number] DOB:22-Jun-1985, 38 y.o., female Today's Date: 12/20/2023  END OF SESSION:  PT End of Session - 12/20/23 0856     Visit Number 1    Number of Visits 9    Date for Recertification  02/18/24    Authorization Type Medicaid    Progress Note Due on Visit 10    PT Start Time 0850    PT Stop Time 0930    PT Time Calculation (min) 40 min    Activity Tolerance Patient tolerated treatment well    Behavior During Therapy [facility] for tasks assessed/performed          Past Medical History:  Diagnosis Date   Asthma    Depression    Postpartum   Gonorrhea and chlamydia (treated 10/2019) 10/29/2019   Treated 10/29/19 w/500mg  ceftrixone and 1gm azithromycin . Partner advised to be treated.  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   Smoker 11/30/2021   History of substance abuse (HCC) 12/27/2019   Marijuana abuse 12/27/2019   Mild intermittent asthma without complication 12/27/2019   Asthma 10/29/2019   GERD (gastroesophageal reflux disease) 10/29/2019   Former smoker (last use 09/2019) 12/08/2018   Syphilis, 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) 06/14/2018   Chronic hypertension 10/14/2014   BMI 31.0-31.9,adult 04/12/2014    PCP: Eleanor Canny, CNM  REFERRING PROVIDER: Eleanor Canny, CNM  REFERRING DIAG: SUI for pregnancy  THERAPY DIAG:  SUI (stress urinary incontinence, female)  Other abnormalities of gait and mobility  Muscle weakness  (generalized)  Abnormal posture  Rationale for Evaluation and Treatment: Rehabilitation  ONSET DATE: during pregnancy (last few months)  SUBJECTIVE:                                                                                                                                                                                           SUBJECTIVE STATEMENT: URINARY FUNCTION: approx. 1-2 times an hour while pregnant.  SUI while pregnant (intermittent-three times a week), has a habit of holding her urine. Urgency incontinence occurred a few times. Does not wear pads/panty liners. Pt gets  up at night to urinate (sleeps better when she takes her vitamins) gets up 2-4/night. Pt states urine stream is strong sometimes but not always. No pain with urination. BOWEL FUNCTION: Constipation with pregnancy. Can go a whole week to 1.5 weeks in b/t BMs. Pt reported pain with BM occasionally. Pt does not take fiber supplements or stool softeners.  CORE STABILITY:  no hx of surgeries. Hx of fractured ribs approx. 8 years ago on L side. Pt reported some days she has neck and back pain during pregnancy. At worst: 7/10, at best: 0/10. Certain movements incr pain: how she sits or stands. Helps reduce pain: re-position. SEXUAL FUNCTION: pt reported pain with intercourse (rarely), feels like pelvis is spreading apart with deep penetration. No pain with OBGYN exam and does not use tampons-wears pads. Pt denied issues with climaxing.   Fluid intake: water  or juice (grape, hawaiian punch, calypso, juicy juice), rarely a soda, lately she drinks coffee in the morning (frapp)  FUNCTIONAL LIMITATIONS: unsure  PERTINENT HISTORY:  Medications for current condition: none Surgeries: none Other: Hx of substance abuse (cocaine and marijuana), asthma, GERD, HTN during pregnancy, hx of chlamydia, hx of gonorrhea, depression (postpartum) but says it comes and goes Sexual abuse: No  PAIN:  Are you having pain? No NPRS scale:  0/10 See above  PRECAUTIONS: None but hx of HTN last pregnancy  RED FLAGS: None   WEIGHT BEARING RESTRICTIONS: No  FALLS:  Has patient fallen in last 6 months? 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  ACTIVITY LEVEL : cleaning, cooking, stairs are hard (out of breath), stretching sometimes or squats  PLOF: Independent  PATIENT GOALS: Stop leaking, stop pain (movement is hard)   BOWEL MOVEMENT: Pain with bowel movement: Yes rarely Type of bowel movement:Frequency constipation, 1-1.5 weeks b/t BM Fully empty rectum: No Leakage: No                                                  Caused by:  Bowel urgency: no Pads: No Fiber supplement/laxative No  URINATION: Pain with urination: No Fully empty bladder: No                                         Post-void dribble: No Stream: sometimes strong and weak Urgency: Yes  Frequency:1-2/hour                                                        Nocturia: Yes: 2-4x/night   Leakage: Urge to void, Coughing, Sneezing, Laughing, and Lifting Pads/briefs: No  INTERCOURSE:  Ability to have vaginal penetration Yes  Pain with intercourse: Deep Penetration Dryness: unsure Climax: no issues Marinoff Scale: 2/3  PREGNANCY: Number of pregnancies: 7 Vaginal deliveries 4 (13, 11, 9, 3) 2 boys and 2 girls Tearing No Episiotomy No C-section deliveries: 0  Currently pregnant Yes: 17 weeks on 12/22/23  PROLAPSE: None   OBJECTIVE:  Note: Objective measures were completed at Evaluation unless otherwise noted.   COGNITION: Overall cognitive status: Within functional limits for tasks assessed     SENSATION:  Light touch: Appears intact   FUNCTIONAL TESTS:   Single leg stance:  Rt: incr. Postural sway  Lt: incr. Postural sway   GAIT: Assistive device utilized: None Comments: incr. BOS and decr. Trunk rot  POSTURE: increased lumbar lordosis, decreased thoracic kyphosis, and anterior pelvic  tilt   LUMBARAROM/PROM:  A/PROM A/PROM  Eval (% available)  Flexion   Extension   Right lateral flexion   Left lateral flexion   Right rotation   Left rotation    (Blank rows = not tested)  LOWER EXTREMITY ROM:  Active ROM Right eval Left eval  Hip flexion    Hip extension    Hip abduction    Hip adduction    Hip internal rotation    Hip external rotation    Knee flexion    Knee extension    Ankle dorsiflexion    Ankle plantarflexion    Ankle inversion    Ankle eversion     (Blank rows = not tested)  LOWER EXTREMITY MMT:  MMT Right eval Left eval  Hip flexion    Hip extension    Hip abduction    Hip adduction    Hip internal rotation    Hip external rotation    Knee flexion    Knee extension    Ankle dorsiflexion    Ankle plantarflexion    Ankle inversion    Ankle eversion     (Blank rows = not tested) PALPATION:  General: no TTP during palpation of cx-lx spine in standing.  Pelvic Alignment: ant. Pelvic tilt   All internal or external pelvic floor assessments and/or treatments are completed with proper hand hygiene and gloves hands. If needed gloves are changed with hand hygiene during patient care time.  PELVIC MMT:   MMT eval  Vaginal   Internal Anal Sphincter   External Anal Sphincter   Puborectalis   (Blank rows = not tested)        TONE: limited by time constraints   PROLAPSE: limited by time constraints   TODAY'S TREATMENT:                                                                                                                              DATE: 12/20/23  EVAL   SELF CARE: no charge 2/2 Medicaid PATIENT EDUCATION:  Education details: PT educated pt on main functions of the pelvic floor, IAP, breath and PFM relationship. PT discussed POC, frequency and duration. PT provided the following education: TOILET POSTURE: Urination: feet flat, lean forward with forearms on legs to fully empty bladder. Bowel movement: place  feet flat on Squatty Potty or stool so knees are higher than hips, lean forward to relax pelvic floor in order to avoid strain.  SHOES: wear supportive shoes, and sandals with straps.  POSTURE: try not to cross legs at knees or ankles. Try the figure four stretch instead.  WATER : start with water  first thing in the morning.  PELVIC TILTS: try to stand in neutral, not tucking your tail and not arching back, but in the middle. Bladder diary:   ~Urinating every 2-3 hours ~Urine stream for at least 8 seconds. ~Urine color should be light, lemonade  ~If you have the urge, perform deep breathing for 5 reps and then 5-10 reps of pelvic floor contraction.  ~When do you feel the urge? ~When do you leak? Person educated: Patient Education method: Explanation, Demonstration, and Handouts Education comprehension: verbalized understanding and needs further education  HOME EXERCISE PROGRAM: Not yet established.  ASSESSMENT:  CLINICAL IMPRESSION: Patient is a 38 y.o. female who was seen today for physical therapy evaluation and treatment for SUI and back pain during pregnancy. Pt's PMH is significant for the following: Hx of substance abuse (cocaine and marijuana-not used since 2020), asthma, GERD, HTN during pregnancy, hx of chlamydia, hx of gonorrhea, depression (postpartum) but says it comes and goes. The following impairments were noted upon exam: limited ROM likely 2/2 pregnancy, back/neck pain, postural dysfunction, decr. Strength likely 2/2 subjective reports and gait deviations, nocturia, SUI and rarely urgency incontinence. Pt would benefit from skilled PT to improve safety and decr. Pain during all ADLs.   OBJECTIVE IMPAIRMENTS: Abnormal gait, decreased balance, decreased coordination, decreased endurance, decreased mobility, decreased ROM, decreased strength, hypomobility, increased fascial restrictions, impaired flexibility, improper body mechanics, postural dysfunction, and pain.    ACTIVITY LIMITATIONS: carrying, lifting, bending, sitting, standing, squatting, sleeping, stairs, transfers, bed mobility, continence, toileting, dressing, locomotion level, and caring for others  PARTICIPATION LIMITATIONS: meal prep, cleaning, laundry, interpersonal relationship, shopping, and community activity  PERSONAL FACTORS: Past/current experiences and 3+ comorbidities: see above are also affecting patient's functional outcome.   REHAB POTENTIAL: Good  CLINICAL DECISION MAKING: Evolving/moderate complexity hx of HTN during pregnancy  EVALUATION COMPLEXITY: Moderate   GOALS: Goals reviewed with patient? Yes  SHORT TERM GOALS: Target date: for all STGs: 01/17/24   Pt will be IND in HEP to improve pain, strength, coordination. Baseline: no HEP Goal status: INITIAL  2.  Finish exam and write goals as indicated. Baseline: limited by time constraints Goal status: INITIAL  3.  Pt will demo proper toileting posture to fully empty bladder and reduce straining during bowel movement. Baseline: unable to demo Goal status: INITIAL  4.  Pt will demonstrated improved relaxation and contraction of PFM with coordination of breath to reduce urinary leakage to </=twice/week. Baseline: approx. 3 times/week Goal status: INITIAL  5.   Pt will improve hip and core strength to decr. Back pain to </=4/10 at worst when standing for >10 minutes. Baseline: 7/10 at worst Goal status: INITIAL    LONG TERM GOALS: Target date: for all LTGs: 02/14/24  Pt will demonstrated improved relaxation and contraction of PFM with coordination of breath to reduce urinary leakage to </=once/week. Baseline: approx. 3 times/week Goal status: INITIAL  2.  Pt will demonstrate improved bladder behaviors and improved coordination of PFM with breath to decr. Nocturia for </=1/night. Baseline: 2-4x/night Goal status: INITIAL  3.   Pt will improve hip and core strength to decr. Back pain to </=2/10 at worst  when standing for >10 minutes. Baseline: 7/10 at worst Goal status: INITIAL  4.  Pt will demonstrate improved relaxation and contraction of pelvic floor muscles (PFM) with coordination of breath to decr. Pain with intercourse with partner. Baseline: pain with deep penetration Goal status: INITIAL    PLAN: finish exam (palpation, ROM, MMT, DR) Establish HEP.   PT FREQUENCY: 1x/week  PT DURATION: 8 weeks  PLANNED INTERVENTIONS: 97110-Therapeutic exercises, 97530- Therapeutic activity, [id number]- Neuromuscular re-education, [phone]- Self Care, [id number]- Manual therapy, and Patient/Family education    Pervis Macintyre L, PT 12/20/2023, 8:57 AM  Delon Pinal, PT,DPT 12/20/23 8:57 AM Phone: [phone] Fax: [phone]

## 2023-12-20 NOTE — Patient Instructions (Signed)
 TOILET POSTURE: Urination: feet flat, lean forward with forearms on legs to fully empty bladder. Bowel movement: place feet flat on Squatty Potty or stool so knees are higher than hips, lean forward to relax pelvic floor in order to avoid strain.  SHOES: wear supportive shoes, and sandals with straps.  POSTURE: try not to cross legs at knees or ankles. Try the figure four stretch instead.  WATER : start with water  first thing in the morning.   PELVIC TILTS: try to stand in neutral, not tucking your tail and not arching back, but in the middle.  Bladder diary: monitor for 3-4 days   ~Urinating every 2-3 hours ~Urine stream for at least 8 seconds. ~Urine color should be light, lemonade  ~If you have the urge, perform deep breathing for 5 reps and then 5-10 reps of pelvic floor contraction.  ~When do you feel the urge? ~When do you leak?

## 2023-12-22 ENCOUNTER — Encounter: Payer: Self-pay | Admitting: Certified Nurse Midwife

## 2023-12-22 ENCOUNTER — Ambulatory Visit

## 2023-12-29 ENCOUNTER — Ambulatory Visit

## 2024-01-02 ENCOUNTER — Other Ambulatory Visit: Payer: Self-pay

## 2024-01-02 ENCOUNTER — Ambulatory Visit

## 2024-01-02 DIAGNOSIS — N393 Stress incontinence (female) (male): Secondary | ICD-10-CM | POA: Diagnosis not present

## 2024-01-02 DIAGNOSIS — R293 Abnormal posture: Secondary | ICD-10-CM

## 2024-01-02 DIAGNOSIS — R2689 Other abnormalities of gait and mobility: Secondary | ICD-10-CM

## 2024-01-02 DIAGNOSIS — M6281 Muscle weakness (generalized): Secondary | ICD-10-CM

## 2024-01-02 NOTE — Therapy (Signed)
 OUTPATIENT PHYSICAL THERAPY FEMALE PELVIC TREATMENT   Patient Name: Tina Parrish MRN: [id number] DOB:November 12, 1985, 38 y.o., female Today's Date: 01/02/2024  END OF SESSION:  PT End of Session - 01/02/24 1021     Visit Number 2    Number of Visits 9    Date for Recertification  02/18/24    Authorization Type Medicaid    Progress Note Due on Visit 10    PT Start Time 1017    PT Stop Time 1057    PT Time Calculation (min) 40 min    Activity Tolerance Patient tolerated treatment well    Behavior During Therapy [facility] for tasks assessed/performed          Past Medical History:  Diagnosis Date   Asthma    Depression    Postpartum   Gonorrhea and chlamydia (treated 10/2019) 10/29/2019   Treated 10/29/19 w/500mg  ceftrixone and 1gm azithromycin . Partner advised to be treated.  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   Smoker 11/30/2021   History of substance abuse (HCC) 12/27/2019   Marijuana abuse 12/27/2019   Mild intermittent asthma without complication 12/27/2019   Asthma 10/29/2019   GERD (gastroesophageal reflux disease) 10/29/2019   Former smoker (last use 09/2019) 12/08/2018   Syphilis, 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) 06/14/2018   Chronic hypertension 10/14/2014   BMI 31.0-31.9,adult 04/12/2014    PCP: Eleanor Canny, CNM  REFERRING PROVIDER: Eleanor Canny, CNM  REFERRING DIAG: SUI for pregnancy  THERAPY DIAG:  SUI (stress urinary incontinence, female)  Other abnormalities of gait and mobility  Muscle weakness  (generalized)  Abnormal posture  Rationale for Evaluation and Treatment: Rehabilitation  ONSET DATE: during pregnancy (last few months)  SUBJECTIVE:                                                                                                                                                                                           SUBJECTIVE STATEMENT: Pt unable to sit for too long without pain in knees and shins. Pt also experiencing B hands (similar to CTS).   EVAL: URINARY FUNCTION: approx. 1-2 times an hour while pregnant.  SUI while pregnant (  intermittent-three times a week), has a habit of holding her urine. Urgency incontinence occurred a few times. Does not wear pads/panty liners. Pt gets up at night to urinate (sleeps better when she takes her vitamins) gets up 2-4/night. Pt states urine stream is strong sometimes but not always. No pain with urination. BOWEL FUNCTION: Constipation with pregnancy. Can go a whole week to 1.5 weeks in b/t BMs. Pt reported pain with BM occasionally. Pt does not take fiber supplements or stool softeners.  CORE STABILITY:  no hx of surgeries. Hx of fractured ribs approx. 8 years ago on L side. Pt reported some days she has neck and back pain during pregnancy. At worst: 7/10, at best: 0/10. Certain movements incr pain: how she sits or stands. Helps reduce pain: re-position. SEXUAL FUNCTION: pt reported pain with intercourse (rarely), feels like pelvis is spreading apart with deep penetration. No pain with OBGYN exam and does not use tampons-wears pads. Pt denied issues with climaxing.   Fluid intake: water  or juice (grape, hawaiian punch, calypso, juicy juice), rarely a soda, lately she drinks coffee in the morning (frapp)  FUNCTIONAL LIMITATIONS: unsure  PERTINENT HISTORY:  Medications for current condition: none Surgeries: none Other: Hx of substance abuse (cocaine and marijuana), asthma, GERD, HTN during pregnancy, hx of chlamydia, hx of gonorrhea,  depression (postpartum) but says it comes and goes Sexual abuse: No  PAIN:  Are you having pain? Yes NPRS scale: 7-8/10 B knees; 8-9/10 ache in hands (said it's not pain but ache) intermittent; worst with holding items or opening things. Coppertone gloves help. B knees, achy and intermittent worse with standing   PRECAUTIONS: None but hx of HTN last pregnancy  RED FLAGS: None   WEIGHT BEARING RESTRICTIONS: No  FALLS:  Has patient fallen in last 6 months? 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  ACTIVITY LEVEL : cleaning, cooking, stairs are hard (out of breath), stretching sometimes or squats  PLOF: Independent  PATIENT GOALS: Stop leaking, stop pain (movement is hard)   BOWEL MOVEMENT: Pain with bowel movement: Yes rarely Type of bowel movement:Frequency constipation, 1-1.5 weeks b/t BM Fully empty rectum: No Leakage: No                                                  Caused by:  Bowel urgency: no Pads: No Fiber supplement/laxative No  URINATION: Pain with urination: No Fully empty bladder: No                                         Post-void dribble: No Stream: sometimes strong and weak Urgency: Yes  Frequency:1-2/hour                                                        Nocturia: Yes: 2-4x/night   Leakage: Urge to void, Coughing, Sneezing, Laughing, and Lifting Pads/briefs: No  INTERCOURSE:  Ability to have vaginal penetration Yes  Pain with intercourse: Deep Penetration Dryness: unsure Climax: no issues Marinoff Scale: 2/3  PREGNANCY: Number of pregnancies: 7 Vaginal deliveries 4 (13, 11, 9, 3)  2 boys and 2 girls Tearing No Episiotomy No C-section deliveries: 0  Currently pregnant Yes: 17 weeks on 12/22/23  PROLAPSE: None   OBJECTIVE:  Note: Objective measures were completed at Evaluation unless otherwise noted.   COGNITION: Overall cognitive status: Within functional limits for tasks assessed     SENSATION: Light touch: Appears  intact   FUNCTIONAL TESTS:   Single leg stance:  Rt: incr. Postural sway  Lt: incr. Postural sway   GAIT: Assistive device utilized: None Comments: incr. BOS and decr. Trunk rot  POSTURE: increased lumbar lordosis, decreased thoracic kyphosis, and anterior pelvic tilt   LUMBARAROM/PROM:   A/PROM A/PROM  Eval (% available)  Flexion WNL  Extension WNL felt good  Right lateral flexion WNL  Left lateral flexion WNL  Right rotation Limited by 25%  Left rotation Limited by 25%   (Blank rows = not tested)  LOWER EXTREMITY ROM: all WNL except for limited B hip IR/ER (especially IR)  Active ROM Right eval Left eval  Hip flexion    Hip extension    Hip abduction    Hip adduction    Hip internal rotation    Hip external rotation    Knee flexion    Knee extension    Ankle dorsiflexion    Ankle plantarflexion    Ankle inversion    Ankle eversion     (Blank rows = not tested)  LOWER EXTREMITY MMT:  MMT Right eval Left eval  Hip flexion 4+ 4+  Hip extension    Hip abduction 3+ 3+  Hip adduction 3+ 4-  Hip internal rotation limited range with groin pain on R side 2 2  Hip external rotation 4 4  Knee flexion 4+ 4+  Knee extension 5 5  Ankle dorsiflexion 5 5  Ankle plantarflexion    Ankle inversion    Ankle eversion     (Blank rows = not tested) PALPATION:  General: no TTP during palpation of cx-lx spine in sidelying or of glutes/coccyx.   Pelvic Alignment: ant. Pelvic tilt   All internal or external pelvic floor assessments and/or treatments are completed with proper hand hygiene and gloves hands. If needed gloves are changed with hand hygiene during patient care time. Cues for coordination of PFM with breath, appears to be pushing vs. Contracting.  PELVIC MMT:   MMT eval  Vaginal   Internal Anal Sphincter   External Anal Sphincter   Puborectalis   (Blank rows = not tested)        TONE:   PROLAPSE: none   TODAY'S TREATMENT:                                                                                                                               DATE: 01/02/24    Physical function test: PT completed exam (palpation, MMT, DR, ROM). See above for details.  NMR: Access Code: [email or web address] URL: [email or web address] Date: 01/02/2024 Prepared by: Delon Pinal  Exercises - Sidelying Diaphragmatic Breathing  - 1 x daily - 7 x weekly - 1 sets - 5 reps - Sidelying Open Book  - 1 x daily - 7 x weekly - 1 sets - 10 reps Cues and demo for proper technique. S for safety. No pain.   SELF CARE:  PATIENT EDUCATION:  Education details: PT educated pt on wearing a night time brace for CTS symptoms and to notify OBGYN of s/s. Gave pt her wrist measurements to order brace. Educated pt on exam findings and downregulating CNS to improve leakage and stress.    ~Urinating every 2-3 hours ~Urine stream for at least 8 seconds. ~Urine color should be light, lemonade  ~If you have the urge, perform deep breathing for 5 reps and then 5-10 reps of pelvic floor contraction.  ~When do you feel the urge? ~When do you leak? Person educated: Patient Education method: Explanation, Demonstration, and Handouts Education comprehension: verbalized understanding and needs further education  HOME EXERCISE PROGRAM: [email or web address]  ASSESSMENT:  CLINICAL IMPRESSION: Skilled session focused on completing exam (difficulty coordinating breath with PFM contraction-bearing down with exhale, decr. Rib translation with inhale, TTP none and hypomobility tx spine). Pt progressed to performing HEP quickly. PT provided pt with CTS brace to decr. Wrist pain. The following impairments were noted upon exam: limited ROM likely 2/2 pregnancy, back/neck pain, postural dysfunction, decr. Strength, nocturia, SUI and rarely urgency incontinence. Pt would continue to benefit from skilled PT to improve safety and decr. Pain during all ADLs.   OBJECTIVE IMPAIRMENTS:  Abnormal gait, decreased balance, decreased coordination, decreased endurance, decreased mobility, decreased ROM, decreased strength, hypomobility, increased fascial restrictions, impaired flexibility, improper body mechanics, postural dysfunction, and pain.   ACTIVITY LIMITATIONS: carrying, lifting, bending, sitting, standing, squatting, sleeping, stairs, transfers, bed mobility, continence, toileting, dressing, locomotion level, and caring for others  PARTICIPATION LIMITATIONS: meal prep, cleaning, laundry, interpersonal relationship, shopping, and community activity  PERSONAL FACTORS: Past/current experiences and 3+ comorbidities: see above are also affecting patient's functional outcome.   REHAB POTENTIAL: Good  CLINICAL DECISION MAKING: Evolving/moderate complexity hx of HTN during pregnancy  EVALUATION COMPLEXITY: Moderate   GOALS: Goals reviewed with patient? Yes  SHORT TERM GOALS: Target date: for all STGs: 01/17/24   Pt will be IND in HEP to improve pain, strength, coordination. Baseline: no HEP Goal status: INITIAL  2.  Finish exam and write goals as indicated. Baseline: limited by time constraints Goal status: MET  3.  Pt will demo proper toileting posture to fully empty bladder and reduce straining during bowel movement. Baseline: unable to demo Goal status: INITIAL  4.  Pt will demonstrated improved relaxation and contraction of PFM with coordination of breath to reduce urinary leakage to </=twice/week. Baseline: approx. 3 times/week Goal status: INITIAL  5.   Pt will improve hip and core strength to decr. Back pain to </=4/10 at worst when standing for >10 minutes. Baseline: 7/10 at worst Goal status: INITIAL    LONG TERM GOALS: Target date: for all LTGs: 02/14/24  Pt will demonstrated improved relaxation and contraction of PFM with coordination of breath to reduce urinary leakage to </=once/week. Baseline: approx. 3 times/week Goal status: INITIAL  2.  Pt  will demonstrate improved bladder behaviors and improved coordination of PFM with breath to decr. Nocturia for </=1/night. Baseline: 2-4x/night Goal status: INITIAL  3.   Pt will improve hip and core strength to decr. Back pain to </=2/10 at worst when standing for >10 minutes. Baseline: 7/10 at worst  Goal status: INITIAL  4.  Pt will demonstrate improved relaxation and contraction of pelvic floor muscles (PFM) with coordination of breath to decr. Pain with intercourse with partner. Baseline: pain with deep penetration Goal status: INITIAL    PLAN: review HEP and begin pregnancy mobility/strength.   PT FREQUENCY: 1x/week  PT DURATION: 8 weeks  PLANNED INTERVENTIONS: 97110-Therapeutic exercises, 97530- Therapeutic activity, [id number]- Neuromuscular re-education, 97535- Self Care, [id number]- Manual therapy, and Patient/Family education    Harjot Zavadil L, PT 01/02/2024, 10:21 AM  Delon Pinal, PT,DPT 01/02/24 10:21 AM Phone: [phone] Fax: [phone]

## 2024-01-02 NOTE — Patient Instructions (Addendum)
 Carpal Tunnel Wrist Brace Support with 2 Straps and Metal Splint Stabilizer - Helps Relieve Tendinitis Arthritis Carpal Tunnel Pain - Reduces Recovery Time for Men Women -- R wrist: 6.3 inches and L wrist is 6.5 inches- S/M size is best  Bladder diary: ~Urinating every 2-3 hours ~Urine stream for at least 8 seconds. ~Urine color should be light, lemonade  ~If you have the urge, perform deep breathing for 5 reps and then 5-10 reps of pelvic floor contraction.  ~When do you feel the urge? ~When do you leak?

## 2024-01-09 ENCOUNTER — Other Ambulatory Visit: Payer: Self-pay

## 2024-01-09 NOTE — Progress Notes (Unsigned)
    Return Prenatal Note   Subjective   38 y.o. [id number] at [redacted]w[redacted]d presents for this follow-up prenatal visit.  Patient continues to experience heartburn despite pepcid  & tums. Reports increased need for albuterol  & is out of zofran . Wonders if she can use Flonase  & saline spray for dry sinuses. Reports pelvic pressure, does not have support belt. Patient reports: Movement: Present Contractions: Not present  Objective   Flow sheet Vitals: Pulse Rate: 74 BP: 106/75 Fetal Heart Rate (bpm): 150 Total weight gain: 15 lb 1.6 oz (6.849 kg)  General Appearance  No acute distress, well appearing, and well nourished Pulmonary   Normal work of breathing Neurologic   Alert and oriented to person, place, and time Psychiatric   Mood and affect within normal limits   Assessment/Plan   Plan  38 y.o. H2E5975 at [redacted]w[redacted]d presents for follow-up OB visit. Reviewed prenatal record including previous visit note.  Mild intermittent asthma without complication Flonase  & saline nasal spray prescribed. Has albuterol  refills.  GERD (gastroesophageal reflux disease) Pantoprazole  20mg  prescribed as Pepcid  is ineffective.  Supervision of other normal pregnancy, antepartum Red flag symptoms & how to contact provider reviewed. Preliminary ultrasound reviewed, normal anatomy, posterior placenta. Prescriptions provided for Zofran , PNV. Support belt order options through insurance sent via MyChart.      Orders Placed This Encounter  Procedures   Flu vaccine trivalent PF, 6mos and older(Flulaval,Afluria,Fluarix,Fluzone)   No follow-ups on file.   Future Appointments  Date Time Provider Department Center  01/16/2024 11:45 AM Cleotilde Delon CROME, PT [facility] None  02/06/2024 10:15 AM Cleotilde Delon CROME, PT [facility] None  02/07/2024 10:55 AM Slaughterbeck, Damien, CNM [facility] None  02/13/2024 11:45 AM Cleotilde Delon CROME, PT [facility] None  02/20/2024 11:45 AM Cleotilde Delon CROME, PT [facility] None   02/27/2024 11:45 AM Cleotilde Delon CROME, PT [facility] None  03/05/2024 11:45 AM Cleotilde Delon CROME, PT [facility] None  03/12/2024 11:45 AM Cleotilde Delon CROME, PT [facility] None    For next visit:  continue with routine prenatal care     Harlene CROME Cisco, CNM  11/25/251:20 PM

## 2024-01-09 NOTE — Patient Instructions (Incomplete)
 Second Trimester of Pregnancy  The second trimester of pregnancy is from week 14 through week 27. This is months 4 through 6 of pregnancy. During the second trimester: Morning sickness is less or has stopped. You may have more energy. You may feel hungry more often. At this time, your unborn baby is growing very fast. At the end of the sixth month, the unborn baby may be up to 12 inches long and weigh about 1 pounds. You will likely start to feel the baby move between 16 and 20 weeks of pregnancy. Body changes during your second trimester Your body continues to change during this time. The changes usually go away after your baby is born. Physical changes You will gain more weight. Your belly will get bigger. You may begin to get stretch marks on your hips, belly, and breasts. Your breasts will keep growing and may hurt. You may get dark spots or blotches on your face. A dark line from your belly button to the pubic area may appear. This line is called linea nigra. Your hair may grow faster and get thicker. Health changes You may have headaches. You may have heartburn. You may pee more often. You may have swollen, bulging veins (varicose veins). You may have trouble pooping (constipation), or swollen veins in the butt that can itch or get painful (hemorrhoids). You may have back pain. This is caused by: Weight gain. Pregnancy hormones that are relaxing the joints in your pelvis. Follow these instructions at home: Medicines Talk to your health care provider if you're taking medicines. Ask if the medicines are safe to take during pregnancy. Your provider may change the medicines that you take. Do not take any medicines unless told to by your provider. Take a prenatal vitamin that has at least 600 micrograms (mcg) of folic acid. Do not use herbal medicines, illegal drugs, or medicines that are not approved by your provider. Eating and drinking While you're pregnant your body needs  extra food for your growing baby. Talk with your provider about what to eat while pregnant. Activity Most women are able to exercise during pregnancy. Exercises may need to change as your pregnancy goes on. Talk to your provider about your activities and exercise routines. Relieving pain and discomfort Wear a good, supportive bra if your breasts hurt. Rest with your legs raised if you have leg cramps or low back pain. Take warm sitz baths to soothe pain from hemorrhoids. Use hemorrhoid cream if your provider says it's okay. Do not douche. Do not use tampons or scented pads. Do not use hot tubs, steam rooms, or saunas. Safety Wear your seatbelt at all times when you're in a car. Talk to your provider if someone hits you, hurts you, or yells at you. Talk with your provider if you're feeling sad or have thoughts of hurting yourself. Lifestyle Certain things can be harmful while you're pregnant. It's best to avoid the following: Do not drink alcohol,smoke, vape, or use products with nicotine or tobacco in them. If you need help quitting, talk with your provider. Avoid cat litter boxes and soil used by cats. These things carry germs that can cause harm to your pregnancy and your baby. General instructions Keep all follow-up visits. It helps you and your unborn baby stay as healthy as possible. Write down your questions. Take them to your prenatal visits. Your provider will: Talk with you about your overall health. Give you advice or refer you to specialists who can help with different needs,  including: Prenatal education classes. Mental health and counseling. Foods and healthy eating. Ask for help if you need help with food. Where to find more information American Pregnancy Association: americanpregnancy.org [employer]: acog.org Office on [employer] Health: [employer] Contact a health care provider if: You have a headache that does not go away  when you take medicine. You have any of these problems: You can't eat or drink. You throw up or feel like you may throw up. You have watery poop (diarrhea) for 2 days or more. You have pain when you pee or your pee smells bad. You have been sick for 2 days or more and are not getting better. Contact your provider right away if: You have any of these coming from your vagina: Abnormal discharge. Bad-smelling fluid. Bleeding. Your baby is moving less than usual. You have contractions, belly cramping, or have pain in your pelvis or lower back. You have symptoms of high blood pressure or preeclampsia. These include: A severe, throbbing headache that does not go away. Sudden or extreme swelling of your face, hands, legs, or feet. Vision problems: You see spots. You have blurry vision. Your eyes are sensitive to light. If you can't reach the provider, go to an urgent care or emergency room. Get help right away if: You faint, become confused, or can't think clearly. You have chest pain or trouble breathing. You have any kind of injury, such as from a fall or a car crash. These symptoms may be an emergency. Call 911 right away. Do not wait to see if the symptoms will go away. Do not drive yourself to the hospital. This information is not intended to replace advice given to you by your health care provider. Make sure you discuss any questions you have with your health care provider. Document Revised: 11/04/2022 Document Reviewed: 06/04/2022 Elsevier Patient Education  2024 [employer].

## 2024-01-10 ENCOUNTER — Encounter: Payer: Self-pay | Admitting: Certified Nurse Midwife

## 2024-01-10 ENCOUNTER — Ambulatory Visit

## 2024-01-10 ENCOUNTER — Ambulatory Visit: Admitting: Certified Nurse Midwife

## 2024-01-10 VITALS — BP 106/75 | HR 74 | Wt 161.1 lb

## 2024-01-10 DIAGNOSIS — F1411 Cocaine abuse, in remission: Secondary | ICD-10-CM

## 2024-01-10 DIAGNOSIS — O99512 Diseases of the respiratory system complicating pregnancy, second trimester: Secondary | ICD-10-CM

## 2024-01-10 DIAGNOSIS — O09292 Supervision of pregnancy with other poor reproductive or obstetric history, second trimester: Secondary | ICD-10-CM | POA: Diagnosis not present

## 2024-01-10 DIAGNOSIS — Z3A19 19 weeks gestation of pregnancy: Secondary | ICD-10-CM

## 2024-01-10 DIAGNOSIS — J4520 Mild intermittent asthma, uncomplicated: Secondary | ICD-10-CM

## 2024-01-10 DIAGNOSIS — Z3A18 18 weeks gestation of pregnancy: Secondary | ICD-10-CM | POA: Diagnosis not present

## 2024-01-10 DIAGNOSIS — Z3480 Encounter for supervision of other normal pregnancy, unspecified trimester: Secondary | ICD-10-CM

## 2024-01-10 DIAGNOSIS — F1911 Other psychoactive substance abuse, in remission: Secondary | ICD-10-CM | POA: Diagnosis not present

## 2024-01-10 DIAGNOSIS — O99282 Endocrine, nutritional and metabolic diseases complicating pregnancy, second trimester: Secondary | ICD-10-CM | POA: Diagnosis not present

## 2024-01-10 DIAGNOSIS — Z23 Encounter for immunization: Secondary | ICD-10-CM

## 2024-01-10 DIAGNOSIS — Z87891 Personal history of nicotine dependence: Secondary | ICD-10-CM

## 2024-01-10 DIAGNOSIS — K219 Gastro-esophageal reflux disease without esophagitis: Secondary | ICD-10-CM

## 2024-01-10 DIAGNOSIS — Z3689 Encounter for other specified antenatal screening: Secondary | ICD-10-CM

## 2024-01-10 DIAGNOSIS — O09299 Supervision of pregnancy with other poor reproductive or obstetric history, unspecified trimester: Secondary | ICD-10-CM

## 2024-01-10 DIAGNOSIS — Z3482 Encounter for supervision of other normal pregnancy, second trimester: Secondary | ICD-10-CM

## 2024-01-10 DIAGNOSIS — Z3201 Encounter for pregnancy test, result positive: Secondary | ICD-10-CM

## 2024-01-10 MED ORDER — ONDANSETRON 4 MG PO TBDP: 4.0000 mg | ORAL_TABLET | Freq: Three times a day (TID) | ORAL | 3 refills | Status: AC | PRN

## 2024-01-10 MED ORDER — PRENATAL 27-0.8 MG PO TABS: 1.0000 | ORAL_TABLET | Freq: Every day | ORAL | 3 refills | Status: AC

## 2024-01-10 MED ORDER — PANTOPRAZOLE SODIUM 20 MG PO TBEC: 20.0000 mg | DELAYED_RELEASE_TABLET | Freq: Every day | ORAL | 1 refills | Status: DC

## 2024-01-10 MED ORDER — FLUTICASONE PROPIONATE 50 MCG/ACT NA SUSP: 2.0000 | Freq: Every day | NASAL | 2 refills | Status: AC

## 2024-01-10 MED ORDER — SALINE SPRAY 0.65 % NA SOLN: 1.0000 | NASAL | 3 refills | Status: AC | PRN

## 2024-01-10 NOTE — Assessment & Plan Note (Signed)
 Red flag symptoms & how to contact provider reviewed. Preliminary ultrasound reviewed, normal anatomy, posterior placenta. Prescriptions provided for Zofran , PNV. Support belt order options through insurance sent via MyChart.

## 2024-01-10 NOTE — Assessment & Plan Note (Signed)
 Flonase  & saline nasal spray prescribed. Has albuterol  refills.

## 2024-01-10 NOTE — Assessment & Plan Note (Signed)
 Pantoprazole  20mg  prescribed as Pepcid  is ineffective.

## 2024-01-16 ENCOUNTER — Ambulatory Visit: Attending: Obstetrics

## 2024-01-19 ENCOUNTER — Ambulatory Visit

## 2024-01-20 ENCOUNTER — Other Ambulatory Visit: Payer: Self-pay | Admitting: Obstetrics

## 2024-01-20 ENCOUNTER — Ambulatory Visit: Payer: Self-pay | Admitting: Obstetrics

## 2024-01-20 DIAGNOSIS — Z3689 Encounter for other specified antenatal screening: Secondary | ICD-10-CM

## 2024-01-20 NOTE — Progress Notes (Signed)
 Growth US  ordered for 3rd trimester

## 2024-02-06 ENCOUNTER — Ambulatory Visit

## 2024-02-07 ENCOUNTER — Ambulatory Visit: Admitting: Certified Nurse Midwife

## 2024-02-07 ENCOUNTER — Encounter: Payer: Self-pay | Admitting: Certified Nurse Midwife

## 2024-02-07 VITALS — BP 121/75 | HR 92 | Wt 169.8 lb

## 2024-02-07 DIAGNOSIS — O99512 Diseases of the respiratory system complicating pregnancy, second trimester: Secondary | ICD-10-CM

## 2024-02-07 DIAGNOSIS — Z3482 Encounter for supervision of other normal pregnancy, second trimester: Secondary | ICD-10-CM

## 2024-02-07 DIAGNOSIS — Z130 Encounter for screening for diseases of the blood and blood-forming organs and certain disorders involving the immune mechanism: Secondary | ICD-10-CM

## 2024-02-07 DIAGNOSIS — J4520 Mild intermittent asthma, uncomplicated: Secondary | ICD-10-CM

## 2024-02-07 DIAGNOSIS — Z3480 Encounter for supervision of other normal pregnancy, unspecified trimester: Secondary | ICD-10-CM

## 2024-02-07 DIAGNOSIS — Z114 Encounter for screening for human immunodeficiency virus [HIV]: Secondary | ICD-10-CM

## 2024-02-07 DIAGNOSIS — Z3A23 23 weeks gestation of pregnancy: Secondary | ICD-10-CM

## 2024-02-07 DIAGNOSIS — Z131 Encounter for screening for diabetes mellitus: Secondary | ICD-10-CM

## 2024-02-07 MED ORDER — MOMETASONE FURO-FORMOTEROL FUM 100-5 MCG/ACT IN AERO: 2.0000 | INHALATION_SPRAY | Freq: Two times a day (BID) | RESPIRATORY_TRACT | 0 refills | Status: AC

## 2024-02-07 NOTE — Assessment & Plan Note (Signed)
 Reports a fall two weeks ago where she hit the back of her head. She feels recovered now.  Has had a cold for a few weeks and today presents with increased pressure in her right ear that is making her jaw hurt intermittently. She has been afebrile. Unable to assess her ear today due to missing equipment. Recommended urgent care if it worsens or with fever.   Reviewed red flag warning signs anticipatory guidance for upcoming prenatal care.

## 2024-02-07 NOTE — Assessment & Plan Note (Signed)
 Reports worsening asthma symptoms. She now needs to use her albuterol  inhaler once or twice a day. Will begin to treat with a daily long acting inhaler.

## 2024-02-07 NOTE — Patient Instructions (Signed)
 Second Trimester of Pregnancy  The second trimester of pregnancy is from week 14 through week 27. This is months 4 through 6 of pregnancy. During the second trimester: Morning sickness is less or has stopped. You may have more energy. You may feel hungry more often. At this time, your unborn baby is growing very fast. At the end of the sixth month, the unborn baby may be up to 12 inches long and weigh about 1 pounds. You will likely start to feel the baby move between 16 and 20 weeks of pregnancy. Body changes during your second trimester Your body continues to change during this time. The changes usually go away after your baby is born. Physical changes You will gain more weight. Your belly will get bigger. You may begin to get stretch marks on your hips, belly, and breasts. Your breasts will keep growing and may hurt. You may get dark spots or blotches on your face. A dark line from your belly button to the pubic area may appear. This line is called linea nigra. Your hair may grow faster and get thicker. Health changes You may have headaches. You may have heartburn. You may pee more often. You may have swollen, bulging veins (varicose veins). You may have trouble pooping (constipation), or swollen veins in the butt that can itch or get painful (hemorrhoids). You may have back pain. This is caused by: Weight gain. Pregnancy hormones that are relaxing the joints in your pelvis. Follow these instructions at home: Medicines Talk to your health care provider if you're taking medicines. Ask if the medicines are safe to take during pregnancy. Your provider may change the medicines that you take. Do not take any medicines unless told to by your provider. Take a prenatal vitamin that has at least 600 micrograms (mcg) of folic acid. Do not use herbal medicines, illegal drugs, or medicines that are not approved by your provider. Eating and drinking While you're pregnant your body needs  extra food for your growing baby. Talk with your provider about what to eat while pregnant. Activity Most women are able to exercise during pregnancy. Exercises may need to change as your pregnancy goes on. Talk to your provider about your activities and exercise routines. Relieving pain and discomfort Wear a good, supportive bra if your breasts hurt. Rest with your legs raised if you have leg cramps or low back pain. Take warm sitz baths to soothe pain from hemorrhoids. Use hemorrhoid cream if your provider says it's okay. Do not douche. Do not use tampons or scented pads. Do not use hot tubs, steam rooms, or saunas. Safety Wear your seatbelt at all times when you're in a car. Talk to your provider if someone hits you, hurts you, or yells at you. Talk with your provider if you're feeling sad or have thoughts of hurting yourself. Lifestyle Certain things can be harmful while you're pregnant. It's best to avoid the following: Do not drink alcohol,smoke, vape, or use products with nicotine or tobacco in them. If you need help quitting, talk with your provider. Avoid cat litter boxes and soil used by cats. These things carry germs that can cause harm to your pregnancy and your baby. General instructions Keep all follow-up visits. It helps you and your unborn baby stay as healthy as possible. Write down your questions. Take them to your prenatal visits. Your provider will: Talk with you about your overall health. Give you advice or refer you to specialists who can help with different needs,  including: Prenatal education classes. Mental health and counseling. Foods and healthy eating. Ask for help if you need help with food. Where to find more information American Pregnancy Association: americanpregnancy.org [employer]: acog.org Office on [employer] Health: [place] Contact a health care provider if: You have a headache that does not go away  when you take medicine. You have any of these problems: You can't eat or drink. You throw up or feel like you may throw up. You have watery poop (diarrhea) for 2 days or more. You have pain when you pee or your pee smells bad. You have been sick for 2 days or more and are not getting better. Contact your provider right away if: You have any of these coming from your vagina: Abnormal discharge. Bad-smelling fluid. Bleeding. Your baby is moving less than usual. You have contractions, belly cramping, or have pain in your pelvis or lower back. You have symptoms of high blood pressure or preeclampsia. These include: A severe, throbbing headache that does not go away. Sudden or extreme swelling of your face, hands, legs, or feet. Vision problems: You see spots. You have blurry vision. Your eyes are sensitive to light. If you can't reach the provider, go to an urgent care or emergency room. Get help right away if: You faint, become confused, or can't think clearly. You have chest pain or trouble breathing. You have any kind of injury, such as from a fall or a car crash. These symptoms may be an emergency. Call 911 right away. Do not wait to see if the symptoms will go away. Do not drive yourself to the hospital. This information is not intended to replace advice given to you by your health care provider. Make sure you discuss any questions you have with your health care provider.  Fetal Movement Counts When you're pregnant, you might start feeling your baby move around the middle of your pregnancy. At first, these movements might feel like flutters, rolls, or swishes. As your baby grows, you might feel more kicks and jabs. Around week 28 of your pregnancy, your health care team may ask you to count how often your baby moves. This is important for all pregnancies, but especially for high-risk ones. Counting movements can help lessen the risk of stillbirth. What is a fetal movement count? A  fetal movement count is the number of times that you feel your baby move during a certain amount of time. This may also be called a kick count. There are many ways to do a kick count. Ask your team what is best for you. Pay attention to when your baby is most active. You may notice your baby's sleep and wake cycles. You may also notice things that make your baby move more. When you do a kick count, try to do it: When your baby is normally most active. At the same time each day. How do I count fetal movements?  Find a quiet, comfortable area. Sit or lie down. Write down the date, the start time, and the number of movements you feel. Count kicks, flutters, swishes, rolls, and jabs. Usually, you will feel at least 10 movements within 2 hours. Stop counting after you have felt 10 movements or if you have been counting for 2 hours. Write down the stop time. Contact a health care provider if: You don't feel 10 movements in 2 hours. Your baby isn't moving as it usually does. Your baby isn't moving at all. If you're not able  to reach your provider, go to an emergency room. This information is not intended to replace advice given to you by your health care provider. Make sure you discuss any questions you have with your health care provider. Document Revised: 02/25/2023 Document Reviewed: 02/17/2022 Elsevier Patient Education  2025 [employer].

## 2024-02-07 NOTE — Progress Notes (Signed)
" ° ° °  Return Prenatal Note   Subjective   38 y.o. H2E5975 at [redacted]w[redacted]d presents for this follow-up prenatal visit.  Patient is doing well. She did have a fall and hurt her head. She reports good fetal movement. She has some pain in her right ear and some occasional headaches a few days after her fall. She also notes the further that she gets along in her pregnancy the harder it is for her to breath. Patient reports: Movement: Present Contractions: Irritability  Objective   Flow sheet Vitals: Pulse Rate: 92 BP: 121/75 Fundal Height: 24 cm Fetal Heart Rate (bpm): 140 Total weight gain: 23 lb 12.8 oz (10.8 kg)  General Appearance  No acute distress, well appearing, and well nourished Pulmonary   Normal work of breathing Neurologic   Alert and oriented to person, place, and time Psychiatric   Mood and affect within normal limits   Assessment/Plan   Plan  38 y.o. H2E5975 at [redacted]w[redacted]d presents for follow-up OB visit. Reviewed prenatal record including previous visit note.  Supervision of other normal pregnancy, antepartum Reports a fall two weeks ago where she hit the back of her head. She feels recovered now.  Has had a cold for a few weeks and today presents with increased pressure in her right ear that is making her jaw hurt intermittently. She has been afebrile. Unable to assess her ear today due to missing equipment. Recommended urgent care if it worsens or with fever.   Reviewed red flag warning signs anticipatory guidance for upcoming prenatal care.    Mild intermittent asthma without complication Reports worsening asthma symptoms. She now needs to use her albuterol  inhaler once or twice a day. Will begin to treat with a daily long acting inhaler.       Orders Placed This Encounter  Procedures   28 Week RH+Panel    Standing Status:   Future    Expected Date:   03/06/2024    Expiration Date:   06/04/2024   No follow-ups on file.   Future Appointments  Date Time Provider  Department Center  02/13/2024 11:45 AM Cleotilde Nest L, PT [facility] None  02/20/2024 11:45 AM Cleotilde Nest CROME, PT [facility] None  02/27/2024 11:45 AM Cleotilde Nest CROME, PT [facility] None  03/05/2024 11:45 AM Cleotilde Nest CROME, PT [facility] None  03/06/2024  9:00 AM [facility] None  03/06/2024  9:35 AM Dominic, Jinnie Jansky, CNM [facility] None  03/12/2024 11:45 AM Cleotilde Nest CROME, PT [facility] None    For next visit:  continue with routine prenatal care     Damien Parsley, CNM  [facility] [place] 12/23/202511:40 AM "

## 2024-02-13 ENCOUNTER — Ambulatory Visit

## 2024-02-20 ENCOUNTER — Ambulatory Visit

## 2024-02-27 ENCOUNTER — Ambulatory Visit: Attending: Obstetrics

## 2024-03-01 ENCOUNTER — Ambulatory Visit

## 2024-03-05 ENCOUNTER — Ambulatory Visit

## 2024-03-05 NOTE — Progress Notes (Unsigned)
" ° ° °  Return Prenatal Note   Subjective   39 y.o. H2E5975 at [redacted]w[redacted]d presents for this follow-up prenatal visit.  Here with her 21 y/o daughter   She states she is currently taking her Zofran  every eight hours and says it has helped but does not last long enough. She smokes marijuana occasionally and believes it helps with nausea and helps her cope. She states her acid reflux is worse now but only take the protonix  once daily. Reports lots of braxton hicks and movement, denies vaginal bleeding or leaking fluid. She is still undecided about breastfeeding, but may try it in the beginning.  Movement: Present Contractions: Irritability  Objective   Flow sheet Vitals: Pulse Rate: 98 BP: 112/77 Fundal Height: 26 cm Fetal Heart Rate (bpm): 152 Total weight gain: 23 lb 14.4 oz (10.8 kg)  General Appearance  No acute distress, well appearing, and well nourished Pulmonary   Normal work of breathing Neurologic   Alert and oriented to person, place, and time Psychiatric   Mood and affect within normal limits   Assessment/Plan   Plan  39 y.o. H2E5975 at [redacted]w[redacted]d presents for follow-up OB visit. Reviewed prenatal record including previous visit note. No concerns present today. Educated on red flag warning signs for preeclampsia including severe headache that doesn't go away with tylenol , blurry vision, or seeing stars.   Supervision of other normal pregnancy, antepartum No concerns present today. Educated on red flag warning signs for preeclampsia including severe headache that doesn't go away with tylenol , blurry vision, or seeing stars.  -28wks labs collected -TDAP given  -Would like to try to breastfeed, tried for a short time with 2 of her babies but stopped d/t pain. Discussed what to expect at the hospital in regards to skin to skin and lactation  support.  -Will increase Protonix  to BID -TWG 23 lbs, tends to gain a lot in her pregnancies, eats fairly balanced diet, rec increasing physical  activity  -Phenergan  sent to pharmacy       Orders Placed This Encounter  Procedures   Tdap vaccine greater than or equal to 7yo IM   Return in about 3 weeks (around 03/27/2024).   Future Appointments  Date Time Provider Department Center  03/27/2024  9:35 AM Sandrika Schwinn, Jinnie Jansky, CNM [facility] None     For next visit:  continue with routine prenatal care     JINNIE HERO [facility], CNM  03/06/2610:38 PM  "

## 2024-03-06 ENCOUNTER — Encounter: Payer: Self-pay | Admitting: Licensed Practical Nurse

## 2024-03-06 ENCOUNTER — Ambulatory Visit (INDEPENDENT_AMBULATORY_CARE_PROVIDER_SITE_OTHER): Admitting: Licensed Practical Nurse

## 2024-03-06 ENCOUNTER — Other Ambulatory Visit

## 2024-03-06 VITALS — BP 112/77 | HR 98 | Wt 169.9 lb

## 2024-03-06 DIAGNOSIS — Z3482 Encounter for supervision of other normal pregnancy, second trimester: Secondary | ICD-10-CM

## 2024-03-06 DIAGNOSIS — O219 Vomiting of pregnancy, unspecified: Secondary | ICD-10-CM

## 2024-03-06 DIAGNOSIS — Z23 Encounter for immunization: Secondary | ICD-10-CM | POA: Diagnosis not present

## 2024-03-06 DIAGNOSIS — Z114 Encounter for screening for human immunodeficiency virus [HIV]: Secondary | ICD-10-CM

## 2024-03-06 DIAGNOSIS — Z130 Encounter for screening for diseases of the blood and blood-forming organs and certain disorders involving the immune mechanism: Secondary | ICD-10-CM

## 2024-03-06 DIAGNOSIS — Z3A27 27 weeks gestation of pregnancy: Secondary | ICD-10-CM

## 2024-03-06 DIAGNOSIS — Z131 Encounter for screening for diabetes mellitus: Secondary | ICD-10-CM

## 2024-03-06 DIAGNOSIS — Z3480 Encounter for supervision of other normal pregnancy, unspecified trimester: Secondary | ICD-10-CM

## 2024-03-06 MED ORDER — PANTOPRAZOLE SODIUM 20 MG PO TBEC: 20.0000 mg | DELAYED_RELEASE_TABLET | Freq: Two times a day (BID) | ORAL | 1 refills | Status: AC

## 2024-03-06 MED ORDER — PROMETHAZINE HCL 25 MG PO TABS: 25.0000 mg | ORAL_TABLET | Freq: Four times a day (QID) | ORAL | 3 refills | Status: AC | PRN

## 2024-03-06 NOTE — Assessment & Plan Note (Addendum)
 No concerns present today. Educated on red flag warning signs for preeclampsia including severe headache that doesn't go away with tylenol , blurry vision, or seeing stars.  -28wks labs collected -TDAP given  -Would like to try to breastfeed, tried for a short time with 2 of her babies but stopped d/t pain. Discussed what to expect at the hospital in regards to skin to skin and lactation  support.  -Will increase Protonix  to BID -TWG 23 lbs, tends to gain a lot in her pregnancies, eats fairly balanced diet, rec increasing physical activity  -Phenergan  sent to pharmacy

## 2024-03-08 ENCOUNTER — Ambulatory Visit

## 2024-03-08 LAB — 28 WEEK RH+PANEL
Basophils Absolute: 0 x10E3/uL (ref 0.0–0.2)
Basos: 0 %
EOS (ABSOLUTE): 0 x10E3/uL (ref 0.0–0.4)
Eos: 0 %
Gestational Diabetes Screen: 123 mg/dL (ref 70–139)
HIV Screen 4th Generation wRfx: NONREACTIVE
Hematocrit: 35.3 % (ref 34.0–46.6)
Hemoglobin: 11 g/dL — ABNORMAL LOW (ref 11.1–15.9)
Immature Grans (Abs): 0 x10E3/uL (ref 0.0–0.1)
Immature Granulocytes: 0 %
Lymphocytes Absolute: 1.9 x10E3/uL (ref 0.7–3.1)
Lymphs: 20 %
MCH: 27.3 pg (ref 26.6–33.0)
MCHC: 31.2 g/dL — ABNORMAL LOW (ref 31.5–35.7)
MCV: 88 fL (ref 79–97)
Monocytes Absolute: 0.4 x10E3/uL (ref 0.1–0.9)
Monocytes: 5 %
Neutrophils Absolute: 6.8 x10E3/uL (ref 1.4–7.0)
Neutrophils: 75 %
Platelets: 223 x10E3/uL (ref 150–450)
RBC: 4.03 x10E6/uL (ref 3.77–5.28)
RDW: 13.3 % (ref 11.7–15.4)
RPR Ser Ql: REACTIVE — AB
WBC: 9.2 x10E3/uL (ref 3.4–10.8)

## 2024-03-08 LAB — RPR, QUANT+TP ABS (REFLEX)
Rapid Plasma Reagin, Quant: 1:1 {titer} — ABNORMAL HIGH
T Pallidum Abs: REACTIVE — AB

## 2024-03-09 ENCOUNTER — Ambulatory Visit: Payer: Self-pay | Admitting: Certified Nurse Midwife

## 2024-03-12 ENCOUNTER — Ambulatory Visit

## 2024-03-15 ENCOUNTER — Ambulatory Visit

## 2024-03-23 ENCOUNTER — Other Ambulatory Visit

## 2024-03-23 DIAGNOSIS — Z3689 Encounter for other specified antenatal screening: Secondary | ICD-10-CM

## 2024-03-27 ENCOUNTER — Encounter: Admitting: Licensed Practical Nurse
